# Patient Record
Sex: Female | Born: 1945
Health system: Southern US, Community
[De-identification: ages and names within clinical notes are randomized; demographics above are authoritative.]

## PROBLEM LIST (undated history)

## (undated) DIAGNOSIS — I10 Essential (primary) hypertension: Secondary | ICD-10-CM

## (undated) DIAGNOSIS — R112 Nausea with vomiting, unspecified: Secondary | ICD-10-CM

## (undated) DIAGNOSIS — E119 Type 2 diabetes mellitus without complications: Secondary | ICD-10-CM

## (undated) DIAGNOSIS — N3281 Overactive bladder: Secondary | ICD-10-CM

## (undated) DIAGNOSIS — E039 Hypothyroidism, unspecified: Secondary | ICD-10-CM

## (undated) DIAGNOSIS — E7800 Pure hypercholesterolemia, unspecified: Secondary | ICD-10-CM

## (undated) DIAGNOSIS — Z9889 Other specified postprocedural states: Secondary | ICD-10-CM

## (undated) HISTORY — PX: MASTECTOMY: SHX3

## (undated) HISTORY — PX: FOOT SURGERY: SHX648

## (undated) HISTORY — PX: ABDOMINAL HYSTERECTOMY: SHX81

---

## 1981-10-12 DIAGNOSIS — C801 Malignant (primary) neoplasm, unspecified: Secondary | ICD-10-CM

## 1981-10-12 HISTORY — DX: Malignant (primary) neoplasm, unspecified: C80.1

## 1999-11-26 ENCOUNTER — Other Ambulatory Visit: Admission: RE | Admit: 1999-11-26 | Discharge: 1999-11-26 | Payer: Self-pay | Admitting: Gynecology

## 2000-12-16 ENCOUNTER — Other Ambulatory Visit: Admission: RE | Admit: 2000-12-16 | Discharge: 2000-12-16 | Payer: Self-pay | Admitting: Gynecology

## 2002-06-29 ENCOUNTER — Other Ambulatory Visit: Admission: RE | Admit: 2002-06-29 | Discharge: 2002-06-29 | Payer: Self-pay | Admitting: Gynecology

## 2002-07-05 ENCOUNTER — Encounter: Admission: RE | Admit: 2002-07-05 | Discharge: 2002-10-03 | Payer: Self-pay | Admitting: Internal Medicine

## 2002-07-13 ENCOUNTER — Encounter: Payer: Self-pay | Admitting: Gynecology

## 2002-07-13 ENCOUNTER — Encounter: Admission: RE | Admit: 2002-07-13 | Discharge: 2002-07-13 | Payer: Self-pay | Admitting: Gynecology

## 2002-08-31 ENCOUNTER — Encounter: Admission: RE | Admit: 2002-08-31 | Discharge: 2002-11-29 | Payer: Self-pay | Admitting: Internal Medicine

## 2003-09-10 ENCOUNTER — Encounter: Admission: RE | Admit: 2003-09-10 | Discharge: 2003-12-09 | Payer: Self-pay | Admitting: Internal Medicine

## 2004-11-10 ENCOUNTER — Ambulatory Visit (HOSPITAL_COMMUNITY): Admission: RE | Admit: 2004-11-10 | Discharge: 2004-11-10 | Payer: Self-pay | Admitting: Family Medicine

## 2007-06-06 ENCOUNTER — Ambulatory Visit (HOSPITAL_COMMUNITY): Admission: RE | Admit: 2007-06-06 | Discharge: 2007-06-06 | Payer: Self-pay | Admitting: Family Medicine

## 2008-07-24 ENCOUNTER — Encounter: Admission: RE | Admit: 2008-07-24 | Discharge: 2008-07-24 | Payer: Self-pay | Admitting: Podiatry

## 2008-07-25 ENCOUNTER — Ambulatory Visit (HOSPITAL_BASED_OUTPATIENT_CLINIC_OR_DEPARTMENT_OTHER): Admission: RE | Admit: 2008-07-25 | Discharge: 2008-07-25 | Payer: Self-pay | Admitting: Podiatry

## 2008-08-01 ENCOUNTER — Ambulatory Visit (HOSPITAL_COMMUNITY): Admission: RE | Admit: 2008-08-01 | Discharge: 2008-08-01 | Payer: Self-pay | Admitting: Internal Medicine

## 2009-03-05 ENCOUNTER — Ambulatory Visit (HOSPITAL_COMMUNITY): Admission: RE | Admit: 2009-03-05 | Discharge: 2009-03-05 | Payer: Self-pay | Admitting: Family Medicine

## 2009-03-05 ENCOUNTER — Encounter (INDEPENDENT_AMBULATORY_CARE_PROVIDER_SITE_OTHER): Payer: Self-pay | Admitting: Family Medicine

## 2011-02-24 NOTE — Op Note (Signed)
[name], [name]                   ACCOUNT NO.:  [id number]   MEDICAL RECORD NO.:  [id number]          PATIENT TYPE:  AMB   LOCATION:  DSC                          FACILITY:  [facility]   PHYSICIAN:  Ezequiel Kayser. Ajlouny, D.P.M.DATE OF BIRTH:  1946-05-18   DATE OF PROCEDURE:  07/25/2008  DATE OF DISCHARGE:  07/25/2008                               OPERATIVE REPORT   SURGEON:  Ezequiel Kayser. Harriet Pho, DPM   ASSISTANTS:  1. Myeong O. Sheard, DPM  2. Kara Mead, DPM   PREOPERATIVE DIAGNOSES:  1. Hallux abductovalgus deformity with hypermobility, right foot.  2. Elongated and plantar flexed second metatarsal, right foot.  3. Hammertoe, second digit, right foot with the pre-dislocation      syndrome.   POSTOPERATIVE DIAGNOSES:  1. Hallux abductovalgus deformity with hypermobility, right foot.  2. Elongated and plantar flexed second metatarsal, right foot.  3. Hammertoe, second digit, right foot with the pre-dislocation      syndrome.   PROCEDURES:  1. Bunionectomy with Lapidus, right foot.  2. Second metatarsal Weil osteotomy with internal fixation.  3. Second hammertoe repair, right foot.   COMPLICATIONS:  None.   ANESTHESIA:  LMA.   COMPLICATIONS:  None.   SPECIMENS:  None.   DESCRIPTION OF THE PROCEDURE:  The patient was brought to the OR and  placed in the supine position, at which time general anesthesia was  administered.  A local block was performed.  Well-padded pneumatic ankle  tourniquet was applied superior to the medial malleolus.  The patient  was prepped and draped in the usual aseptic manner.  The foot was  exsanguinated with an Esmarch bandage and the previously applied  tourniquet inflated to 12 mmHg.   Attention was directed to the first ray where a dorsolinear incision was  made.  The incision was deepened via sharp and blunt modalities, taking  care to clip, cauterize all bleeding vessels and ensuring retraction of  all neurovascular structures encountered.  The deep  and superficial  fascia was separated medially and dorsally the length of the incision.  Attention was directed to the head of the first metatarsal where an  inverted L capsulotomy was performed, and once the head of the  metatarsal was exposed, the medial eminence was resected utilizing a  sagittal saw.  There was some mild hypertrophic bone lipping, which was  resected dorsally to the head of the first metatarsal.  There was mild  erosions at the head of the first metatarsal.  Next, attention was  directed to the first interspace where dissection was carried to the  level of the deep transmetatarsal ligament, which was resected and the  fibular sesamoid which preoperatively was in the interspace was resected  and excised in total.  The area was irrigated with copious amounts of  sterile saline and antibiotic solution.  Next, attention was directed to  the metatarsal cuneiform joint where the periosteum and capsule were  reflected.  Once adequate bone exposure had been obtained, the cartilage  was resected minimally off the distal end of the cuneiform and the base  of  the metatarsal to allow for transposition of the metatarsal.  This  was excised and the metatarsal was then realigned and fixated to correct  the increased in the metatarsal angle.  This was fixated using [facility] Lapidus plate and locking screws were used.  Fixation was found  to be adequate.  There were no gaps.  Surgical wound was irrigated with  copious amounts of sterile saline and antibiotic solution.  All rough  edges were smoothed with a power rasp.  Periosteum and capsule were  reapproximated using 3-0 Vicryl at the metatarsal cuneiform level and at  the first metatarsophalangeal joint level.  Deep closure was then  accomplished with 4-0 Vicryl and skin closure accomplished with 4-0  nylon.  Next, attention was directed to the second metatarsal where  incision was made dorsally over the proximal  interphalangeal joint and a  curvilinear incision extending over the metatarsophalangeal joint.  The  incision was deepened via sharp and blunt modalities, taking care to  clip, cauterize all bleeding vessels and ensuring retraction of all  neurovascular structures encountered.  The level of the proximal  interphalangeal joint of the extensor apparatus was released and the  head of the proximal phalanx exposed.  The head of the proximal phalanx  was resected using an oscillating saw and the cartilage off the base of  the medial middle phalanx was resected.  Deformity was still not  reducible and attention was directed to metatarsophalangeal joint where  the incision was extended exposing the head of the second metatarsal.  Next, a second metatarsal shortening osteotomy in an oblique fashion was  performed.  This was to elevate and slightly shorten the second  metatarsal.  Fixation was used using a 2-0 OsteoMed cannulated screw.  Fixation was found to be adequate.  The digit was then stabilized with a  0.045 K-wire just to the metatarsal head.  Intraoperative radiographs  were taken to confirm fixation.  Surgical wound was irrigated numerous  times throughout the procedure with sterile saline.  Deep closure was  accomplished with 3-0 and 4-0 Vicryl and skin closure was accomplished  with 4-0 and 5-0 nylon.  Postoperatively, 0.5% Marcaine plain and  dexamethasone phosphate was infiltrated around the surgical sites.  Foot  was dressed with Xeroform, 4 x 4s, Kling, and Coban.  The tourniquet was  deflated before it reached the 2-hour mark.  While the patient was still  in the recovery room, a short-leg posterior splint was applied.  Once  she was extubated, vital signs were stable and capillary refill time  were to presurgical levels.  No complications.  No guarantee is given.  She was instructed to remain completely nonweightbearing.  Postop  instructions given.  All questions  answered.           ______________________________  Ezequiel Kayser. Harriet Pho, D.P.M.     MJA/MEDQ  D:  08/20/2008  T:  08/20/2008  Job:  [id number]

## 2011-07-14 LAB — BASIC METABOLIC PANEL
BUN: 6
Chloride: 99
Creatinine, Ser: 0.63
GFR calc Af Amer: >60
GFR calc non Af Amer: >60
Glucose, Bld: 117 — ABNORMAL HIGH

## 2011-07-14 LAB — GLUCOSE, CAPILLARY

## 2011-10-21 DIAGNOSIS — H2510 Age-related nuclear cataract, unspecified eye: Secondary | ICD-10-CM | POA: Diagnosis not present

## 2011-10-28 DIAGNOSIS — H2510 Age-related nuclear cataract, unspecified eye: Secondary | ICD-10-CM | POA: Diagnosis not present

## 2011-10-29 DIAGNOSIS — H2510 Age-related nuclear cataract, unspecified eye: Secondary | ICD-10-CM | POA: Diagnosis not present

## 2011-11-04 DIAGNOSIS — H2510 Age-related nuclear cataract, unspecified eye: Secondary | ICD-10-CM | POA: Diagnosis not present

## 2012-01-21 DIAGNOSIS — D485 Neoplasm of uncertain behavior of skin: Secondary | ICD-10-CM | POA: Diagnosis not present

## 2012-01-21 DIAGNOSIS — L259 Unspecified contact dermatitis, unspecified cause: Secondary | ICD-10-CM | POA: Diagnosis not present

## 2012-01-21 DIAGNOSIS — L219 Seborrheic dermatitis, unspecified: Secondary | ICD-10-CM | POA: Diagnosis not present

## 2012-03-01 DIAGNOSIS — Z961 Presence of intraocular lens: Secondary | ICD-10-CM | POA: Diagnosis not present

## 2012-05-20 DIAGNOSIS — E119 Type 2 diabetes mellitus without complications: Secondary | ICD-10-CM | POA: Diagnosis not present

## 2012-05-20 DIAGNOSIS — E785 Hyperlipidemia, unspecified: Secondary | ICD-10-CM | POA: Diagnosis not present

## 2012-05-20 DIAGNOSIS — I10 Essential (primary) hypertension: Secondary | ICD-10-CM | POA: Diagnosis not present

## 2012-05-20 DIAGNOSIS — Z713 Dietary counseling and surveillance: Secondary | ICD-10-CM | POA: Diagnosis not present

## 2012-05-23 DIAGNOSIS — I10 Essential (primary) hypertension: Secondary | ICD-10-CM | POA: Diagnosis not present

## 2012-05-23 DIAGNOSIS — E782 Mixed hyperlipidemia: Secondary | ICD-10-CM | POA: Diagnosis not present

## 2012-08-03 DIAGNOSIS — Z23 Encounter for immunization: Secondary | ICD-10-CM | POA: Diagnosis not present

## 2012-11-14 DIAGNOSIS — N8184 Pelvic muscle wasting: Secondary | ICD-10-CM | POA: Diagnosis not present

## 2012-11-14 DIAGNOSIS — R350 Frequency of micturition: Secondary | ICD-10-CM | POA: Diagnosis not present

## 2012-11-14 DIAGNOSIS — Z01419 Encounter for gynecological examination (general) (routine) without abnormal findings: Secondary | ICD-10-CM | POA: Diagnosis not present

## 2012-12-28 DIAGNOSIS — Z1231 Encounter for screening mammogram for malignant neoplasm of breast: Secondary | ICD-10-CM | POA: Diagnosis not present

## 2013-01-03 DIAGNOSIS — M779 Enthesopathy, unspecified: Secondary | ICD-10-CM | POA: Diagnosis not present

## 2013-01-03 DIAGNOSIS — E1165 Type 2 diabetes mellitus with hyperglycemia: Secondary | ICD-10-CM | POA: Diagnosis not present

## 2013-01-03 DIAGNOSIS — Z6831 Body mass index (BMI) 31.0-31.9, adult: Secondary | ICD-10-CM | POA: Diagnosis not present

## 2013-01-03 DIAGNOSIS — N181 Chronic kidney disease, stage 1: Secondary | ICD-10-CM | POA: Diagnosis not present

## 2013-01-03 DIAGNOSIS — J309 Allergic rhinitis, unspecified: Secondary | ICD-10-CM | POA: Diagnosis not present

## 2013-03-28 DIAGNOSIS — Z961 Presence of intraocular lens: Secondary | ICD-10-CM | POA: Diagnosis not present

## 2013-06-26 DIAGNOSIS — I10 Essential (primary) hypertension: Secondary | ICD-10-CM | POA: Diagnosis not present

## 2013-06-26 DIAGNOSIS — Z6829 Body mass index (BMI) 29.0-29.9, adult: Secondary | ICD-10-CM | POA: Diagnosis not present

## 2013-06-26 DIAGNOSIS — Z23 Encounter for immunization: Secondary | ICD-10-CM | POA: Diagnosis not present

## 2013-06-26 DIAGNOSIS — E785 Hyperlipidemia, unspecified: Secondary | ICD-10-CM | POA: Diagnosis not present

## 2013-06-26 DIAGNOSIS — E119 Type 2 diabetes mellitus without complications: Secondary | ICD-10-CM | POA: Diagnosis not present

## 2013-12-26 DIAGNOSIS — Z6828 Body mass index (BMI) 28.0-28.9, adult: Secondary | ICD-10-CM | POA: Diagnosis not present

## 2013-12-26 DIAGNOSIS — E119 Type 2 diabetes mellitus without complications: Secondary | ICD-10-CM | POA: Diagnosis not present

## 2013-12-26 DIAGNOSIS — F411 Generalized anxiety disorder: Secondary | ICD-10-CM | POA: Diagnosis not present

## 2014-07-27 DIAGNOSIS — Z853 Personal history of malignant neoplasm of breast: Secondary | ICD-10-CM | POA: Diagnosis not present

## 2014-07-27 DIAGNOSIS — Z1231 Encounter for screening mammogram for malignant neoplasm of breast: Secondary | ICD-10-CM | POA: Diagnosis not present

## 2015-02-19 DIAGNOSIS — Z6829 Body mass index (BMI) 29.0-29.9, adult: Secondary | ICD-10-CM | POA: Diagnosis not present

## 2015-02-19 DIAGNOSIS — E782 Mixed hyperlipidemia: Secondary | ICD-10-CM | POA: Diagnosis not present

## 2015-02-19 DIAGNOSIS — E1165 Type 2 diabetes mellitus with hyperglycemia: Secondary | ICD-10-CM | POA: Diagnosis not present

## 2015-02-19 DIAGNOSIS — E669 Obesity, unspecified: Secondary | ICD-10-CM | POA: Diagnosis not present

## 2015-02-19 DIAGNOSIS — I10 Essential (primary) hypertension: Secondary | ICD-10-CM | POA: Diagnosis not present

## 2015-02-19 DIAGNOSIS — E663 Overweight: Secondary | ICD-10-CM | POA: Diagnosis not present

## 2015-05-27 DIAGNOSIS — Z961 Presence of intraocular lens: Secondary | ICD-10-CM | POA: Diagnosis not present

## 2015-05-27 DIAGNOSIS — E1165 Type 2 diabetes mellitus with hyperglycemia: Secondary | ICD-10-CM | POA: Diagnosis not present

## 2015-05-27 DIAGNOSIS — H26493 Other secondary cataract, bilateral: Secondary | ICD-10-CM | POA: Diagnosis not present

## 2015-06-28 DIAGNOSIS — Z961 Presence of intraocular lens: Secondary | ICD-10-CM | POA: Diagnosis not present

## 2015-06-28 DIAGNOSIS — H26493 Other secondary cataract, bilateral: Secondary | ICD-10-CM | POA: Diagnosis not present

## 2015-06-28 DIAGNOSIS — H26491 Other secondary cataract, right eye: Secondary | ICD-10-CM | POA: Diagnosis not present

## 2015-07-09 DIAGNOSIS — E782 Mixed hyperlipidemia: Secondary | ICD-10-CM | POA: Diagnosis not present

## 2015-07-09 DIAGNOSIS — Z6829 Body mass index (BMI) 29.0-29.9, adult: Secondary | ICD-10-CM | POA: Diagnosis not present

## 2015-07-09 DIAGNOSIS — J0100 Acute maxillary sinusitis, unspecified: Secondary | ICD-10-CM | POA: Diagnosis not present

## 2015-07-09 DIAGNOSIS — J209 Acute bronchitis, unspecified: Secondary | ICD-10-CM | POA: Diagnosis not present

## 2015-07-09 DIAGNOSIS — E663 Overweight: Secondary | ICD-10-CM | POA: Diagnosis not present

## 2015-07-09 DIAGNOSIS — Z23 Encounter for immunization: Secondary | ICD-10-CM | POA: Diagnosis not present

## 2015-07-09 DIAGNOSIS — E119 Type 2 diabetes mellitus without complications: Secondary | ICD-10-CM | POA: Diagnosis not present

## 2015-07-09 DIAGNOSIS — Z1389 Encounter for screening for other disorder: Secondary | ICD-10-CM | POA: Diagnosis not present

## 2015-07-09 DIAGNOSIS — I10 Essential (primary) hypertension: Secondary | ICD-10-CM | POA: Diagnosis not present

## 2015-07-09 DIAGNOSIS — E063 Autoimmune thyroiditis: Secondary | ICD-10-CM | POA: Diagnosis not present

## 2015-07-11 ENCOUNTER — Other Ambulatory Visit (HOSPITAL_COMMUNITY): Payer: Self-pay | Admitting: Internal Medicine

## 2015-07-11 DIAGNOSIS — R51 Headache: Principal | ICD-10-CM

## 2015-07-11 DIAGNOSIS — R519 Headache, unspecified: Secondary | ICD-10-CM

## 2015-07-16 ENCOUNTER — Other Ambulatory Visit (HOSPITAL_COMMUNITY): Payer: Self-pay

## 2015-07-16 DIAGNOSIS — H26492 Other secondary cataract, left eye: Secondary | ICD-10-CM | POA: Diagnosis not present

## 2015-07-17 ENCOUNTER — Ambulatory Visit (HOSPITAL_COMMUNITY)
Admission: RE | Admit: 2015-07-17 | Discharge: 2015-07-17 | Disposition: A | Discharge status: Home / Self Care | Payer: Medicare Other | Source: Ambulatory Visit | Attending: Internal Medicine | Admitting: Internal Medicine

## 2015-07-17 DIAGNOSIS — R51 Headache: Secondary | ICD-10-CM | POA: Diagnosis not present

## 2015-07-17 DIAGNOSIS — R519 Headache, unspecified: Secondary | ICD-10-CM

## 2015-09-12 DIAGNOSIS — Z1231 Encounter for screening mammogram for malignant neoplasm of breast: Secondary | ICD-10-CM | POA: Diagnosis not present

## 2015-12-23 DIAGNOSIS — E1165 Type 2 diabetes mellitus with hyperglycemia: Secondary | ICD-10-CM | POA: Diagnosis not present

## 2015-12-23 DIAGNOSIS — N181 Chronic kidney disease, stage 1: Secondary | ICD-10-CM | POA: Diagnosis not present

## 2015-12-23 DIAGNOSIS — I10 Essential (primary) hypertension: Secondary | ICD-10-CM | POA: Diagnosis not present

## 2015-12-23 DIAGNOSIS — E663 Overweight: Secondary | ICD-10-CM | POA: Diagnosis not present

## 2015-12-23 DIAGNOSIS — J029 Acute pharyngitis, unspecified: Secondary | ICD-10-CM | POA: Diagnosis not present

## 2015-12-23 DIAGNOSIS — E063 Autoimmune thyroiditis: Secondary | ICD-10-CM | POA: Diagnosis not present

## 2015-12-23 DIAGNOSIS — Z1389 Encounter for screening for other disorder: Secondary | ICD-10-CM | POA: Diagnosis not present

## 2015-12-23 DIAGNOSIS — E782 Mixed hyperlipidemia: Secondary | ICD-10-CM | POA: Diagnosis not present

## 2015-12-23 DIAGNOSIS — Z6826 Body mass index (BMI) 26.0-26.9, adult: Secondary | ICD-10-CM | POA: Diagnosis not present

## 2016-01-13 DIAGNOSIS — Z0000 Encounter for general adult medical examination without abnormal findings: Secondary | ICD-10-CM | POA: Diagnosis not present

## 2016-01-13 DIAGNOSIS — Z23 Encounter for immunization: Secondary | ICD-10-CM | POA: Diagnosis not present

## 2016-01-13 DIAGNOSIS — E119 Type 2 diabetes mellitus without complications: Secondary | ICD-10-CM | POA: Diagnosis not present

## 2016-01-13 DIAGNOSIS — Z1389 Encounter for screening for other disorder: Secondary | ICD-10-CM | POA: Diagnosis not present

## 2016-01-13 DIAGNOSIS — Z6825 Body mass index (BMI) 25.0-25.9, adult: Secondary | ICD-10-CM | POA: Diagnosis not present

## 2016-01-13 DIAGNOSIS — E663 Overweight: Secondary | ICD-10-CM | POA: Diagnosis not present

## 2016-02-01 DIAGNOSIS — J0100 Acute maxillary sinusitis, unspecified: Secondary | ICD-10-CM | POA: Diagnosis not present

## 2016-02-18 DIAGNOSIS — F329 Major depressive disorder, single episode, unspecified: Secondary | ICD-10-CM | POA: Diagnosis not present

## 2016-02-18 DIAGNOSIS — Z1389 Encounter for screening for other disorder: Secondary | ICD-10-CM | POA: Diagnosis not present

## 2016-02-18 DIAGNOSIS — Z6824 Body mass index (BMI) 24.0-24.9, adult: Secondary | ICD-10-CM | POA: Diagnosis not present

## 2016-02-18 DIAGNOSIS — F419 Anxiety disorder, unspecified: Secondary | ICD-10-CM | POA: Diagnosis not present

## 2016-03-12 DIAGNOSIS — Z1389 Encounter for screening for other disorder: Secondary | ICD-10-CM | POA: Diagnosis not present

## 2016-03-12 DIAGNOSIS — F329 Major depressive disorder, single episode, unspecified: Secondary | ICD-10-CM | POA: Diagnosis not present

## 2016-03-12 DIAGNOSIS — F419 Anxiety disorder, unspecified: Secondary | ICD-10-CM | POA: Diagnosis not present

## 2016-03-12 DIAGNOSIS — Z6824 Body mass index (BMI) 24.0-24.9, adult: Secondary | ICD-10-CM | POA: Diagnosis not present

## 2016-05-13 DIAGNOSIS — E039 Hypothyroidism, unspecified: Secondary | ICD-10-CM | POA: Diagnosis not present

## 2016-05-13 DIAGNOSIS — E1165 Type 2 diabetes mellitus with hyperglycemia: Secondary | ICD-10-CM | POA: Diagnosis not present

## 2016-05-13 DIAGNOSIS — Z6826 Body mass index (BMI) 26.0-26.9, adult: Secondary | ICD-10-CM | POA: Diagnosis not present

## 2016-05-13 DIAGNOSIS — E663 Overweight: Secondary | ICD-10-CM | POA: Diagnosis not present

## 2016-05-13 DIAGNOSIS — Z1389 Encounter for screening for other disorder: Secondary | ICD-10-CM | POA: Diagnosis not present

## 2016-05-13 DIAGNOSIS — E782 Mixed hyperlipidemia: Secondary | ICD-10-CM | POA: Diagnosis not present

## 2016-05-13 DIAGNOSIS — I10 Essential (primary) hypertension: Secondary | ICD-10-CM | POA: Diagnosis not present

## 2016-06-30 DIAGNOSIS — C50119 Malignant neoplasm of central portion of unspecified female breast: Secondary | ICD-10-CM | POA: Diagnosis not present

## 2016-06-30 DIAGNOSIS — Z1389 Encounter for screening for other disorder: Secondary | ICD-10-CM | POA: Diagnosis not present

## 2016-06-30 DIAGNOSIS — Z23 Encounter for immunization: Secondary | ICD-10-CM | POA: Diagnosis not present

## 2016-06-30 DIAGNOSIS — Z6827 Body mass index (BMI) 27.0-27.9, adult: Secondary | ICD-10-CM | POA: Diagnosis not present

## 2016-06-30 DIAGNOSIS — E663 Overweight: Secondary | ICD-10-CM | POA: Diagnosis not present

## 2016-06-30 DIAGNOSIS — E039 Hypothyroidism, unspecified: Secondary | ICD-10-CM | POA: Diagnosis not present

## 2016-07-10 DIAGNOSIS — Z1389 Encounter for screening for other disorder: Secondary | ICD-10-CM | POA: Diagnosis not present

## 2016-07-10 DIAGNOSIS — Z6826 Body mass index (BMI) 26.0-26.9, adult: Secondary | ICD-10-CM | POA: Diagnosis not present

## 2016-07-10 DIAGNOSIS — J209 Acute bronchitis, unspecified: Secondary | ICD-10-CM | POA: Diagnosis not present

## 2016-07-10 DIAGNOSIS — J069 Acute upper respiratory infection, unspecified: Secondary | ICD-10-CM | POA: Diagnosis not present

## 2016-08-03 DIAGNOSIS — H35033 Hypertensive retinopathy, bilateral: Secondary | ICD-10-CM | POA: Diagnosis not present

## 2016-08-03 DIAGNOSIS — Z961 Presence of intraocular lens: Secondary | ICD-10-CM | POA: Diagnosis not present

## 2016-08-03 DIAGNOSIS — H35362 Drusen (degenerative) of macula, left eye: Secondary | ICD-10-CM | POA: Diagnosis not present

## 2016-08-03 DIAGNOSIS — E119 Type 2 diabetes mellitus without complications: Secondary | ICD-10-CM | POA: Diagnosis not present

## 2016-08-31 DIAGNOSIS — C50119 Malignant neoplasm of central portion of unspecified female breast: Secondary | ICD-10-CM | POA: Diagnosis not present

## 2016-08-31 DIAGNOSIS — E039 Hypothyroidism, unspecified: Secondary | ICD-10-CM | POA: Diagnosis not present

## 2016-08-31 DIAGNOSIS — E663 Overweight: Secondary | ICD-10-CM | POA: Diagnosis not present

## 2016-08-31 DIAGNOSIS — Z1389 Encounter for screening for other disorder: Secondary | ICD-10-CM | POA: Diagnosis not present

## 2016-08-31 DIAGNOSIS — E1165 Type 2 diabetes mellitus with hyperglycemia: Secondary | ICD-10-CM | POA: Diagnosis not present

## 2016-08-31 DIAGNOSIS — Z6828 Body mass index (BMI) 28.0-28.9, adult: Secondary | ICD-10-CM | POA: Diagnosis not present

## 2016-08-31 DIAGNOSIS — I10 Essential (primary) hypertension: Secondary | ICD-10-CM | POA: Diagnosis not present

## 2016-08-31 DIAGNOSIS — F419 Anxiety disorder, unspecified: Secondary | ICD-10-CM | POA: Diagnosis not present

## 2016-09-15 DIAGNOSIS — Z1231 Encounter for screening mammogram for malignant neoplasm of breast: Secondary | ICD-10-CM | POA: Diagnosis not present

## 2016-11-01 DIAGNOSIS — J40 Bronchitis, not specified as acute or chronic: Secondary | ICD-10-CM | POA: Diagnosis not present

## 2016-11-01 DIAGNOSIS — R6889 Other general symptoms and signs: Secondary | ICD-10-CM | POA: Diagnosis not present

## 2016-12-30 DIAGNOSIS — E1165 Type 2 diabetes mellitus with hyperglycemia: Secondary | ICD-10-CM | POA: Diagnosis not present

## 2016-12-30 DIAGNOSIS — Z6829 Body mass index (BMI) 29.0-29.9, adult: Secondary | ICD-10-CM | POA: Diagnosis not present

## 2016-12-30 DIAGNOSIS — E039 Hypothyroidism, unspecified: Secondary | ICD-10-CM | POA: Diagnosis not present

## 2016-12-30 DIAGNOSIS — Z1389 Encounter for screening for other disorder: Secondary | ICD-10-CM | POA: Diagnosis not present

## 2016-12-30 DIAGNOSIS — E663 Overweight: Secondary | ICD-10-CM | POA: Diagnosis not present

## 2017-02-17 DIAGNOSIS — R070 Pain in throat: Secondary | ICD-10-CM | POA: Diagnosis not present

## 2017-02-17 DIAGNOSIS — J343 Hypertrophy of nasal turbinates: Secondary | ICD-10-CM | POA: Diagnosis not present

## 2017-02-17 DIAGNOSIS — J302 Other seasonal allergic rhinitis: Secondary | ICD-10-CM | POA: Diagnosis not present

## 2017-02-17 DIAGNOSIS — Z6830 Body mass index (BMI) 30.0-30.9, adult: Secondary | ICD-10-CM | POA: Diagnosis not present

## 2017-04-06 DIAGNOSIS — Z6829 Body mass index (BMI) 29.0-29.9, adult: Secondary | ICD-10-CM | POA: Diagnosis not present

## 2017-04-06 DIAGNOSIS — Z1389 Encounter for screening for other disorder: Secondary | ICD-10-CM | POA: Diagnosis not present

## 2017-04-06 DIAGNOSIS — Z0000 Encounter for general adult medical examination without abnormal findings: Secondary | ICD-10-CM | POA: Diagnosis not present

## 2017-05-25 DIAGNOSIS — I10 Essential (primary) hypertension: Secondary | ICD-10-CM | POA: Diagnosis not present

## 2017-05-25 DIAGNOSIS — E039 Hypothyroidism, unspecified: Secondary | ICD-10-CM | POA: Diagnosis not present

## 2017-05-25 DIAGNOSIS — Z6828 Body mass index (BMI) 28.0-28.9, adult: Secondary | ICD-10-CM | POA: Diagnosis not present

## 2017-05-25 DIAGNOSIS — E782 Mixed hyperlipidemia: Secondary | ICD-10-CM | POA: Diagnosis not present

## 2017-05-25 DIAGNOSIS — E663 Overweight: Secondary | ICD-10-CM | POA: Diagnosis not present

## 2017-05-25 DIAGNOSIS — F419 Anxiety disorder, unspecified: Secondary | ICD-10-CM | POA: Diagnosis not present

## 2017-05-25 DIAGNOSIS — E1165 Type 2 diabetes mellitus with hyperglycemia: Secondary | ICD-10-CM | POA: Diagnosis not present

## 2017-08-20 DIAGNOSIS — Z23 Encounter for immunization: Secondary | ICD-10-CM | POA: Diagnosis not present

## 2017-08-30 DIAGNOSIS — H35033 Hypertensive retinopathy, bilateral: Secondary | ICD-10-CM | POA: Diagnosis not present

## 2017-08-30 DIAGNOSIS — E119 Type 2 diabetes mellitus without complications: Secondary | ICD-10-CM | POA: Diagnosis not present

## 2017-08-30 DIAGNOSIS — E1165 Type 2 diabetes mellitus with hyperglycemia: Secondary | ICD-10-CM | POA: Diagnosis not present

## 2017-08-30 DIAGNOSIS — H35362 Drusen (degenerative) of macula, left eye: Secondary | ICD-10-CM | POA: Diagnosis not present

## 2017-08-30 DIAGNOSIS — H35371 Puckering of macula, right eye: Secondary | ICD-10-CM | POA: Diagnosis not present

## 2017-09-16 DIAGNOSIS — Z853 Personal history of malignant neoplasm of breast: Secondary | ICD-10-CM | POA: Diagnosis not present

## 2017-09-16 DIAGNOSIS — Z1231 Encounter for screening mammogram for malignant neoplasm of breast: Secondary | ICD-10-CM | POA: Diagnosis not present

## 2017-10-19 DIAGNOSIS — E1165 Type 2 diabetes mellitus with hyperglycemia: Secondary | ICD-10-CM | POA: Diagnosis not present

## 2017-10-19 DIAGNOSIS — E782 Mixed hyperlipidemia: Secondary | ICD-10-CM | POA: Diagnosis not present

## 2017-10-19 DIAGNOSIS — I10 Essential (primary) hypertension: Secondary | ICD-10-CM | POA: Diagnosis not present

## 2017-10-19 DIAGNOSIS — E039 Hypothyroidism, unspecified: Secondary | ICD-10-CM | POA: Diagnosis not present

## 2017-10-19 DIAGNOSIS — Z1389 Encounter for screening for other disorder: Secondary | ICD-10-CM | POA: Diagnosis not present

## 2017-10-19 DIAGNOSIS — Z6829 Body mass index (BMI) 29.0-29.9, adult: Secondary | ICD-10-CM | POA: Diagnosis not present

## 2017-10-19 DIAGNOSIS — E663 Overweight: Secondary | ICD-10-CM | POA: Diagnosis not present

## 2017-11-19 DIAGNOSIS — M1991 Primary osteoarthritis, unspecified site: Secondary | ICD-10-CM | POA: Diagnosis not present

## 2017-11-19 DIAGNOSIS — E663 Overweight: Secondary | ICD-10-CM | POA: Diagnosis not present

## 2017-11-19 DIAGNOSIS — Z6828 Body mass index (BMI) 28.0-28.9, adult: Secondary | ICD-10-CM | POA: Diagnosis not present

## 2017-11-19 DIAGNOSIS — M25561 Pain in right knee: Secondary | ICD-10-CM | POA: Diagnosis not present

## 2018-01-21 ENCOUNTER — Other Ambulatory Visit (HOSPITAL_COMMUNITY): Payer: Self-pay | Admitting: Family Medicine

## 2018-01-21 ENCOUNTER — Ambulatory Visit (HOSPITAL_COMMUNITY)
Admission: RE | Admit: 2018-01-21 | Discharge: 2018-01-21 | Disposition: A | Discharge status: Home / Self Care | Payer: Medicare Other | Source: Ambulatory Visit | Attending: Family Medicine | Admitting: Family Medicine

## 2018-01-21 DIAGNOSIS — M25561 Pain in right knee: Secondary | ICD-10-CM | POA: Diagnosis not present

## 2018-01-21 DIAGNOSIS — I10 Essential (primary) hypertension: Secondary | ICD-10-CM | POA: Insufficient documentation

## 2018-01-21 DIAGNOSIS — E1165 Type 2 diabetes mellitus with hyperglycemia: Secondary | ICD-10-CM | POA: Diagnosis not present

## 2018-01-21 DIAGNOSIS — Z1389 Encounter for screening for other disorder: Secondary | ICD-10-CM | POA: Diagnosis not present

## 2018-01-21 DIAGNOSIS — R109 Unspecified abdominal pain: Secondary | ICD-10-CM | POA: Insufficient documentation

## 2018-01-21 DIAGNOSIS — Z6829 Body mass index (BMI) 29.0-29.9, adult: Secondary | ICD-10-CM | POA: Diagnosis not present

## 2018-01-21 DIAGNOSIS — K224 Dyskinesia of esophagus: Secondary | ICD-10-CM | POA: Insufficient documentation

## 2018-01-27 DIAGNOSIS — M2031 Hallux varus (acquired), right foot: Secondary | ICD-10-CM | POA: Diagnosis not present

## 2018-01-31 ENCOUNTER — Other Ambulatory Visit: Payer: Self-pay

## 2018-01-31 ENCOUNTER — Ambulatory Visit (INDEPENDENT_AMBULATORY_CARE_PROVIDER_SITE_OTHER): Payer: Medicare Other | Admitting: Podiatry

## 2018-01-31 ENCOUNTER — Other Ambulatory Visit: Payer: Self-pay | Admitting: Podiatry

## 2018-01-31 ENCOUNTER — Ambulatory Visit (INDEPENDENT_AMBULATORY_CARE_PROVIDER_SITE_OTHER): Payer: Medicare Other

## 2018-01-31 DIAGNOSIS — M2031 Hallux varus (acquired), right foot: Secondary | ICD-10-CM

## 2018-01-31 DIAGNOSIS — M205X1 Other deformities of toe(s) (acquired), right foot: Secondary | ICD-10-CM | POA: Diagnosis not present

## 2018-01-31 DIAGNOSIS — M25871 Other specified joint disorders, right ankle and foot: Secondary | ICD-10-CM

## 2018-01-31 DIAGNOSIS — R52 Pain, unspecified: Secondary | ICD-10-CM

## 2018-01-31 NOTE — Patient Instructions (Signed)
Pre-Operative Instructions  Congratulations, you have decided to take an important step towards improving your quality of life.  You can be assured that the doctors and staff at [facility] will be with you every step of the way.  Here are some important things you should know:  1. Plan to be at the surgery center/hospital at least 1 (one) hour prior to your scheduled time, unless otherwise directed by the surgical center/hospital staff.  You must have a responsible adult accompany you, remain during the surgery and drive you home.  Make sure you have directions to the surgical center/hospital to ensure you arrive on time. 2. If you are having surgery at [facility] or [facility], you will need a copy of your medical history and physical form from your family physician within one month prior to the date of surgery. We will give you a form for your primary physician to complete.  3. We make every effort to accommodate the date you request for surgery.  However, there are times where surgery dates or times have to be moved.  We will contact you as soon as possible if a change in schedule is required.   4. No aspirin/ibuprofen for one week before surgery.  If you are on aspirin, any non-steroidal anti-inflammatory medications (Mobic, Aleve, Ibuprofen) should not be taken seven (7) days prior to your surgery.  You make take Tylenol for pain prior to surgery.  5. Medications - If you are taking daily heart and blood pressure medications, seizure, reflux, allergy, asthma, anxiety, pain or diabetes medications, make sure you notify the surgery center/hospital before the day of surgery so they can tell you which medications you should take or avoid the day of surgery. 6. No food or drink after midnight the night before surgery unless directed otherwise by surgical center/hospital staff. 7. No alcoholic beverages 24-hours prior to surgery.  No smoking 24-hours prior or 24-hours after  surgery. 8. Wear loose pants or shorts. They should be loose enough to fit over bandages, boots, and casts. 9. Don't wear slip-on shoes. Sneakers are preferred. 10. Bring your boot with you to the surgery center/hospital.  Also bring crutches or a walker if your physician has prescribed it for you.  If you do not have this equipment, it will be provided for you after surgery. 11. If you have not been contacted by the surgery center/hospital by the day before your surgery, call to confirm the date and time of your surgery. 12. Leave-time from work may vary depending on the type of surgery you have.  Appropriate arrangements should be made prior to surgery with your employer. 13. Prescriptions will be provided immediately following surgery by your doctor.  Fill these as soon as possible after surgery and take the medication as directed. Pain medications will not be refilled on weekends and must be approved by the doctor. 14. Remove nail polish on the operative foot and avoid getting pedicures prior to surgery. 15. Wash the night before surgery.  The night before surgery wash the foot and leg well with water and the antibacterial soap provided. Be sure to pay special attention to beneath the toenails and in between the toes.  Wash for at least three (3) minutes. Rinse thoroughly with water and dry well with a towel.  Perform this wash unless told not to do so by your physician.  Enclosed: 1 Ice pack (please put in freezer the night before surgery)   1 Hibiclens skin cleaner     Pre-op instructions  If you have any questions regarding the instructions, please do not hesitate to call our office.  [place]: [address], [place], [place] [address] -- [phone]  [place]: [address] [place], [place] [address] -- [phone]  [place]: [address]  [place], [place] [address] -- [phone]  [place]: [address], [place], [place] [address] -- [phone]  Website:  [email or web address] 

## 2018-01-31 NOTE — Progress Notes (Signed)
   HPI: 72 year old female referral from Dr. [facility] presents today for surgical consultation regarding an acquired hallux varus to the right foot.  Patient had a bunionectomy procedure performed to the right foot approximately 10 years ago by another local podiatrist.  She subsequently developed a right hallux varus deformity that is symptomatic and painful with certain shoe gear.  She also has deviation of digits 2 and 3 of the right foot.  She presents today for surgical consultation.  Conservative modalities are unsuccessful in providing any sort of satisfactory alleviation of symptoms and the patient  No past medical history on file.   Physical Exam: General: The patient is alert and oriented x3 in no acute distress.  Dermatology: Skin is warm, dry and supple bilateral lower extremities. Negative for open lesions or macerations.  Vascular: Palpable pedal pulses bilaterally. No edema or erythema noted. Capillary refill within normal limits.  Neurological: Epicritic and protective threshold grossly intact bilaterally.   Musculoskeletal Exam: Reducible hallux varus deformity noted with medial deviation of the great toe in relationship to the metatarsal at the level of the metatarsal phalangeal joint.  There is also medial deviation of digits 2 and 3 right foot with the apex of the deformity at the level of the MPJ.  Range of motion within normal limits to all pedal and ankle joints bilateral with exception of limited range of motion of the first MPJ.. Muscle strength 5/5 in all groups bilateral.   Radiographic Exam:  Normal osseous mineralization.  Medial deviation of the proximal phalanx in relationship to the metatarsal noted to the first second and third MTPJ's.  No significant osseous degeneration is noted. There also appears to be surgical excision or absence of the fibular sesamoid, which likely contributed to the hallux varus deformity.  Assessment: 1.  Reducible hallux  varus deformity right 2.  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 Patient evaluated. X-Rays reviewed.  2. Today we discussed the conservative versus surgical management of the presenting pathology. The patient opts for surgical management. All possible complications and details of the procedure were explained. All patient questions were answered. No guarantees were expressed or implied. 3. Authorization for surgery was initiated today. Surgery will consist of first MPJ arthroplasty with Silastic implant right.  2nd and 3rd MPJ capsulotomy right.  Possible R.O.H. Right 2nd metatarsal. Possible Weil decompression osteotomy 2nd and 3rd metatarsals RT.  4.  Return to clinic 1 week postop       Edrick Kins, DPM [facility]  Dr. Edrick Kins, DPM    [address], [place] [address]                Office [phone]  Fax [phone]

## 2018-02-02 ENCOUNTER — Telehealth: Payer: Self-pay | Admitting: *Deleted

## 2018-02-02 NOTE — Telephone Encounter (Signed)
I left the patient a message to call me back.  I need to change her surgery date, 02/24/2018 is not a good date for surgery.

## 2018-02-03 NOTE — Telephone Encounter (Signed)
"  I'm returning your call."  Yes, I called to let you know the date you were given for surgery is incorrect.   Dr. Amalia Hailey does not have enough time to do your surgery on 02/24/2018.  Is there another date we can reschedule it to?  "I can do it on May 2 or May 30."  He can do it on May 30.  Thank you so much.

## 2018-02-04 ENCOUNTER — Telehealth: Payer: Self-pay | Admitting: *Deleted

## 2018-02-04 NOTE — Telephone Encounter (Signed)
"  My mom, [name], is scheduled for surgery on May 30.  I need to change that because I will be the one transporting her.  I have a busy schedule with my kids on that day.  So does he have any time earlier in the month?"  He can do it on May 23 but that is the only date.  "Okay, go ahead and put her down for then."  I will take care of it.  I called Caren Griffins at [facility] to reschedule her surgery from 03/10/2018 to 03/03/2018.

## 2018-02-07 ENCOUNTER — Ambulatory Visit: Payer: Medicare Other | Admitting: Podiatry

## 2018-03-03 ENCOUNTER — Encounter: Payer: Self-pay | Admitting: Podiatry

## 2018-03-03 DIAGNOSIS — M7751 Other enthesopathy of right foot: Secondary | ICD-10-CM | POA: Diagnosis not present

## 2018-03-03 DIAGNOSIS — M2041 Other hammer toe(s) (acquired), right foot: Secondary | ICD-10-CM | POA: Diagnosis not present

## 2018-03-03 DIAGNOSIS — M25571 Pain in right ankle and joints of right foot: Secondary | ICD-10-CM | POA: Diagnosis not present

## 2018-03-03 DIAGNOSIS — M2011 Hallux valgus (acquired), right foot: Secondary | ICD-10-CM | POA: Diagnosis not present

## 2018-03-03 DIAGNOSIS — E7800 Pure hypercholesterolemia, unspecified: Secondary | ICD-10-CM | POA: Diagnosis not present

## 2018-03-04 ENCOUNTER — Encounter: Payer: Self-pay | Admitting: Podiatry

## 2018-03-09 ENCOUNTER — Ambulatory Visit (INDEPENDENT_AMBULATORY_CARE_PROVIDER_SITE_OTHER): Payer: Medicare Other | Admitting: Podiatry

## 2018-03-09 ENCOUNTER — Other Ambulatory Visit: Payer: Self-pay

## 2018-03-09 ENCOUNTER — Ambulatory Visit (INDEPENDENT_AMBULATORY_CARE_PROVIDER_SITE_OTHER): Payer: Medicare Other

## 2018-03-09 DIAGNOSIS — M7751 Other enthesopathy of right foot: Secondary | ICD-10-CM | POA: Diagnosis not present

## 2018-03-09 DIAGNOSIS — Z9889 Other specified postprocedural states: Secondary | ICD-10-CM

## 2018-03-13 NOTE — Progress Notes (Signed)
   Subjective:  Patient presents today status post 1st MPJ arthroplasty with implant and hammertoe repair of digits 2 and 3 of the right foot. DOS: 03/03/18. She states she is doing very well. She denies any pain or other complaints. Patient is here for further evaluation and treatment.    No past medical history on file.    Objective/Physical Exam Neurovascular status intact.  Skin incisions appear to be well coapted with sutures and staples intact. No sign of infectious process noted. No dehiscence. No active bleeding noted. Moderate edema noted to the surgical extremity.  Radiographic Exam:  Orthopedic hardware and osteotomies sites appear to be stable with routine healing.  Assessment: 1. s/p 1st MPJ arthroplasty with implant and hammertoe repair of digits 2 and 3 of the right foot. DOS: 03/03/18.   Plan of Care:  1. Patient was evaluated. X-rays reviewed 2. Dressing changed. Keep clean, dry and intact.  3. Continue weightbearing in CAM boot.  4. Return to clinic in one week.    Edrick Kins, DPM [facility]  Dr. Edrick Kins, [place]                                        [place], [place] [address]                Office [phone]  Fax [phone]

## 2018-03-16 ENCOUNTER — Ambulatory Visit (INDEPENDENT_AMBULATORY_CARE_PROVIDER_SITE_OTHER): Payer: Medicare Other | Admitting: Podiatry

## 2018-03-16 DIAGNOSIS — M2031 Hallux varus (acquired), right foot: Secondary | ICD-10-CM | POA: Diagnosis not present

## 2018-03-16 DIAGNOSIS — Z9889 Other specified postprocedural states: Secondary | ICD-10-CM

## 2018-03-20 NOTE — Progress Notes (Signed)
   Subjective:  Patient presents today status post 1st MPJ arthroplasty with implant and hammertoe repair of digits 2 and 3 of the right foot. DOS: 03/03/18. She states she is doing very well. She reports only minimal pain. She denies any other complaints at this time. Patient is here for further evaluation and treatment.    No past medical history on file.    Objective/Physical Exam Neurovascular status intact.  Skin incisions appear to be well coapted with sutures and staples intact. No sign of infectious process noted. No dehiscence. No active bleeding noted. Moderate edema noted to the surgical extremity.   Assessment: 1. s/p 1st MPJ arthroplasty with implant and hammertoe repair of digits 2 and 3 of the right foot. DOS: 03/03/18.   Plan of Care:  1. Patient was evaluated.  2. Staples removed.  3. Post op shoe dispensed. Discontinue wearing CAM boot.  4. Compression anklet dispensed.  5. Return to clinic in 2 weeks.    Edrick Kins, DPM [facility]  Dr. Edrick Kins, [place]                                        [place], [place] [address]                Office [phone]  Fax [phone]

## 2018-03-25 NOTE — Progress Notes (Signed)
DOS  03/04/2018  Keller bunion implant right. Capsulotomy MPJ release joint two and three right. Removal fixation deep kwire/screw right

## 2018-03-30 ENCOUNTER — Encounter: Payer: Medicare Other | Admitting: Podiatry

## 2018-04-01 DIAGNOSIS — Z6829 Body mass index (BMI) 29.0-29.9, adult: Secondary | ICD-10-CM | POA: Diagnosis not present

## 2018-04-01 DIAGNOSIS — Z1389 Encounter for screening for other disorder: Secondary | ICD-10-CM | POA: Diagnosis not present

## 2018-04-01 DIAGNOSIS — E663 Overweight: Secondary | ICD-10-CM | POA: Diagnosis not present

## 2018-04-01 DIAGNOSIS — Z0001 Encounter for general adult medical examination with abnormal findings: Secondary | ICD-10-CM | POA: Diagnosis not present

## 2018-04-04 ENCOUNTER — Ambulatory Visit (INDEPENDENT_AMBULATORY_CARE_PROVIDER_SITE_OTHER): Payer: Medicare Other

## 2018-04-04 ENCOUNTER — Ambulatory Visit (INDEPENDENT_AMBULATORY_CARE_PROVIDER_SITE_OTHER): Payer: Medicare Other | Admitting: Podiatry

## 2018-04-04 DIAGNOSIS — M2031 Hallux varus (acquired), right foot: Secondary | ICD-10-CM

## 2018-04-04 DIAGNOSIS — R1310 Dysphagia, unspecified: Secondary | ICD-10-CM | POA: Diagnosis not present

## 2018-04-04 DIAGNOSIS — Z9889 Other specified postprocedural states: Secondary | ICD-10-CM

## 2018-04-04 DIAGNOSIS — R0789 Other chest pain: Secondary | ICD-10-CM | POA: Diagnosis not present

## 2018-04-04 DIAGNOSIS — Z86010 Personal history of colonic polyps: Secondary | ICD-10-CM | POA: Insufficient documentation

## 2018-04-04 DIAGNOSIS — E119 Type 2 diabetes mellitus without complications: Secondary | ICD-10-CM | POA: Insufficient documentation

## 2018-04-04 DIAGNOSIS — E785 Hyperlipidemia, unspecified: Secondary | ICD-10-CM | POA: Insufficient documentation

## 2018-04-04 DIAGNOSIS — E039 Hypothyroidism, unspecified: Secondary | ICD-10-CM | POA: Insufficient documentation

## 2018-04-08 NOTE — Progress Notes (Signed)
   Subjective:  Patient presents today status post 1st MPJ arthroplasty with implant and hammertoe repair of digits 2 and 3 of the right foot. DOS: 03/03/18. She states she is improving. She has been wearing the post op shoe which helps with her symptoms. There are no modifying factors noted. Patient is here for further evaluation and treatment.    No past medical history on file.    Objective/Physical Exam Neurovascular status intact.  Skin incisions appear to be well coapted. No sign of infectious process noted. No dehiscence. No active bleeding noted. Moderate edema noted to the surgical extremity.  Radiographic Exam:  Orthopedic hardware and osteotomies sites appear to be stable with routine healing.  Assessment: 1. s/p 1st MPJ arthroplasty with implant and hammertoe repair of digits 2 and 3 of the right foot. DOS: 03/03/18.   Plan of Care:  1. Patient was evaluated. X-Rays reviewed.   2. Resume wearing good shoe gear.  3. Discontinue using post op shoe.  4. Return to clinic in 4 weeks.    Edrick Kins, DPM [facility]  Dr. Edrick Kins, [place]                                        [place], [place] [address]                Office [phone]  Fax [phone]

## 2018-04-28 DIAGNOSIS — Z1211 Encounter for screening for malignant neoplasm of colon: Secondary | ICD-10-CM | POA: Diagnosis not present

## 2018-04-28 DIAGNOSIS — K317 Polyp of stomach and duodenum: Secondary | ICD-10-CM | POA: Diagnosis not present

## 2018-04-28 DIAGNOSIS — K449 Diaphragmatic hernia without obstruction or gangrene: Secondary | ICD-10-CM | POA: Diagnosis not present

## 2018-04-28 DIAGNOSIS — R1310 Dysphagia, unspecified: Secondary | ICD-10-CM | POA: Diagnosis not present

## 2018-04-28 DIAGNOSIS — K228 Other specified diseases of esophagus: Secondary | ICD-10-CM | POA: Diagnosis not present

## 2018-04-28 DIAGNOSIS — D125 Benign neoplasm of sigmoid colon: Secondary | ICD-10-CM | POA: Diagnosis not present

## 2018-04-28 DIAGNOSIS — R0789 Other chest pain: Secondary | ICD-10-CM | POA: Diagnosis not present

## 2018-04-28 DIAGNOSIS — K5730 Diverticulosis of large intestine without perforation or abscess without bleeding: Secondary | ICD-10-CM | POA: Diagnosis not present

## 2018-04-28 DIAGNOSIS — K5790 Diverticulosis of intestine, part unspecified, without perforation or abscess without bleeding: Secondary | ICD-10-CM | POA: Insufficient documentation

## 2018-04-28 DIAGNOSIS — K648 Other hemorrhoids: Secondary | ICD-10-CM | POA: Insufficient documentation

## 2018-04-28 DIAGNOSIS — K219 Gastro-esophageal reflux disease without esophagitis: Secondary | ICD-10-CM | POA: Diagnosis not present

## 2018-04-28 DIAGNOSIS — K3189 Other diseases of stomach and duodenum: Secondary | ICD-10-CM | POA: Diagnosis not present

## 2018-04-28 DIAGNOSIS — D131 Benign neoplasm of stomach: Secondary | ICD-10-CM | POA: Diagnosis not present

## 2018-04-28 DIAGNOSIS — K210 Gastro-esophageal reflux disease with esophagitis: Secondary | ICD-10-CM | POA: Diagnosis not present

## 2018-04-28 DIAGNOSIS — Z86010 Personal history of colonic polyps: Secondary | ICD-10-CM | POA: Diagnosis not present

## 2018-04-28 DIAGNOSIS — D1339 Benign neoplasm of other parts of small intestine: Secondary | ICD-10-CM | POA: Diagnosis not present

## 2018-04-28 DIAGNOSIS — K635 Polyp of colon: Secondary | ICD-10-CM | POA: Diagnosis not present

## 2018-04-28 DIAGNOSIS — K209 Esophagitis, unspecified: Secondary | ICD-10-CM | POA: Diagnosis not present

## 2018-05-04 ENCOUNTER — Ambulatory Visit (INDEPENDENT_AMBULATORY_CARE_PROVIDER_SITE_OTHER): Payer: Medicare Other

## 2018-05-04 ENCOUNTER — Ambulatory Visit (INDEPENDENT_AMBULATORY_CARE_PROVIDER_SITE_OTHER): Payer: Medicare Other | Admitting: Podiatry

## 2018-05-04 DIAGNOSIS — M2031 Hallux varus (acquired), right foot: Secondary | ICD-10-CM

## 2018-05-04 DIAGNOSIS — L603 Nail dystrophy: Secondary | ICD-10-CM

## 2018-05-04 DIAGNOSIS — Z9889 Other specified postprocedural states: Secondary | ICD-10-CM

## 2018-05-07 NOTE — Progress Notes (Signed)
   Subjective:  Patient presents today status post 1st MPJ arthroplasty with implant and hammertoe repair of digits 2 and 3 of the right foot. DOS: 03/03/18. She states she is doing very well regarding her surgery. She has a new complaint of yellowing of the right great toenail. She denies pain or modifying factors. She is concerned for possible nail fungus. She has not done anything for treatment. Patient is here for further evaluation and treatment.    No past medical history on file.    Objective/Physical Exam Neurovascular status intact.  Skin incisions appear to be well coapted. No sign of infectious process noted. No dehiscence. No active bleeding noted. Moderate edema noted to the surgical extremity. Hyperkeratotic, discolored, thickened, onychodystrophy of the right great toenail.   Radiographic Exam:  Orthopedic hardware and osteotomies sites appear to be stable with routine healing.  Assessment: 1. s/p 1st MPJ arthroplasty with implant and hammertoe repair of digits 2 and 3 of the right foot. DOS: 03/03/18. 2. Dystrophic nail right hallux   Plan of Care:  1. Patient was evaluated. X-Rays reviewed.   2. May resume full activity with no restrictions.  3. Mechanical debridement of right great toenail performed using a nail nipper. Filed with dremel without incident.  4. Recommended good shoe gear.  5. Return to clinic as needed.    Edrick Kins, DPM [facility]  Dr. Edrick Kins, [address]                                        [place], [place] [address]                Office [phone]  Fax [phone]

## 2018-06-01 DIAGNOSIS — K2100 Gastro-esophageal reflux disease with esophagitis, without bleeding: Secondary | ICD-10-CM | POA: Insufficient documentation

## 2018-06-01 DIAGNOSIS — K648 Other hemorrhoids: Secondary | ICD-10-CM | POA: Diagnosis not present

## 2018-06-22 DIAGNOSIS — K648 Other hemorrhoids: Secondary | ICD-10-CM | POA: Diagnosis not present

## 2018-07-15 DIAGNOSIS — Z23 Encounter for immunization: Secondary | ICD-10-CM | POA: Diagnosis not present

## 2018-07-15 DIAGNOSIS — E039 Hypothyroidism, unspecified: Secondary | ICD-10-CM | POA: Diagnosis not present

## 2018-07-15 DIAGNOSIS — R32 Unspecified urinary incontinence: Secondary | ICD-10-CM | POA: Diagnosis not present

## 2018-07-15 DIAGNOSIS — I10 Essential (primary) hypertension: Secondary | ICD-10-CM | POA: Diagnosis not present

## 2018-07-15 DIAGNOSIS — E1165 Type 2 diabetes mellitus with hyperglycemia: Secondary | ICD-10-CM | POA: Diagnosis not present

## 2018-07-15 DIAGNOSIS — R350 Frequency of micturition: Secondary | ICD-10-CM | POA: Diagnosis not present

## 2018-07-15 DIAGNOSIS — Z1389 Encounter for screening for other disorder: Secondary | ICD-10-CM | POA: Diagnosis not present

## 2018-07-15 DIAGNOSIS — E782 Mixed hyperlipidemia: Secondary | ICD-10-CM | POA: Diagnosis not present

## 2018-07-15 DIAGNOSIS — Z6828 Body mass index (BMI) 28.0-28.9, adult: Secondary | ICD-10-CM | POA: Diagnosis not present

## 2018-07-15 DIAGNOSIS — M1711 Unilateral primary osteoarthritis, right knee: Secondary | ICD-10-CM | POA: Diagnosis not present

## 2018-07-15 DIAGNOSIS — E119 Type 2 diabetes mellitus without complications: Secondary | ICD-10-CM | POA: Diagnosis not present

## 2018-07-29 DIAGNOSIS — E663 Overweight: Secondary | ICD-10-CM | POA: Diagnosis not present

## 2018-07-29 DIAGNOSIS — J069 Acute upper respiratory infection, unspecified: Secondary | ICD-10-CM | POA: Diagnosis not present

## 2018-07-29 DIAGNOSIS — Z6828 Body mass index (BMI) 28.0-28.9, adult: Secondary | ICD-10-CM | POA: Diagnosis not present

## 2018-08-20 ENCOUNTER — Other Ambulatory Visit: Payer: Self-pay | Admitting: Podiatry

## 2018-09-12 DIAGNOSIS — H35033 Hypertensive retinopathy, bilateral: Secondary | ICD-10-CM | POA: Diagnosis not present

## 2018-09-12 DIAGNOSIS — H35363 Drusen (degenerative) of macula, bilateral: Secondary | ICD-10-CM | POA: Diagnosis not present

## 2018-09-12 DIAGNOSIS — H35371 Puckering of macula, right eye: Secondary | ICD-10-CM | POA: Diagnosis not present

## 2018-09-12 DIAGNOSIS — E119 Type 2 diabetes mellitus without complications: Secondary | ICD-10-CM | POA: Diagnosis not present

## 2018-11-07 DIAGNOSIS — N3941 Urge incontinence: Secondary | ICD-10-CM | POA: Diagnosis not present

## 2018-11-11 DIAGNOSIS — Z1231 Encounter for screening mammogram for malignant neoplasm of breast: Secondary | ICD-10-CM | POA: Diagnosis not present

## 2018-11-11 DIAGNOSIS — Z853 Personal history of malignant neoplasm of breast: Secondary | ICD-10-CM | POA: Diagnosis not present

## 2018-12-02 DIAGNOSIS — Z6826 Body mass index (BMI) 26.0-26.9, adult: Secondary | ICD-10-CM | POA: Diagnosis not present

## 2018-12-02 DIAGNOSIS — J00 Acute nasopharyngitis [common cold]: Secondary | ICD-10-CM | POA: Diagnosis not present

## 2018-12-16 DIAGNOSIS — N3941 Urge incontinence: Secondary | ICD-10-CM | POA: Diagnosis not present

## 2019-01-18 ENCOUNTER — Ambulatory Visit (INDEPENDENT_AMBULATORY_CARE_PROVIDER_SITE_OTHER): Payer: Medicare Other

## 2019-01-18 ENCOUNTER — Ambulatory Visit (INDEPENDENT_AMBULATORY_CARE_PROVIDER_SITE_OTHER): Payer: Medicare Other | Admitting: Podiatry

## 2019-01-18 ENCOUNTER — Other Ambulatory Visit: Payer: Self-pay

## 2019-01-18 DIAGNOSIS — M2041 Other hammer toe(s) (acquired), right foot: Secondary | ICD-10-CM | POA: Diagnosis not present

## 2019-01-18 DIAGNOSIS — M205X1 Other deformities of toe(s) (acquired), right foot: Secondary | ICD-10-CM | POA: Diagnosis not present

## 2019-01-18 DIAGNOSIS — M21271 Flexion deformity, right ankle and toes: Secondary | ICD-10-CM

## 2019-01-18 NOTE — Progress Notes (Signed)
   HPI: Patient presents for evaluation of right foot pain.  States that her second toe drifts underneath her right big toe.  Patient had surgery on 03/03/2017 at which time she underwent first MPJ arthroplasty with implant and hammertoe repair digits 2, 3 right.  She says that now her second toes drifting underneath her great toe and causing pain.  She presents for further treatment evaluation  No past medical history on file.   Physical Exam: General: The patient is alert and oriented x3 in no acute distress.  Dermatology: Skin is warm, dry and supple bilateral lower extremities. Negative for open lesions or macerations.  Vascular: Palpable pedal pulses bilaterally. No edema or erythema noted. Capillary refill within normal limits.  Neurological: Epicritic and protective threshold grossly intact bilaterally.   Musculoskeletal Exam: Range of motion within normal limits to all pedal and ankle joints bilateral. Muscle strength 5/5 in all groups bilateral.  With weightbearing and nonweightbearing there is medial deviation of the digits 2 and 3 of the right foot.  Right great toe is elevated and does not purchase the ground with weightbearing and allows for the second toe to drift underneath the great toe.  Limited range of motion also noted to the right great toe MPJ.  Radiographic Exam:  Normal osseous mineralization.  Orthopedic hardware and implant intact.  Medial deviation of digits 2-5 right foot.  Orthopedic hardware noted to the first met cuneiform joint from prior surgery.  Broken hardware but otherwise intact.  Assessment: 1.  Hallux limitus/elevatus right great toe 2. Medially deviated toes digits 2-5 right foot   Plan of Care:  1. Patient evaluated. X-Rays reviewed.  2.  With weightbearing it is evident that the underlying problem is that the great toe does not purchase the ground allowing for the second toe to medially deviate under the hallux. 3.  Manipulation of the MPJ  demonstrated limited range of motion.  Recommend that the patient manipulate the joint daily at home and specifically work on plantarflexion of the great toe to allow it to better purchase during weightbearing 4.  Darco toe splint dispensed to plantarflex the great toe during weightbearing 5.  Continue wearing good supportive shoe gear 6.  Return to clinic in 6 weeks      Edrick Kins, DPM [facility]  Dr. Edrick Kins, DPM    [address], [place] [address]                Office [phone]  Fax [phone]

## 2019-01-27 DIAGNOSIS — R351 Nocturia: Secondary | ICD-10-CM | POA: Diagnosis not present

## 2019-01-27 DIAGNOSIS — N3941 Urge incontinence: Secondary | ICD-10-CM | POA: Diagnosis not present

## 2019-03-01 ENCOUNTER — Ambulatory Visit (INDEPENDENT_AMBULATORY_CARE_PROVIDER_SITE_OTHER): Payer: Medicare Other | Admitting: Podiatry

## 2019-03-01 ENCOUNTER — Other Ambulatory Visit: Payer: Self-pay

## 2019-03-01 ENCOUNTER — Encounter: Payer: Self-pay | Admitting: Podiatry

## 2019-03-01 VITALS — Temp 97.5°F

## 2019-03-01 DIAGNOSIS — M21271 Flexion deformity, right ankle and toes: Secondary | ICD-10-CM

## 2019-03-01 DIAGNOSIS — M2041 Other hammer toe(s) (acquired), right foot: Secondary | ICD-10-CM

## 2019-03-06 NOTE — Progress Notes (Signed)
   HPI: 73 year old female presenting today for follow up evaluation of hallux limitus of the right great toe. She states the pain has improved. She states the Darco splint has not been helping. She does not note any modifying factors. Patient is here for further evaluation and treatment.   History reviewed. No pertinent past medical history.   Physical Exam: General: The patient is alert and oriented x3 in no acute distress.  Dermatology: Skin is warm, dry and supple bilateral lower extremities. Negative for open lesions or macerations.  Vascular: Palpable pedal pulses bilaterally. No edema or erythema noted. Capillary refill within normal limits.  Neurological: Epicritic and protective threshold grossly intact bilaterally.   Musculoskeletal Exam: Range of motion within normal limits to all pedal and ankle joints bilateral. Muscle strength 5/5 in all groups bilateral.  With weightbearing and nonweightbearing there is medial deviation of the digits 2 and 3 of the right foot.  Right great toe is elevated and does not purchase the ground with weightbearing and allows for the second toe to drift underneath the great toe.  Limited range of motion also noted to the right great toe MPJ.   Assessment: 1. Hallux limitus/elevatus right great toe 2. Medially deviated toes digits 2-5 right foot   Plan of Care:  1. Patient evaluated.  2. Continue plantar flexion of the great toe daily.  3. Recommended good shoe gear.  4. Continue using Darco toe splint as needed.  5. Return to clinic as needed.     Edrick Kins, DPM [facility]  Dr. Edrick Kins, DPM    [address], [place] [address]                Office [phone]  Fax [phone]

## 2019-04-28 DIAGNOSIS — Z1389 Encounter for screening for other disorder: Secondary | ICD-10-CM | POA: Diagnosis not present

## 2019-04-28 DIAGNOSIS — Z6828 Body mass index (BMI) 28.0-28.9, adult: Secondary | ICD-10-CM | POA: Diagnosis not present

## 2019-04-28 DIAGNOSIS — E663 Overweight: Secondary | ICD-10-CM | POA: Diagnosis not present

## 2019-04-28 DIAGNOSIS — Z0001 Encounter for general adult medical examination with abnormal findings: Secondary | ICD-10-CM | POA: Diagnosis not present

## 2019-04-28 DIAGNOSIS — E1165 Type 2 diabetes mellitus with hyperglycemia: Secondary | ICD-10-CM | POA: Diagnosis not present

## 2019-05-01 DIAGNOSIS — E1165 Type 2 diabetes mellitus with hyperglycemia: Secondary | ICD-10-CM | POA: Diagnosis not present

## 2019-05-15 DIAGNOSIS — M8589 Other specified disorders of bone density and structure, multiple sites: Secondary | ICD-10-CM | POA: Diagnosis not present

## 2019-05-15 DIAGNOSIS — Z780 Asymptomatic menopausal state: Secondary | ICD-10-CM | POA: Diagnosis not present

## 2019-05-15 DIAGNOSIS — Z853 Personal history of malignant neoplasm of breast: Secondary | ICD-10-CM | POA: Diagnosis not present

## 2019-05-15 DIAGNOSIS — R29890 Loss of height: Secondary | ICD-10-CM | POA: Diagnosis not present

## 2019-05-15 DIAGNOSIS — Z90710 Acquired absence of both cervix and uterus: Secondary | ICD-10-CM | POA: Diagnosis not present

## 2019-05-24 ENCOUNTER — Other Ambulatory Visit: Payer: Self-pay | Admitting: Podiatry

## 2019-08-04 DIAGNOSIS — N3941 Urge incontinence: Secondary | ICD-10-CM | POA: Diagnosis not present

## 2019-08-04 DIAGNOSIS — R351 Nocturia: Secondary | ICD-10-CM | POA: Diagnosis not present

## 2019-12-06 ENCOUNTER — Emergency Department (HOSPITAL_COMMUNITY)
Admission: EM | Admit: 2019-12-06 | Discharge: 2019-12-06 | Disposition: A | Discharge status: Home / Self Care | Payer: Medicare Other | Attending: Emergency Medicine | Admitting: Emergency Medicine

## 2019-12-06 ENCOUNTER — Encounter (HOSPITAL_COMMUNITY): Payer: Self-pay | Admitting: Emergency Medicine

## 2019-12-06 ENCOUNTER — Other Ambulatory Visit: Payer: Self-pay

## 2019-12-06 DIAGNOSIS — Y939 Activity, unspecified: Secondary | ICD-10-CM | POA: Insufficient documentation

## 2019-12-06 DIAGNOSIS — X58XXXA Exposure to other specified factors, initial encounter: Secondary | ICD-10-CM | POA: Diagnosis not present

## 2019-12-06 DIAGNOSIS — Y929 Unspecified place or not applicable: Secondary | ICD-10-CM | POA: Insufficient documentation

## 2019-12-06 DIAGNOSIS — Z7982 Long term (current) use of aspirin: Secondary | ICD-10-CM | POA: Insufficient documentation

## 2019-12-06 DIAGNOSIS — Y999 Unspecified external cause status: Secondary | ICD-10-CM | POA: Insufficient documentation

## 2019-12-06 DIAGNOSIS — S0181XA Laceration without foreign body of other part of head, initial encounter: Secondary | ICD-10-CM | POA: Insufficient documentation

## 2019-12-06 DIAGNOSIS — Z79899 Other long term (current) drug therapy: Secondary | ICD-10-CM | POA: Diagnosis not present

## 2019-12-06 DIAGNOSIS — Z7984 Long term (current) use of oral hypoglycemic drugs: Secondary | ICD-10-CM | POA: Diagnosis not present

## 2019-12-06 DIAGNOSIS — E039 Hypothyroidism, unspecified: Secondary | ICD-10-CM | POA: Diagnosis not present

## 2019-12-06 DIAGNOSIS — E119 Type 2 diabetes mellitus without complications: Secondary | ICD-10-CM | POA: Insufficient documentation

## 2019-12-06 NOTE — ED Provider Notes (Signed)
[facility] EMERGENCY DEPARTMENT Provider Note   CSN: [id number] Arrival date & time: 12/06/19  1548     History Chief Complaint  Patient presents with  . Laceration    Emma MUHLESTEIN is a 74 y.o. female.  The history is provided by the patient. No language interpreter was used.  Laceration Location:  Face Facial laceration location:  Face Length:  1 Depth:  Through dermis Pain details:    Quality:  Aching   Severity:  No pain   Progression:  Worsening Foreign body present:  No foreign bodies Relieved by:  Nothing Worsened by:  Nothing Tetanus status:  Up to date      History reviewed. No pertinent past medical history.  Patient Active Problem List   Diagnosis Date Noted  . Gastroesophageal reflux disease with esophagitis 06/01/2018  . Diverticulosis 04/28/2018  . Internal hemorrhoids 04/28/2018  . Diabetes (Circ[place]) 04/04/2018  . History of adenomatous polyp of colon 04/04/2018  . Hyperlipidemia 04/04/2018  . Hypothyroid 04/04/2018    History reviewed. No pertinent surgical history.   OB History    Gravida      Para      Term      Preterm      AB      Living  2     SAB      TAB      Ectopic      Multiple      Live Births              History reviewed. No pertinent family history.  Social History   Tobacco Use  . Smoking status: Never Smoker  . Smokeless tobacco: Never Used  Substance Use Topics  . Alcohol use: Never  . Drug use: Never    Home Medications Prior to Admission medications   Medication Sig Start Date End Date Taking? Authorizing Provider  ACCU-CHEK AVIVA PLUS test strip TEST QID 01/25/18   [provider]  ACCU-CHEK FASTCLIX LANCETS MISC U TO TEST QID 01/21/18   [provider]  aspirin 325 MG tablet Take 325 mg by mouth daily.    [provider]  Calcium Citrate (CITRACAL PO) Take by mouth.    [provider]  celecoxib (CELEBREX) 200 MG capsule TAKE 1 CAPSULE BY MOUTH ONCE DAILY  IF NEEDED 12/23/17   [provider]  Cholecalciferol (VITAMIN D PO) Take by mouth.    [provider]  Diclofenac Sodium 1.5 % SOLN APP EXT AA QID 01/21/18   [provider]  escitalopram (LEXAPRO) 10 MG tablet TK 1 T PO D 01/20/18   [provider]  glimepiride (AMARYL) 2 MG tablet TK 2 TS PO D 01/21/18   [provider]  hydrochlorothiazide (HYDRODIURIL) 25 MG tablet TK 1 T PO D 01/21/18   [provider]  levothyroxine (SYNTHROID, LEVOTHROID) 100 MCG tablet TK 1 T PO D 01/21/18   [provider]  losartan (COZAAR) 100 MG tablet  11/17/17   [provider]  meloxicam (MOBIC) 15 MG tablet TAKE 1 TABLET EVERY DAY 05/24/19   Edrick Kins, DPM  metFORMIN (GLUCOPHAGE) 500 MG tablet TK 1 T PO BID 12/31/17   [provider]  omeprazole (PRILOSEC) 20 MG capsule  01/21/18   [provider]  oxybutynin (DITROPAN XL) 15 MG 24 hr tablet TK 1 T PO D 01/21/18   [provider]  oxyCODONE-acetaminophen (PERCOCET/ROXICET) 5-325 MG tablet TAKE 1 TABLET EVERY 4 HOURS AS NEEDED  FOR OAIN 03/03/18   [provider]  simvastatin (ZOCOR) 40 MG tablet TK 1 T PO D 01/21/18   [provider]    Allergies    Patient has no known allergies.  Review of Systems   Review of Systems  All other systems reviewed and are negative.   Physical Exam Updated Vital Signs BP (!) 145/62   Pulse 66   Temp 98 F (36.7 C) (Oral)   Resp 17   Ht 5' (1.524 m)   Wt 67.6 kg   SpO2 99%   BMI 29.10 kg/m   Physical Exam Vitals and nursing note reviewed.  Constitutional:      Appearance: She is well-developed.  HENT:     Head: Normocephalic.  Cardiovascular:     Rate and Rhythm: Normal rate.  Pulmonary:     Effort: Pulmonary effort is normal.  Abdominal:     General: There is no distension.  Musculoskeletal:        General: Normal range of motion.     Cervical back: Normal range of motion.  Skin:    Comments:  1cm laceration   Neurological:     Mental Status: She is alert and oriented to person, place, and time.  Psychiatric:        Mood and Affect: Mood normal.     ED Results / Procedures / Treatments   Labs (all labs ordered are listed, but only abnormal results are displayed) Labs Reviewed - No data to display  EKG None  Radiology No results found.  Procedures .Marland KitchenLaceration Repair  Date/Time: 12/06/2019 6:53 PM Performed by: Fransico Meadow, PA-C Authorized by: Fransico Meadow, PA-C   Consent:    Consent obtained:  Verbal   Consent given by:  Patient   Risks discussed:  Infection, need for additional repair, pain, poor cosmetic result and poor wound healing   Alternatives discussed:  No treatment and delayed treatment Universal protocol:    Procedure explained and questions answered to patient or proxy's satisfaction: yes     Relevant documents present and verified: yes     Test results available and properly labeled: yes     Imaging studies available: yes     Required blood products, implants, devices, and special equipment available: yes     Site/side marked: yes     Immediately prior to procedure, a time out was called: yes     Patient identity confirmed:  Verbally with patient Exploration:    Wound extent: no foreign bodies/material noted   Treatment:    Area cleansed with:  Betadine   Irrigation solution:  Sterile saline Skin repair:    Repair method:  Tissue adhesive Post-procedure details:    Patient tolerance of procedure:  Tolerated well, no immediate complications   (including critical care time)  Medications Ordered in ED Medications - No data to display  ED Course  I have reviewed the triage vital signs and the nursing notes.  Pertinent labs & imaging results that were available during my care of the patient were reviewed by me and considered in my medical decision making (see chart for details).    MDM Rules/Calculators/A&P                        Final Clinical Impression(s) / ED Diagnoses Final diagnoses:  Laceration of forehead, initial encounter    Rx / DC Orders ED Discharge Orders    None    An After Visit  Summary was printed and given to the patient.    Fransico Meadow, Hershal Coria 12/06/19 Randol Kern    Fredia Sorrow, MD 12/08/19 [phone]
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
## 2019-12-06 NOTE — Discharge Instructions (Signed)
Return if any problems.

## 2020-01-22 DIAGNOSIS — E7849 Other hyperlipidemia: Secondary | ICD-10-CM | POA: Diagnosis not present

## 2020-01-22 DIAGNOSIS — Z1389 Encounter for screening for other disorder: Secondary | ICD-10-CM | POA: Diagnosis not present

## 2020-01-22 DIAGNOSIS — E663 Overweight: Secondary | ICD-10-CM | POA: Diagnosis not present

## 2020-01-22 DIAGNOSIS — Z6826 Body mass index (BMI) 26.0-26.9, adult: Secondary | ICD-10-CM | POA: Diagnosis not present

## 2020-01-22 DIAGNOSIS — I10 Essential (primary) hypertension: Secondary | ICD-10-CM | POA: Diagnosis not present

## 2020-01-22 DIAGNOSIS — E1165 Type 2 diabetes mellitus with hyperglycemia: Secondary | ICD-10-CM | POA: Diagnosis not present

## 2020-01-22 DIAGNOSIS — M1991 Primary osteoarthritis, unspecified site: Secondary | ICD-10-CM | POA: Diagnosis not present

## 2020-01-22 DIAGNOSIS — M1711 Unilateral primary osteoarthritis, right knee: Secondary | ICD-10-CM | POA: Diagnosis not present

## 2020-01-22 DIAGNOSIS — E039 Hypothyroidism, unspecified: Secondary | ICD-10-CM | POA: Diagnosis not present

## 2020-02-12 DIAGNOSIS — M25531 Pain in right wrist: Secondary | ICD-10-CM | POA: Diagnosis not present

## 2020-02-12 DIAGNOSIS — M25561 Pain in right knee: Secondary | ICD-10-CM | POA: Diagnosis not present

## 2020-03-13 DIAGNOSIS — Z1231 Encounter for screening mammogram for malignant neoplasm of breast: Secondary | ICD-10-CM | POA: Diagnosis not present

## 2020-04-01 ENCOUNTER — Ambulatory Visit (INDEPENDENT_AMBULATORY_CARE_PROVIDER_SITE_OTHER): Payer: Medicare Other

## 2020-04-01 ENCOUNTER — Other Ambulatory Visit: Payer: Self-pay

## 2020-04-01 ENCOUNTER — Ambulatory Visit (INDEPENDENT_AMBULATORY_CARE_PROVIDER_SITE_OTHER): Payer: Medicare Other | Admitting: Podiatry

## 2020-04-01 DIAGNOSIS — M2041 Other hammer toe(s) (acquired), right foot: Secondary | ICD-10-CM | POA: Diagnosis not present

## 2020-04-01 NOTE — Progress Notes (Signed)
   HPI: 74 y.o. female presenting today as an established patient for evaluation of deviation of the toes to the second and third toe of the right foot.  Patient has a history of multiple surgeries to the right foot.  Last surgery was the first MTPJ implant with hammertoe repair 2, 3 right foot on 03/03/2018.  Patient states that over the past year she has noticed that the second toe has deviated and is now sitting underneath her great toe.  This is causing pain with ambulation and shoe gear.  She presents for further treatment evaluation  No past medical history on file.   Physical Exam: General: The patient is alert and oriented x3 in no acute distress.  Dermatology: Skin is warm, dry and supple bilateral lower extremities. Negative for open lesions or macerations.  Vascular: Palpable pedal pulses bilaterally. No edema or erythema noted. Capillary refill within normal limits.  Neurological: Epicritic and protective threshold grossly intact bilaterally.   Musculoskeletal Exam: Range of motion within normal limits to all pedal and ankle joints bilateral. Muscle strength 5/5 in all groups bilateral.  There is some pain on palpation to the second and third toe as well as pain with range of motion noted.  Radiographic Exam:  Normal osseous mineralization.  First MTPJ implant appears stable and intact right foot.  There is also an implanted screw to the head of the second metatarsal which is intact.  There is disarticulation/deviation at the PIPJ of the second and third toe.  There is also some deviation at the MTPJ of the respective digits as well contributing to the malalignment of the toe.  Assessment: 1.  H/o multiple foot surgeries.  Last surgery consisted of first MTPJ implant with hammertoe repair 2, 3 right foot.  DOS: 03/03/2018. 2.  Recurrent hammertoe contracture digits 2, 3 right foot   Plan of Care:  1. Patient evaluated. X-Rays reviewed.  2. Today we discussed the conservative  versus surgical management of the presenting pathology. The patient opts for surgical management. All possible complications and details of the procedure were explained. All patient questions were answered. No guarantees were expressed or implied. 3. Authorization for surgery was initiated today. Surgery will consist of PIPJ arthrodesis with MTPJ capsulotomy second and third digit right foot 4.  Return to clinic 1 week postop        Edrick Kins, DPM [facility]  Dr. Edrick Kins, DPM    [address], [place] [address]                Office [phone]  Fax [phone]

## 2020-04-10 DIAGNOSIS — I129 Hypertensive chronic kidney disease with stage 1 through stage 4 chronic kidney disease, or unspecified chronic kidney disease: Secondary | ICD-10-CM | POA: Diagnosis not present

## 2020-04-10 DIAGNOSIS — E039 Hypothyroidism, unspecified: Secondary | ICD-10-CM | POA: Diagnosis not present

## 2020-04-10 DIAGNOSIS — N181 Chronic kidney disease, stage 1: Secondary | ICD-10-CM | POA: Diagnosis not present

## 2020-04-10 DIAGNOSIS — E1165 Type 2 diabetes mellitus with hyperglycemia: Secondary | ICD-10-CM | POA: Diagnosis not present

## 2020-04-30 DIAGNOSIS — Z1389 Encounter for screening for other disorder: Secondary | ICD-10-CM | POA: Diagnosis not present

## 2020-04-30 DIAGNOSIS — E039 Hypothyroidism, unspecified: Secondary | ICD-10-CM | POA: Diagnosis not present

## 2020-04-30 DIAGNOSIS — Z6828 Body mass index (BMI) 28.0-28.9, adult: Secondary | ICD-10-CM | POA: Diagnosis not present

## 2020-04-30 DIAGNOSIS — J302 Other seasonal allergic rhinitis: Secondary | ICD-10-CM | POA: Diagnosis not present

## 2020-04-30 DIAGNOSIS — E119 Type 2 diabetes mellitus without complications: Secondary | ICD-10-CM | POA: Diagnosis not present

## 2020-04-30 DIAGNOSIS — E663 Overweight: Secondary | ICD-10-CM | POA: Diagnosis not present

## 2020-04-30 DIAGNOSIS — M1991 Primary osteoarthritis, unspecified site: Secondary | ICD-10-CM | POA: Diagnosis not present

## 2020-04-30 DIAGNOSIS — Z0000 Encounter for general adult medical examination without abnormal findings: Secondary | ICD-10-CM | POA: Diagnosis not present

## 2020-04-30 DIAGNOSIS — M1711 Unilateral primary osteoarthritis, right knee: Secondary | ICD-10-CM | POA: Diagnosis not present

## 2020-04-30 DIAGNOSIS — Z23 Encounter for immunization: Secondary | ICD-10-CM | POA: Diagnosis not present

## 2020-05-10 DIAGNOSIS — E039 Hypothyroidism, unspecified: Secondary | ICD-10-CM | POA: Diagnosis not present

## 2020-05-10 DIAGNOSIS — E1122 Type 2 diabetes mellitus with diabetic chronic kidney disease: Secondary | ICD-10-CM | POA: Diagnosis not present

## 2020-05-10 DIAGNOSIS — I129 Hypertensive chronic kidney disease with stage 1 through stage 4 chronic kidney disease, or unspecified chronic kidney disease: Secondary | ICD-10-CM | POA: Diagnosis not present

## 2020-05-10 DIAGNOSIS — N181 Chronic kidney disease, stage 1: Secondary | ICD-10-CM | POA: Diagnosis not present

## 2020-05-16 ENCOUNTER — Other Ambulatory Visit: Payer: Self-pay | Admitting: Podiatry

## 2020-05-16 DIAGNOSIS — M2041 Other hammer toe(s) (acquired), right foot: Secondary | ICD-10-CM

## 2020-05-16 DIAGNOSIS — I10 Essential (primary) hypertension: Secondary | ICD-10-CM | POA: Diagnosis not present

## 2020-05-16 DIAGNOSIS — M7751 Other enthesopathy of right foot: Secondary | ICD-10-CM

## 2020-05-16 MED ORDER — MELOXICAM 15 MG PO TABS: 15.0000 mg | ORAL_TABLET | Freq: Every day | ORAL | 1 refills | Status: DC

## 2020-05-16 MED ORDER — OXYCODONE-ACETAMINOPHEN 5-325 MG PO TABS: 1.0000 | ORAL_TABLET | ORAL | 0 refills | Status: DC | PRN

## 2020-05-16 NOTE — Progress Notes (Signed)
PRN postop 

## 2020-05-22 ENCOUNTER — Encounter: Payer: Medicare Other | Admitting: Podiatry

## 2020-05-24 ENCOUNTER — Ambulatory Visit (INDEPENDENT_AMBULATORY_CARE_PROVIDER_SITE_OTHER): Payer: Medicare Other | Admitting: Podiatry

## 2020-05-24 ENCOUNTER — Ambulatory Visit (INDEPENDENT_AMBULATORY_CARE_PROVIDER_SITE_OTHER): Payer: Medicare Other

## 2020-05-24 ENCOUNTER — Other Ambulatory Visit: Payer: Self-pay

## 2020-05-24 DIAGNOSIS — M2041 Other hammer toe(s) (acquired), right foot: Secondary | ICD-10-CM

## 2020-05-24 DIAGNOSIS — Z9889 Other specified postprocedural states: Secondary | ICD-10-CM

## 2020-05-24 DIAGNOSIS — M21271 Flexion deformity, right ankle and toes: Secondary | ICD-10-CM

## 2020-05-24 MED ORDER — DOXYCYCLINE HYCLATE 100 MG PO TABS: 100.0000 mg | ORAL_TABLET | Freq: Two times a day (BID) | ORAL | 0 refills | Status: DC

## 2020-05-24 NOTE — Progress Notes (Signed)
   Subjective:  Patient presents today status post hammertoe repair of the second and third digit of the right foot.. DOS: 05/16/2020.  Patient states she does not have any pain she is not taking any pain medication or the meloxicam.  She has been weightbearing in the cam boot as directed.  No new complaints at this time.  No past medical history on file.    Objective/Physical Exam Neurovascular status intact.  Skin incisions appear to be well coapted with sutures intact.  There does appear to be some increased erythema around the surgical site concerning for some low-grade cellulitis.. No dehiscence. No active bleeding noted. Moderate edema noted to the surgical extremity.  Radiographic Exam:  Orthopedic hardware and osteotomies sites appear to be stable with routine healing.  The second and third toes are in good alignment with the percutaneous fixation pins stable and intact.  Assessment: 1. s/p hammertoe repair second and third digit right foot. DOS: 05/16/2020   Plan of Care:  1. Patient was evaluated. X-rays reviewed 2.  Prescription for doxycycline 100 mg #20 two times daily x10 days 3.  Dressings changed today.  Keep clean dry and intact x1 week 4.  Continue minimal weightbearing in the cam boot as directed 5.  Return to clinic in 1 week   Edrick Kins, DPM [facility]  Dr. Edrick Kins, [address]                                        [place], [place] [address]                Office [phone]  Fax [phone]

## 2020-05-29 ENCOUNTER — Encounter: Payer: Medicare Other | Admitting: Podiatry

## 2020-05-31 ENCOUNTER — Ambulatory Visit (INDEPENDENT_AMBULATORY_CARE_PROVIDER_SITE_OTHER): Payer: Medicare Other | Admitting: Podiatry

## 2020-05-31 ENCOUNTER — Other Ambulatory Visit: Payer: Self-pay

## 2020-05-31 DIAGNOSIS — M2041 Other hammer toe(s) (acquired), right foot: Secondary | ICD-10-CM

## 2020-05-31 DIAGNOSIS — Z9889 Other specified postprocedural states: Secondary | ICD-10-CM

## 2020-05-31 DIAGNOSIS — M21271 Flexion deformity, right ankle and toes: Secondary | ICD-10-CM | POA: Diagnosis not present

## 2020-06-03 NOTE — Progress Notes (Signed)
   Subjective:  Patient presents today status post hammertoe repair of the second and third digit of the right foot.. DOS: 05/16/2020.  Patient is doing very well.  She denies any fever chills nausea vomiting shortness of breath or chest pain.  Overall she is doing well with minimal pain.  She is still taking the oral antibiotic doxycycline that was prescribed last visit.  She has approximately 3 days left.  She has also been minimally weightbearing in the cam boot as directed.  No new complaints at this time  No past medical history on file.    Objective/Physical Exam Neurovascular status intact.  Skin incisions appear to be well coapted with sutures intact.  Improved erythema around the surgical incision site.  The erythema appears to be completely resolved. No dehiscence. No active bleeding noted. Moderate edema noted to the surgical extremity.   Assessment: 1. s/p hammertoe repair second and third digit right foot. DOS: 05/16/2020   Plan of Care:  1. Patient was evaluated.  2.  Continue doxycycline 100 mg until completed 3.  Today sutures were removed.  Light dressing applied. 4.  Discontinue cam boot.  Postoperative shoe dispensed today.  Weightbearing as tolerated. 5.  Return to clinic in 2 weeks for percutaneous fixation pin removal  Edrick Kins, DPM [facility]  Dr. Edrick Kins, [address]                                        [place], [place] [address]                Office [phone]  Fax [phone]

## 2020-06-10 ENCOUNTER — Telehealth: Payer: Self-pay | Admitting: Podiatry

## 2020-06-10 NOTE — Telephone Encounter (Signed)
I spoke with her and she will go back to the CAM boot.   Crystal, could you give her a call and schedule her for an appt with me in [place] tomorrow and we'll check a new X-ray? Pin may need adjustment or removal.   Thanks! Dr Sherryle Lis

## 2020-06-10 NOTE — Telephone Encounter (Signed)
Patient called and stated pin in right 2nd toe is coming out. Daughter pushed it back in one time. It is trying to come out again. Please give patient a call back at [phone].

## 2020-06-11 ENCOUNTER — Ambulatory Visit (INDEPENDENT_AMBULATORY_CARE_PROVIDER_SITE_OTHER): Payer: Medicare Other | Admitting: Podiatry

## 2020-06-11 ENCOUNTER — Ambulatory Visit (INDEPENDENT_AMBULATORY_CARE_PROVIDER_SITE_OTHER): Payer: Medicare Other

## 2020-06-11 ENCOUNTER — Other Ambulatory Visit: Payer: Self-pay

## 2020-06-11 DIAGNOSIS — M2041 Other hammer toe(s) (acquired), right foot: Secondary | ICD-10-CM

## 2020-06-11 DIAGNOSIS — Z9889 Other specified postprocedural states: Secondary | ICD-10-CM | POA: Diagnosis not present

## 2020-06-12 ENCOUNTER — Encounter: Payer: Self-pay | Admitting: Podiatry

## 2020-06-12 ENCOUNTER — Encounter: Payer: Medicare Other | Admitting: Podiatry

## 2020-06-12 NOTE — Progress Notes (Signed)
Subjective:  Patient ID: [name], female    DOB: 1946/06/04,  MRN: [id number]  Chief Complaint  Patient presents with  . Routine Post Op    DOS 05/16/20 HAMMERTOE REPAIR RT 2ND & 3RD TOE W/CAPSULOTOMY     74 y.o. female returns for post-op check.  Patient presents with complaint of loosening of the pain on the second digit of the right side.  Patient states the foot pain has been going in and out and she is afraid to put the pin back in.  She denies any other acute complaint the pain of the third toe has been doing fine.  She is has been minimally weightbearing with a cam boot.  She denies any other acute complaints.  She finished her antibiotic course.  Review of Systems: Negative except as noted in the HPI. Denies N/V/F/Ch.  No past medical history on file.  Current Outpatient Medications:  .  ACCU-CHEK AVIVA PLUS test strip, TEST QID, Disp: , Rfl: 5 .  ACCU-CHEK FASTCLIX LANCETS MISC, U TO TEST QID, Disp: , Rfl: 10 .  aspirin 325 MG tablet, Take 325 mg by mouth daily., Disp: , Rfl:  .  Calcium Citrate (CITRACAL PO), Take by mouth., Disp: , Rfl:  .  celecoxib (CELEBREX) 200 MG capsule, TAKE 1 CAPSULE BY MOUTH ONCE DAILY IF NEEDED, Disp: , Rfl: 1 .  Cholecalciferol (VITAMIN D PO), Take by mouth., Disp: , Rfl:  .  Diclofenac Sodium 1.5 % SOLN, APP EXT AA QID, Disp: , Rfl: 2 .  doxycycline (VIBRA-TABS) 100 MG tablet, Take 1 tablet (100 mg total) by mouth 2 (two) times daily., Disp: 20 tablet, Rfl: 0 .  escitalopram (LEXAPRO) 10 MG tablet, TK 1 T PO D, Disp: , Rfl: 1 .  glimepiride (AMARYL) 2 MG tablet, TK 2 TS PO D, Disp: , Rfl: 1 .  hydrochlorothiazide (HYDRODIURIL) 25 MG tablet, TK 1 T PO D, Disp: , Rfl: 3 .  levothyroxine (SYNTHROID, LEVOTHROID) 100 MCG tablet, TK 1 T PO D, Disp: , Rfl: 3 .  losartan (COZAAR) 100 MG tablet, , Disp: , Rfl: 0 .  meloxicam (MOBIC) 15 MG tablet, Take 1 tablet (15 mg total) by mouth daily., Disp: 30 tablet, Rfl: 1 .  metFORMIN (GLUCOPHAGE) 500 MG  tablet, TK 1 T PO BID, Disp: , Rfl: 1 .  omeprazole (PRILOSEC) 20 MG capsule, , Disp: , Rfl: 0 .  oxybutynin (DITROPAN XL) 15 MG 24 hr tablet, TK 1 T PO D, Disp: , Rfl: 3 .  oxyCODONE-acetaminophen (PERCOCET) 5-325 MG tablet, Take 1 tablet by mouth every 4 (four) hours as needed for severe pain., Disp: 30 tablet, Rfl: 0 .  simvastatin (ZOCOR) 40 MG tablet, TK 1 T PO D, Disp: , Rfl: 3  Social History   Tobacco Use  Smoking Status Never Smoker  Smokeless Tobacco Never Used    No Known Allergies Objective:  There were no vitals filed for this visit. There is no height or weight on file to calculate BMI. Constitutional Well developed. Well nourished.  Vascular Foot warm and well perfused. Capillary refill normal to all digits.   Neurologic Normal speech. Oriented to person, place, and time. Epicritic sensation to light touch grossly present bilaterally.  Dermatologic Skin healing well without signs of infection. Skin edges well coapted without signs of infection.  Orthopedic: Tenderness to palpation noted about the surgical site.   Radiographs: 3 views of skeletally mature adult right foot: Hardware is intact without any signs of  breaking noted.  Mild loosening of the right second digit pain noted.  The third digit appears to be within normal limits good correction alignment noted. Assessment:   1. Hammer toe of right foot   2. Status post right foot surgery    Plan:  Patient was evaluated and treated and all questions answered.  S/p foot surgery right -Progressing as expected post-operatively. -XR: See above -WB Status: Minimally weightbearing as tolerated in cam boot -Sutures: None.  The pin was removed from the right second digit as it was pistoning in and out and was not holding well.  The third digit K wire was left inserted as it appears to be holding well and has now been 4 weeks before removal.  I will let Dr. Amalia Hailey take the pin out whenever appropriate. -Medications:  None -Foot redressed.  No follow-ups on file.

## 2020-06-18 ENCOUNTER — Ambulatory Visit (INDEPENDENT_AMBULATORY_CARE_PROVIDER_SITE_OTHER): Payer: Medicare Other

## 2020-06-18 ENCOUNTER — Ambulatory Visit (INDEPENDENT_AMBULATORY_CARE_PROVIDER_SITE_OTHER): Payer: Medicare Other | Admitting: Podiatry

## 2020-06-18 ENCOUNTER — Other Ambulatory Visit: Payer: Self-pay

## 2020-06-18 DIAGNOSIS — M2041 Other hammer toe(s) (acquired), right foot: Secondary | ICD-10-CM

## 2020-06-18 DIAGNOSIS — Z9889 Other specified postprocedural states: Secondary | ICD-10-CM

## 2020-06-18 NOTE — Progress Notes (Signed)
   Subjective:  Patient presents today status post hammertoe repair of the second and third digit of the right foot.. DOS: 05/16/2020.  Patient was last seen in the office on 06/11/2020 under the care of Dr. Posey Pronto the second percutaneous fixation pin was removed to the second toe.  Patient is doing well today and she is weightbearing as tolerated in a cam boot.  No new complaints at this time.  No past medical history on file.    Objective/Physical Exam Neurovascular status intact.  Skin incisions appear to be well coapted and healed.  Negative for any erythema.  There is some moderate edema noted to the surgical forefoot.  The percutaneous fixation pin to the third ray is intact.  Toes are in good rectus alignment.   Assessment: 1. s/p hammertoe repair second and third digit right foot. DOS: 05/16/2020   Plan of Care:  1. Patient was evaluated.  2.  Today the percutaneous fixation pin to the third ray was removed. 3.  Light dressing applied.  4.  Discontinue cam boot.  Patient may resume postsurgical shoe 5.  Return to clinic in 4 weeks to transition back into good supportive sneakers  Edrick Kins, DPM [facility]  Dr. Edrick Kins, [address]                                        [place], [place] [address]                Office [phone]  Fax [phone]

## 2020-07-08 DIAGNOSIS — E119 Type 2 diabetes mellitus without complications: Secondary | ICD-10-CM | POA: Diagnosis not present

## 2020-07-08 DIAGNOSIS — Z961 Presence of intraocular lens: Secondary | ICD-10-CM | POA: Diagnosis not present

## 2020-07-08 DIAGNOSIS — H35033 Hypertensive retinopathy, bilateral: Secondary | ICD-10-CM | POA: Diagnosis not present

## 2020-07-08 DIAGNOSIS — H35371 Puckering of macula, right eye: Secondary | ICD-10-CM | POA: Diagnosis not present

## 2020-07-11 DIAGNOSIS — E7849 Other hyperlipidemia: Secondary | ICD-10-CM | POA: Diagnosis not present

## 2020-07-11 DIAGNOSIS — N181 Chronic kidney disease, stage 1: Secondary | ICD-10-CM | POA: Diagnosis not present

## 2020-07-11 DIAGNOSIS — I10 Essential (primary) hypertension: Secondary | ICD-10-CM | POA: Diagnosis not present

## 2020-07-11 DIAGNOSIS — E119 Type 2 diabetes mellitus without complications: Secondary | ICD-10-CM | POA: Diagnosis not present

## 2020-07-15 ENCOUNTER — Other Ambulatory Visit: Payer: Self-pay | Admitting: Podiatry

## 2020-07-16 ENCOUNTER — Encounter: Payer: Self-pay | Admitting: Podiatry

## 2020-07-16 ENCOUNTER — Ambulatory Visit (INDEPENDENT_AMBULATORY_CARE_PROVIDER_SITE_OTHER): Payer: Medicare Other | Admitting: Podiatry

## 2020-07-16 ENCOUNTER — Other Ambulatory Visit: Payer: Self-pay

## 2020-07-16 ENCOUNTER — Ambulatory Visit (INDEPENDENT_AMBULATORY_CARE_PROVIDER_SITE_OTHER): Payer: Medicare Other

## 2020-07-16 DIAGNOSIS — M2041 Other hammer toe(s) (acquired), right foot: Secondary | ICD-10-CM | POA: Diagnosis not present

## 2020-07-16 DIAGNOSIS — Z9889 Other specified postprocedural states: Secondary | ICD-10-CM

## 2020-07-16 NOTE — Progress Notes (Signed)
   Subjective:  Patient presents today status post hammertoe repair of the second and third digit of the right foot.. DOS: 05/16/2020.  Patient is doing very well today.  She has been weightbearing in the postsurgical shoe as directed.  No pain.  No new complaints at this time.  No past medical history on file.    Objective/Physical Exam Neurovascular status intact.  Skin incisions healed.  Negative for any erythema.  There is some minimal edema noted to the first intermetatarsal space of the surgical forefoot.  Toes are in good rectus alignment.   Assessment: 1. s/p hammertoe repair second and third digit right foot. DOS: 05/16/2020   Plan of Care:  1. Patient was evaluated.  2.  Patient may discontinue postsurgical shoe 3.  Resume good supportive sneakers 4.  Patient may now resume full activity no restrictions 5.  Return to clinic as needed   Edrick Kins, DPM [facility]  Dr. Edrick Kins, DPM    [address], [place] [address]                Office [phone]  Fax [phone]

## 2020-07-30 ENCOUNTER — Other Ambulatory Visit: Payer: Self-pay

## 2020-07-30 ENCOUNTER — Emergency Department (HOSPITAL_COMMUNITY): Payer: Medicare Other

## 2020-07-30 ENCOUNTER — Inpatient Hospital Stay (HOSPITAL_COMMUNITY)
Admission: EM | Admit: 2020-07-30 | Discharge: 2020-08-05 | DRG: 177 | Disposition: A | Discharge status: Skilled Nursing Facility | Payer: Medicare Other | Attending: Internal Medicine | Admitting: Internal Medicine

## 2020-07-30 ENCOUNTER — Encounter (HOSPITAL_COMMUNITY): Payer: Self-pay | Admitting: *Deleted

## 2020-07-30 DIAGNOSIS — D446 Neoplasm of uncertain behavior of carotid body: Secondary | ICD-10-CM | POA: Diagnosis not present

## 2020-07-30 DIAGNOSIS — R0689 Other abnormalities of breathing: Secondary | ICD-10-CM | POA: Diagnosis not present

## 2020-07-30 DIAGNOSIS — I10 Essential (primary) hypertension: Secondary | ICD-10-CM | POA: Diagnosis present

## 2020-07-30 DIAGNOSIS — E871 Hypo-osmolality and hyponatremia: Secondary | ICD-10-CM | POA: Diagnosis present

## 2020-07-30 DIAGNOSIS — G9340 Encephalopathy, unspecified: Secondary | ICD-10-CM

## 2020-07-30 DIAGNOSIS — F419 Anxiety disorder, unspecified: Secondary | ICD-10-CM | POA: Diagnosis present

## 2020-07-30 DIAGNOSIS — E7800 Pure hypercholesterolemia, unspecified: Secondary | ICD-10-CM | POA: Diagnosis present

## 2020-07-30 DIAGNOSIS — E1165 Type 2 diabetes mellitus with hyperglycemia: Secondary | ICD-10-CM | POA: Diagnosis present

## 2020-07-30 DIAGNOSIS — I639 Cerebral infarction, unspecified: Secondary | ICD-10-CM

## 2020-07-30 DIAGNOSIS — F32A Depression, unspecified: Secondary | ICD-10-CM | POA: Diagnosis present

## 2020-07-30 DIAGNOSIS — I63233 Cerebral infarction due to unspecified occlusion or stenosis of bilateral carotid arteries: Secondary | ICD-10-CM | POA: Diagnosis not present

## 2020-07-30 DIAGNOSIS — J1282 Pneumonia due to coronavirus disease 2019: Secondary | ICD-10-CM | POA: Diagnosis present

## 2020-07-30 DIAGNOSIS — Z7982 Long term (current) use of aspirin: Secondary | ICD-10-CM | POA: Diagnosis not present

## 2020-07-30 DIAGNOSIS — Z7401 Bed confinement status: Secondary | ICD-10-CM | POA: Diagnosis not present

## 2020-07-30 DIAGNOSIS — E860 Dehydration: Secondary | ICD-10-CM | POA: Diagnosis present

## 2020-07-30 DIAGNOSIS — R1312 Dysphagia, oropharyngeal phase: Secondary | ICD-10-CM | POA: Diagnosis not present

## 2020-07-30 DIAGNOSIS — K5790 Diverticulosis of intestine, part unspecified, without perforation or abscess without bleeding: Secondary | ICD-10-CM | POA: Diagnosis present

## 2020-07-30 DIAGNOSIS — M6281 Muscle weakness (generalized): Secondary | ICD-10-CM | POA: Diagnosis not present

## 2020-07-30 DIAGNOSIS — J9601 Acute respiratory failure with hypoxia: Secondary | ICD-10-CM | POA: Diagnosis present

## 2020-07-30 DIAGNOSIS — Z791 Long term (current) use of non-steroidal anti-inflammatories (NSAID): Secondary | ICD-10-CM

## 2020-07-30 DIAGNOSIS — Z79899 Other long term (current) drug therapy: Secondary | ICD-10-CM

## 2020-07-30 DIAGNOSIS — E039 Hypothyroidism, unspecified: Secondary | ICD-10-CM | POA: Diagnosis present

## 2020-07-30 DIAGNOSIS — E876 Hypokalemia: Secondary | ICD-10-CM | POA: Diagnosis not present

## 2020-07-30 DIAGNOSIS — R4182 Altered mental status, unspecified: Secondary | ICD-10-CM | POA: Diagnosis not present

## 2020-07-30 DIAGNOSIS — R498 Other voice and resonance disorders: Secondary | ICD-10-CM | POA: Diagnosis not present

## 2020-07-30 DIAGNOSIS — I340 Nonrheumatic mitral (valve) insufficiency: Secondary | ICD-10-CM | POA: Diagnosis not present

## 2020-07-30 DIAGNOSIS — G9341 Metabolic encephalopathy: Secondary | ICD-10-CM | POA: Diagnosis present

## 2020-07-30 DIAGNOSIS — U071 COVID-19: Secondary | ICD-10-CM | POA: Diagnosis present

## 2020-07-30 DIAGNOSIS — Z79890 Hormone replacement therapy (postmenopausal): Secondary | ICD-10-CM | POA: Diagnosis not present

## 2020-07-30 DIAGNOSIS — E785 Hyperlipidemia, unspecified: Secondary | ICD-10-CM | POA: Diagnosis present

## 2020-07-30 DIAGNOSIS — E119 Type 2 diabetes mellitus without complications: Secondary | ICD-10-CM

## 2020-07-30 DIAGNOSIS — Z7984 Long term (current) use of oral hypoglycemic drugs: Secondary | ICD-10-CM | POA: Diagnosis not present

## 2020-07-30 DIAGNOSIS — R41841 Cognitive communication deficit: Secondary | ICD-10-CM | POA: Diagnosis not present

## 2020-07-30 DIAGNOSIS — J984 Other disorders of lung: Secondary | ICD-10-CM | POA: Diagnosis not present

## 2020-07-30 DIAGNOSIS — I69928 Other speech and language deficits following unspecified cerebrovascular disease: Secondary | ICD-10-CM | POA: Diagnosis not present

## 2020-07-30 DIAGNOSIS — I6603 Occlusion and stenosis of bilateral middle cerebral arteries: Secondary | ICD-10-CM | POA: Diagnosis not present

## 2020-07-30 DIAGNOSIS — M6259 Muscle wasting and atrophy, not elsewhere classified, multiple sites: Secondary | ICD-10-CM | POA: Diagnosis not present

## 2020-07-30 HISTORY — DX: Pure hypercholesterolemia, unspecified: E78.00

## 2020-07-30 HISTORY — DX: Type 2 diabetes mellitus without complications: E11.9

## 2020-07-30 HISTORY — DX: Essential (primary) hypertension: I10

## 2020-07-30 LAB — DIFFERENTIAL
Abs Immature Granulocytes: 0.03 10*3/uL (ref 0.00–0.07)
Basophils Absolute: 0 10*3/uL (ref 0.0–0.1)
Basophils Relative: 0 %
Eosinophils Absolute: 0 10*3/uL (ref 0.0–0.5)
Eosinophils Relative: 0 %
Immature Granulocytes: 0 %
Lymphocytes Relative: 8 %
Lymphs Abs: 0.5 10*3/uL — ABNORMAL LOW (ref 0.7–4.0)
Monocytes Absolute: 0.3 10*3/uL (ref 0.1–1.0)
Monocytes Relative: 4 %
Neutro Abs: 5.9 10*3/uL (ref 1.7–7.7)
Neutrophils Relative %: 88 %

## 2020-07-30 LAB — COMPREHENSIVE METABOLIC PANEL
ALT: 45 U/L — ABNORMAL HIGH (ref 0–44)
AST: 68 U/L — ABNORMAL HIGH (ref 15–41)
Albumin: 3.4 g/dL — ABNORMAL LOW (ref 3.5–5.0)
Alkaline Phosphatase: 40 U/L (ref 38–126)
Anion gap: 14 (ref 5–15)
BUN: 18 mg/dL (ref 8–23)
CO2: 28 mmol/L (ref 22–32)
Calcium: 8.4 mg/dL — ABNORMAL LOW (ref 8.9–10.3)
Chloride: 88 mmol/L — ABNORMAL LOW (ref 98–111)
Creatinine, Ser: 0.8 mg/dL (ref 0.44–1.00)
GFR, Estimated: >60 mL/min (ref >60)
Glucose, Bld: 307 mg/dL — ABNORMAL HIGH (ref 70–99)
Potassium: 3.3 mmol/L — ABNORMAL LOW (ref 3.5–5.1)
Sodium: 130 mmol/L — ABNORMAL LOW (ref 135–145)
Total Bilirubin: 0.8 mg/dL (ref 0.3–1.2)
Total Protein: 6.8 g/dL (ref 6.5–8.1)

## 2020-07-30 LAB — RESPIRATORY PANEL BY RT PCR (FLU A&B, COVID)
Influenza A by PCR: NEGATIVE
Influenza B by PCR: NEGATIVE
SARS Coronavirus 2 by RT PCR: POSITIVE — AB

## 2020-07-30 LAB — TRIGLYCERIDES: Triglycerides: 172 mg/dL — ABNORMAL HIGH (ref <150)

## 2020-07-30 LAB — CBC
HCT: 38.2 % (ref 36.0–46.0)
Hemoglobin: 12.9 g/dL (ref 12.0–15.0)
MCH: 30.1 pg (ref 26.0–34.0)
MCHC: 33.8 g/dL (ref 30.0–36.0)
MCV: 89 fL (ref 80.0–100.0)
Platelets: 277 10*3/uL (ref 150–400)
RBC: 4.29 MIL/uL (ref 3.87–5.11)
RDW: 13.1 % (ref 11.5–15.5)
WBC: 6.7 10*3/uL (ref 4.0–10.5)
nRBC: 0 % (ref 0.0–0.2)

## 2020-07-30 LAB — TSH: TSH: 1.947 u[IU]/mL (ref 0.350–4.500)

## 2020-07-30 LAB — ETHANOL: Alcohol, Ethyl (B): <10 mg/dL (ref <10)

## 2020-07-30 LAB — FERRITIN: Ferritin: 1116 ng/mL — ABNORMAL HIGH (ref 11–307)

## 2020-07-30 LAB — C-REACTIVE PROTEIN: CRP: 5.3 mg/dL — ABNORMAL HIGH (ref <1.0)

## 2020-07-30 LAB — PROTIME-INR
INR: 1.1 (ref 0.8–1.2)
Prothrombin Time: 13.5 seconds (ref 11.4–15.2)

## 2020-07-30 LAB — LACTIC ACID, PLASMA: Lactic Acid, Venous: 1.9 mmol/L (ref 0.5–1.9)

## 2020-07-30 LAB — D-DIMER, QUANTITATIVE: D-Dimer, Quant: 1.67 ug/mL-FEU — ABNORMAL HIGH (ref 0.00–0.50)

## 2020-07-30 LAB — APTT: aPTT: 30 seconds (ref 24–36)

## 2020-07-30 LAB — CBG MONITORING, ED: Glucose-Capillary: 309 mg/dL — ABNORMAL HIGH (ref 70–99)

## 2020-07-30 LAB — LACTATE DEHYDROGENASE: LDH: 538 U/L — ABNORMAL HIGH (ref 98–192)

## 2020-07-30 LAB — PROCALCITONIN: Procalcitonin: 0.15 ng/mL

## 2020-07-30 LAB — FIBRINOGEN: Fibrinogen: 556 mg/dL — ABNORMAL HIGH (ref 210–475)

## 2020-07-30 MED ORDER — ONDANSETRON HCL 4 MG PO TABS: 4.0000 mg | ORAL_TABLET | Freq: Four times a day (QID) | ORAL | Status: DC | PRN

## 2020-07-30 MED ORDER — SODIUM CHLORIDE 0.9 % IV SOLN
100.0000 mg | Freq: Every day | INTRAVENOUS | Status: AC
  Administered 2020-07-31 – 2020-08-03 (×4): 100 mg via INTRAVENOUS
  Filled 2020-07-30 (×4): qty 20

## 2020-07-30 MED ORDER — POLYETHYLENE GLYCOL 3350 17 G PO PACK: 17.0000 g | PACK | Freq: Every day | ORAL | Status: DC | PRN

## 2020-07-30 MED ORDER — GUAIFENESIN-DM 100-10 MG/5ML PO SYRP
10.0000 mL | ORAL_SOLUTION | ORAL | Status: DC | PRN
  Administered 2020-08-01 – 2020-08-05 (×3): 10 mL via ORAL
  Filled 2020-07-30 (×3): qty 10

## 2020-07-30 MED ORDER — SODIUM CHLORIDE 0.9 % IV SOLN
100.0000 mg | Freq: Once | INTRAVENOUS | Status: AC
  Administered 2020-07-30: 100 mg via INTRAVENOUS
  Filled 2020-07-30: qty 20

## 2020-07-30 MED ORDER — SODIUM CHLORIDE 0.9 % IV SOLN: 100.0000 mg | Freq: Every day | INTRAVENOUS | Status: DC

## 2020-07-30 MED ORDER — DEXAMETHASONE SODIUM PHOSPHATE 10 MG/ML IJ SOLN
10.0000 mg | Freq: Once | INTRAMUSCULAR | Status: AC
  Administered 2020-07-30: 10 mg via INTRAVENOUS
  Filled 2020-07-30: qty 1

## 2020-07-30 MED ORDER — INSULIN ASPART 100 UNIT/ML ~~LOC~~ SOLN
0.0000 [IU] | SUBCUTANEOUS | Status: DC
  Administered 2020-07-30: 8 [IU] via SUBCUTANEOUS
  Administered 2020-07-31: 11 [IU] via SUBCUTANEOUS
  Filled 2020-07-30 (×2): qty 1

## 2020-07-30 MED ORDER — LABETALOL HCL 5 MG/ML IV SOLN: 10.0000 mg | INTRAVENOUS | Status: DC | PRN

## 2020-07-30 MED ORDER — ACETAMINOPHEN 325 MG PO TABS
650.0000 mg | ORAL_TABLET | Freq: Four times a day (QID) | ORAL | Status: DC | PRN
  Administered 2020-08-02: 650 mg via ORAL
  Filled 2020-07-30: qty 2

## 2020-07-30 MED ORDER — SODIUM CHLORIDE 0.9 % IV SOLN
200.0000 mg | Freq: Once | INTRAVENOUS | Status: AC
  Administered 2020-07-30: 200 mg via INTRAVENOUS
  Filled 2020-07-30: qty 40

## 2020-07-30 MED ORDER — ONDANSETRON HCL 4 MG/2ML IJ SOLN: 4.0000 mg | Freq: Four times a day (QID) | INTRAMUSCULAR | Status: DC | PRN

## 2020-07-30 MED ORDER — ENOXAPARIN SODIUM 40 MG/0.4ML ~~LOC~~ SOLN
40.0000 mg | SUBCUTANEOUS | Status: DC
  Administered 2020-07-30 – 2020-08-04 (×6): 40 mg via SUBCUTANEOUS
  Filled 2020-07-30 (×6): qty 0.4

## 2020-07-30 MED ORDER — SODIUM CHLORIDE 0.9 % IV SOLN
100.0000 mg | Freq: Every day | INTRAVENOUS | Status: DC
  Filled 2020-07-30: qty 20

## 2020-07-30 MED ORDER — ASPIRIN 300 MG RE SUPP
300.0000 mg | Freq: Once | RECTAL | Status: AC
  Administered 2020-07-30: 300 mg via RECTAL
  Filled 2020-07-30: qty 1

## 2020-07-30 MED ORDER — DEXAMETHASONE SODIUM PHOSPHATE 10 MG/ML IJ SOLN: 6.0000 mg | INTRAMUSCULAR | Status: DC

## 2020-07-30 MED ORDER — POTASSIUM CHLORIDE IN NACL 40-0.9 MEQ/L-% IV SOLN
INTRAVENOUS | Status: DC
  Filled 2020-07-30 (×5): qty 1000

## 2020-07-30 NOTE — ED Notes (Signed)
Date and time results received: 07/30/20 1912 (use smartphrase ".now" to insert current time)  Test: Covid Critical Value: Positive  Name of Provider Notified: Long, MD  Orders Received? Or Actions Taken?:

## 2020-07-30 NOTE — ED Notes (Signed)
Sats fluctuating between 85-88 % O2 placed at 2 Lmin via [place].

## 2020-07-30 NOTE — ED Provider Notes (Signed)
Emergency Department Provider Note   I have reviewed the triage vital signs and the nursing notes.   HISTORY  Chief Complaint Weakness   HPI [name] is a 74 y.o. female with PMH reviewed below presents to the emergency department with altered mental status since awaking this morning.  Patient was last normal at 7 PM yesterday and awoke this morning with slow responses, generalized weakness, inability to walk.  She is accompanied by her daughter here in the emergency department.  Patient has developed a headache starting she thinks last night.  Denies similar headache in the past.  Does not recall the specific onset or progression of symptoms.  Denies any abdominal or chest pain.  No shortness of breath. No new medications or recent med changes.   Level 5 caveat: AMS    Past Medical History:  Diagnosis Date  . Diabetes mellitus without complication ([place])   . High cholesterol   . Hypertension     Patient Active Problem List   Diagnosis Date Noted  . Uncontrolled type 2 diabetes mellitus with hyperglycemia ([place]) 07/31/2020  . Acute respiratory failure with hypoxia ([place]) 07/31/2020  . Essential hypertension 07/30/2020  . Acute metabolic encephalopathy 28/36/6294  . Gastroesophageal reflux disease with esophagitis 06/01/2018  . Diverticulosis 04/28/2018  . Diabetes ([place]) 04/04/2018  . History of adenomatous polyp of colon 04/04/2018  . Hyperlipidemia 04/04/2018    Past Surgical History:  Procedure Laterality Date  . FOOT SURGERY      Allergies Patient has no known allergies.  History reviewed. No pertinent family history.  Social History Social History   Tobacco Use  . Smoking status: Never Smoker  . Smokeless tobacco: Never Used  Vaping Use  . Vaping Use: Never used  Substance Use Topics  . Alcohol use: Never  . Drug use: Never    Review of Systems  Constitutional: No fever/chills. Positive generalized weakness.  Eyes: No visual changes. ENT: No  sore throat. Cardiovascular: Denies chest pain. Respiratory: Denies shortness of breath. Gastrointestinal: No abdominal pain.  No nausea, no vomiting.  No diarrhea.  No constipation. Genitourinary: Negative for dysuria. Musculoskeletal: Negative for back pain. Skin: Negative for rash. Neurological: Negative for focal weakness or numbness. Positive HA.   10-point ROS otherwise negative.  ____________________________________________   PHYSICAL EXAM:  VITAL SIGNS: ED Triage Vitals  Enc Vitals Group     BP 07/30/20 1509 (!) 146/66     Pulse Rate 07/30/20 1509 81     Resp 07/30/20 1509 18     Temp 07/30/20 1509 98.4 F (36.9 C)     Temp Source 07/30/20 1509 Oral     SpO2 07/30/20 1509 (!) 87 %     Weight 07/30/20 1515 149 lb 0.5 oz (67.6 kg)     Height 07/30/20 1515 5' (1.524 m)   Constitutional: Alert and oriented. Patient somewhat slow to respond and history is limited.  Eyes: Conjunctivae are normal. PERRL. Head: Atraumatic. Nose: No congestion/rhinnorhea. Mouth/Throat: Mucous membranes are moist.   Neck: No stridor.   Cardiovascular: Normal rate, regular rhythm. Good peripheral circulation. Grossly normal heart sounds.   Respiratory: Normal respiratory effort.  No retractions. Lungs CTAB. Gastrointestinal: Soft and nontender. No distention.  Musculoskeletal: No lower extremity tenderness nor edema. No gross deformities of extremities. Neurologic: No specific dysarthria. 4+/5 strength throughout with question of more focal RLE weakness but anti-gravity at 4/5. No facial asymmetry.  Skin:  Skin is warm, dry and intact. No rash noted.  ____________________________________________  LABS (all labs ordered are listed, but only abnormal results are displayed)  Labs Reviewed  RESPIRATORY PANEL BY RT PCR (FLU A&B, COVID) - Abnormal; Notable for the following components:      Result Value   SARS Coronavirus 2 by RT PCR POSITIVE (*)    All other components within normal limits   DIFFERENTIAL - Abnormal; Notable for the following components:   Lymphs Abs 0.5 (*)    All other components within normal limits  COMPREHENSIVE METABOLIC PANEL - Abnormal; Notable for the following components:   Sodium 130 (*)    Potassium 3.3 (*)    Chloride 88 (*)    Glucose, Bld 307 (*)    Calcium 8.4 (*)    Albumin 3.4 (*)    AST 68 (*)    ALT 45 (*)    All other components within normal limits  D-DIMER, QUANTITATIVE (NOT AT [facility]) - Abnormal; Notable for the following components:   D-Dimer, Quant 1.67 (*)    All other components within normal limits  LACTATE DEHYDROGENASE - Abnormal; Notable for the following components:   LDH 538 (*)    All other components within normal limits  FERRITIN - Abnormal; Notable for the following components:   Ferritin 1,116 (*)    All other components within normal limits  TRIGLYCERIDES - Abnormal; Notable for the following components:   Triglycerides 172 (*)    All other components within normal limits  FIBRINOGEN - Abnormal; Notable for the following components:   Fibrinogen 556 (*)    All other components within normal limits  C-REACTIVE PROTEIN - Abnormal; Notable for the following components:   CRP 5.3 (*)    All other components within normal limits  CBC WITH DIFFERENTIAL/PLATELET - Abnormal; Notable for the following components:   WBC 2.8 (*)    Hemoglobin 11.5 (*)    HCT 34.6 (*)    Lymphs Abs 0.6 (*)    All other components within normal limits  COMPREHENSIVE METABOLIC PANEL - Abnormal; Notable for the following components:   Sodium 132 (*)    Chloride 93 (*)    Glucose, Bld 314 (*)    Calcium 8.1 (*)    Total Protein 5.6 (*)    Albumin 2.7 (*)    AST 46 (*)    Alkaline Phosphatase 32 (*)    All other components within normal limits  C-REACTIVE PROTEIN - Abnormal; Notable for the following components:   CRP 4.1 (*)    All other components within normal limits  D-DIMER, QUANTITATIVE (NOT AT [facility]) - Abnormal; Notable for  the following components:   D-Dimer, Quant 1.07 (*)    All other components within normal limits  FERRITIN - Abnormal; Notable for the following components:   Ferritin 785 (*)    All other components within normal limits  HEMOGLOBIN A1C - Abnormal; Notable for the following components:   Hgb A1c MFr Bld 8.7 (*)    All other components within normal limits  GLUCOSE, CAPILLARY - Abnormal; Notable for the following components:   Glucose-Capillary 169 (*)    All other components within normal limits  C-REACTIVE PROTEIN - Abnormal; Notable for the following components:   CRP 1.2 (*)    All other components within normal limits  D-DIMER, QUANTITATIVE (NOT AT [facility]) - Abnormal; Notable for the following components:   D-Dimer, Quant 0.99 (*)    All other components within normal limits  FERRITIN - Abnormal; Notable for the following components:   Ferritin  670 (*)    All other components within normal limits  GLUCOSE, CAPILLARY - Abnormal; Notable for the following components:   Glucose-Capillary 220 (*)    All other components within normal limits  COMPREHENSIVE METABOLIC PANEL - Abnormal; Notable for the following components:   Sodium 133 (*)    Chloride 97 (*)    Glucose, Bld 240 (*)    Calcium 8.2 (*)    Total Protein 5.3 (*)    Albumin 2.7 (*)    AST 48 (*)    Alkaline Phosphatase 33 (*)    All other components within normal limits  GLUCOSE, CAPILLARY - Abnormal; Notable for the following components:   Glucose-Capillary 295 (*)    All other components within normal limits  GLUCOSE, CAPILLARY - Abnormal; Notable for the following components:   Glucose-Capillary 283 (*)    All other components within normal limits  GLUCOSE, CAPILLARY - Abnormal; Notable for the following components:   Glucose-Capillary 186 (*)    All other components within normal limits  CBG MONITORING, ED - Abnormal; Notable for the following components:   Glucose-Capillary 309 (*)    All other components  within normal limits  CBG MONITORING, ED - Abnormal; Notable for the following components:   Glucose-Capillary 307 (*)    All other components within normal limits  CBG MONITORING, ED - Abnormal; Notable for the following components:   Glucose-Capillary 255 (*)    All other components within normal limits  CBG MONITORING, ED - Abnormal; Notable for the following components:   Glucose-Capillary 244 (*)    All other components within normal limits  CULTURE, BLOOD (ROUTINE X 2)  CULTURE, BLOOD (ROUTINE X 2)  ETHANOL  PROTIME-INR  APTT  CBC  LACTIC ACID, PLASMA  PROCALCITONIN  MAGNESIUM  PHOSPHORUS  TSH  MAGNESIUM  PHOSPHORUS  CBC WITH DIFFERENTIAL/PLATELET  CBC WITH DIFFERENTIAL/PLATELET  CBC WITH DIFFERENTIAL/PLATELET  CBC WITH DIFFERENTIAL/PLATELET  COMPREHENSIVE METABOLIC PANEL  C-REACTIVE PROTEIN  D-DIMER, QUANTITATIVE (NOT AT [facility])  FERRITIN  MAGNESIUM  PHOSPHORUS   ____________________________________________  EKG  Rate: 81 PR: 144 QTc: 443  Sinus rhythm. Narrow QRS. Slight rightward axis. Nonspecific ST changes. No STEMI.  ____________________________________________  RADIOLOGY  CT head and CXR reviewed.  ____________________________________________   PROCEDURES  Procedure(s) performed:   Procedures  CRITICAL CARE Performed by: Margette Fast Total critical care time: 35 minutes Critical care time was exclusive of separately billable procedures and treating other patients. Critical care was necessary to treat or prevent imminent or life-threatening deterioration. Critical care was time spent personally by me on the following activities: development of treatment plan with patient and/or surrogate as well as nursing, discussions with consultants, evaluation of patient's response to treatment, examination of patient, obtaining history from patient or surrogate, ordering and performing treatments and interventions, ordering and review of laboratory  studies, ordering and review of radiographic studies, pulse oximetry and re-evaluation of patient's condition.  Nanda Quinton, MD Emergency Medicine  ____________________________________________   INITIAL IMPRESSION / ASSESSMENT AND PLAN / ED COURSE  Pertinent labs & imaging results that were available during my care of the patient were reviewed by me and considered in my medical decision making (see chart for details).   Patient presents to the emergency department with altered mental status noticed this morning.  Patient last normal at 7 PM yesterday.  Question some mild weakness in the right lower extremity but patient seems more globally weak and somewhat encephalopathic.  Differential is broad at this time  but stroke does remain on the differential.  Could also be toxic/metabolic.  No new medications.  Plan for CT imaging of the head and lab work including UA.  EKG is nonischemic.  No specific UTI symptoms but history is limited.  Vitals with mild hypoxemia.   Labs and imaging reviewed. Patient is COVID positive. Will start decadron and antivirals with hypoxemia here. No acute distress. Low CVA suspicion.   Discussed patient's case with [facility] to request admission. Patient and family (if present) updated with plan. Care transferred to [facility] service.  I reviewed all nursing notes, vitals, pertinent old records, EKGs, labs, imaging (as available).    INGRI DIEMER was evaluated in Emergency Department on 08/01/2020 for the symptoms described in the history of present illness. She was evaluated in the context of the global COVID-19 pandemic, which necessitated consideration that the patient might be at risk for infection with the SARS-CoV-2 virus that causes COVID-19. Institutional protocols and algorithms that pertain to the evaluation of patients at risk for COVID-19 are in a state of rapid change based on information released by regulatory bodies including the CDC and federal and state  organizations. These policies and algorithms were followed during the patient's care in the ED.   ____________________________________________  FINAL CLINICAL IMPRESSION(S) / ED DIAGNOSES  Final diagnoses:  Encephalopathy acute  COVID-19     MEDICATIONS GIVEN DURING THIS VISIT:  Medications  enoxaparin (LOVENOX) injection 40 mg (40 mg Subcutaneous Given 07/31/20 2024)  guaiFENesin-dextromethorphan (ROBITUSSIN DM) 100-10 MG/5ML syrup 10 mL (10 mLs Oral Given 08/01/20 9242)  acetaminophen (TYLENOL) tablet 650 mg (has no administration in time range)  ondansetron (ZOFRAN) tablet 4 mg (has no administration in time range)    Or  ondansetron (ZOFRAN) injection 4 mg (has no administration in time range)  polyethylene glycol (MIRALAX / GLYCOLAX) packet 17 g (has no administration in time range)  0.9 % NaCl with KCl 40 mEq / L  infusion ( Intravenous New Bag/Given 08/01/20 0615)  remdesivir 200 mg in sodium chloride 0.9% 250 mL IVPB (0 mg Intravenous Stopped 07/30/20 2253)    Followed by  remdesivir 100 mg in sodium chloride 0.9 % 100 mL IVPB (0 mg Intravenous Stopped 08/01/20 1030)  insulin aspart (novoLOG) injection 0-20 Units (4 Units Subcutaneous Given 08/01/20 1717)  insulin aspart (novoLOG) injection 0-5 Units (2 Units Subcutaneous Given 07/31/20 2025)  methylPREDNISolone sodium succinate (SOLU-MEDROL) 125 mg/2 mL injection 80 mg (80 mg Intravenous Given 08/01/20 1715)  ascorbic acid (VITAMIN C) tablet 500 mg (500 mg Oral Given 08/01/20 0954)  zinc sulfate capsule 220 mg (220 mg Oral Given 08/01/20 0954)  baricitinib (OLUMIANT) tablet 2 mg (2 mg Oral Given 08/01/20 1230)  insulin glargine (LANTUS) injection 20 Units (has no administration in time range)  insulin aspart (novoLOG) injection 4 Units (4 Units Subcutaneous Given 08/01/20 1716)  dexamethasone (DECADRON) injection 10 mg (10 mg Intravenous Given 07/30/20 2010)  aspirin suppository 300 mg (300 mg Rectal Given 07/30/20 2252)    remdesivir 100 mg in sodium chloride 0.9 % 100 mL IVPB (0 mg Intravenous Stopped 07/30/20 2327)  methylPREDNISolone sodium succinate (SOLU-MEDROL) 40 mg/mL injection 40 mg (40 mg Intravenous Given 07/31/20 2024)  insulin glargine (LANTUS) injection 10 Units (10 Units Subcutaneous Given 08/01/20 1503)    Note:  This document was prepared using Dragon voice recognition software and may include unintentional dictation errors.  Nanda Quinton, MD, [facility] Emergency Medicine    Tommie Bohlken, [facility], MD 08/01/20 [phone]

## 2020-07-30 NOTE — H&P (Signed)
History and Physical    [name] VVO:[id number] DOB: 11-23-1945 DOA: 07/30/2020  PCP: Redmond School, MD   Patient coming from: Home  I have personally briefly reviewed patient's old medical records in [place]  Chief Complaint: Confusion, cannot walk  HPI: [name] is a 74 y.o. female with medical history significant for diabetes mellitus, hypertension, hypothyroidism. Patient was brought to the ED reports of confusion, patient not being as labile as usual, slow to respond, and not been able to walk, she was last seen normal yesterday night at about 7 PM. At the time of my evaluation, patient is slow to respond, barely muttering any words, though she is awake and alert.  She is unable to give me history.  Patient is not vaccinated.  Called patient's daughter listed on demographics- [name], no response.  ED Course: Temperature 98.4, tachypneic to 30, blood pressure 140s to 170s, O2 sats 85% on room air improved with 2 L O2 to greater than 92%.  Sodium 130, potassium 3.3, WBC 6.7.  EKG sinus rhythm unchanged.  Covid test positive.  Two-view chest x-ray suggestive of Covid pneumonia.  Head CT unremarkable.  Elevation in all her inflammatory markers. Remdesivir and dexamethasone 10 mg given.  Hospitalist to admit for Covid pneumonia.  Review of Systems: Unable to ascertain due to patient's mental status.  Past Medical History:  Diagnosis Date  . Diabetes mellitus without complication ([place])   . High cholesterol   . Hypertension     Past Surgical History:  Procedure Laterality Date  . FOOT SURGERY       reports that she has never smoked. She has never used smokeless tobacco. She reports that she does not drink alcohol and does not use drugs.  No Known Allergies  Unable to elicit family history due to patient's mental status unable to reach family at this time.  Prior to Admission medications   Medication Sig Start Date End Date Taking? Authorizing Provider    ACCU-CHEK AVIVA PLUS test strip TEST QID 01/25/18   [provider]  ACCU-CHEK FASTCLIX LANCETS MISC U TO TEST QID 01/21/18   [provider]  aspirin 325 MG tablet Take 325 mg by mouth daily.    [provider]  Calcium Citrate (CITRACAL PO) Take by mouth.    [provider]  celecoxib (CELEBREX) 200 MG capsule TAKE 1 CAPSULE BY MOUTH ONCE DAILY IF NEEDED 12/23/17   [provider]  Cholecalciferol (VITAMIN D PO) Take by mouth.    [provider]  Diclofenac Sodium 1.5 % SOLN APP EXT AA QID 01/21/18   [provider]  doxycycline (VIBRA-TABS) 100 MG tablet Take 1 tablet (100 mg total) by mouth 2 (two) times daily. 05/24/20   Edrick Kins, DPM  escitalopram (LEXAPRO) 10 MG tablet TK 1 T PO D 01/20/18   [provider]  glimepiride (AMARYL) 2 MG tablet TK 2 TS PO D 01/21/18   [provider]  hydrochlorothiazide (HYDRODIURIL) 25 MG tablet TK 1 T PO D 01/21/18   [provider]  levothyroxine (SYNTHROID, LEVOTHROID) 100 MCG tablet TK 1 T PO D 01/21/18   [provider]  losartan (COZAAR) 100 MG tablet  11/17/17   [provider]  meloxicam (MOBIC) 15 MG tablet TAKE 1 TABLET(15 MG) BY MOUTH DAILY 07/15/20   Edrick Kins, DPM  metFORMIN (GLUCOPHAGE) 500 MG tablet TK 1 T PO BID 12/31/17   [provider]  omeprazole (PRILOSEC)  20 MG capsule  01/21/18   [provider]  oxybutynin (DITROPAN XL) 15 MG 24 hr tablet TK 1 T PO D 01/21/18   [provider]  oxyCODONE-acetaminophen (PERCOCET) 5-325 MG tablet Take 1 tablet by mouth every 4 (four) hours as needed for severe pain. 05/16/20   Edrick Kins, DPM  simvastatin (ZOCOR) 40 MG tablet TK 1 T PO D 01/21/18   [provider]    Physical Exam: Exam limited by patient's mental status, patient not following directions.  Vitals:   07/30/20 1730 07/30/20 1800 07/30/20 1830 07/30/20 1900  BP: (!) 165/78 (!) 145/89 (!) 161/62  (!) 165/93  Pulse: 77 76 70 79  Resp:  (!) 25 (!) 30 (!) 30  Temp:      TempSrc:      SpO2: 92% 90% 92% 94%  Weight:      Height:        Constitutional: NAD, calm, comfortable Vitals:   07/30/20 1730 07/30/20 1800 07/30/20 1830 07/30/20 1900  BP: (!) 165/78 (!) 145/89 (!) 161/62 (!) 165/93  Pulse: 77 76 70 79  Resp:  (!) 25 (!) 30 (!) 30  Temp:      TempSrc:      SpO2: 92% 90% 92% 94%  Weight:      Height:       Eyes: PERRL, lids and conjunctivae normal ENMT: Unable to examine.  Neck: normal, supple, no masses, no thyromegaly Respiratory: Normal respiratory effort. No accessory muscle use.  Cardiovascular: Regular rate and rhythm,  No extremity edema. 2+ pedal pulses.   Abdomen: no tenderness, no masses palpated. No hepatosplenomegaly. Bowel sounds positive.  Musculoskeletal: no clubbing / cyanosis. No joint deformity upper and lower extremities. Good ROM, no contractures. Normal muscle tone.  Skin: no rashes, lesions, ulcers. No induration Neurologic: Limited by patient's mental status.  No apparent facial asymmetry, strong grip strength bilaterally, able to KEEP LEFT lower extremity up against gravity, but UNABLE to keep right lower extremity up against gravity. Psychiatric: Limited exam, awake and alert, but not answering questions.  Labs on Admission: I have personally reviewed following labs and imaging studies  CBC: Recent Labs  Lab 07/30/20 1542  WBC 6.7  NEUTROABS 5.9  HGB 12.9  HCT 38.2  MCV 89.0  PLT 628   Basic Metabolic Panel: Recent Labs  Lab 07/30/20 1542  NA 130*  K 3.3*  CL 88*  CO2 28  GLUCOSE 307*  BUN 18  CREATININE 0.80  CALCIUM 8.4*   Liver Function Tests: Recent Labs  Lab 07/30/20 1542  AST 68*  ALT 45*  ALKPHOS 40  BILITOT 0.8  PROT 6.8  ALBUMIN 3.4*   Coagulation Profile: Recent Labs  Lab 07/30/20 1542  INR 1.1    Radiological Exams on Admission: DG Chest 2 View  Result Date: 07/30/2020 CLINICAL DATA:  Hypoxemia  EXAM: CHEST - 2 VIEW COMPARISON:  2009 FINDINGS: There is interstitial prominence with patchy ill-defined density bilaterally at the lung bases primarily. No large pleural effusion. No pneumothorax. Cardiomediastinal contours are within normal limits. No acute osseous abnormality. Surgical clips overlie the right chest wall. IMPRESSION: Interstitial prominence and patchy ill-defined density at the lung bases, which may reflect edema or atypical/viral pneumonia. Electronically Signed   By: Macy Mis M.D.   On: 07/30/2020 16:23   CT HEAD WO CONTRAST  Result Date: 07/30/2020 CLINICAL DATA:  Altered mental status EXAM: CT HEAD WITHOUT CONTRAST TECHNIQUE: Contiguous axial images were obtained from the base  of the skull through the vertex without intravenous contrast. COMPARISON:  None. FINDINGS: Brain: Normal anatomic configuration. Parenchymal volume loss is commensurate with the patient's age. Mild periventricular white matter changes are present likely reflecting the sequela of small vessel ischemia. No abnormal intra or extra-axial mass lesion or fluid collection. No abnormal mass effect or midline shift. No evidence of acute intracranial hemorrhage or infarct. Ventricular size is normal. Cerebellum unremarkable. Vascular: No asymmetric hyperdense vasculature at the skull base. Skull: Intact Sinuses/Orbits: Paranasal sinuses are clear. Orbits are unremarkable. Other: Mastoid air cells and middle ear cavities are clear. IMPRESSION: Mild senescent change. No evidence of acute intracranial hemorrhage or infarct. Electronically Signed   By: Fidela Salisbury MD   On: 07/30/2020 16:35    EKG: Independently reviewed.  Sinus rhythm, rate 90, QTC 458.  No old EKG to compare.  Assessment/Plan Principal Problem:   Pneumonia due to COVID-19 virus Active Problems:   Diabetes ([place])   Essential hypertension   Acute metabolic encephalopathy    Pneumonia due to COVID-19 virus with acute hypoxic respiratory  failure-O2 sats down to 85% on room air, coughing.  Currently on 2 L of oxygen O2 sats greater than 92%.  two-view chest x-ray suggestive of viral pneumonia.  Elevation in inflammatory markers CRP, LDH, D-dimer, ferritin.  D-dimer 1.67.  Lactic acid 1.9. -Continue remdesivir -10 mg dexamethasone given, continue 6 mg daily -Hold oral medications-multivitamins, mucolytic's, remain n.p.o. -Albuterol inhaler, flutter valve as needed  Acute metabolic encephalopathy-confused, not talking ?Aphasia, right lower extremity appears weak.  Unable to ambulate in the setting of COVID-19 infection.  Head CT without acute abnormality.  Last known normal yesterday night. -Need to rule out possible stroke, MRI brain without contrast, MRA ordered for a.m. -Failed bedside swallow evaluation. -Remain n.p.o. -Echo, carotid Dopplers -Aspirin 300 mg rectally. -Pending MRI findings consider neurology consultation - Appears dehydrated, hydrate while NPO- N/s + 40 KCL 75cc/hr - Check TSH - ST, PT eval  Diabetes mellitus -random glucose 307.  She nods her head that she takes medications for diabetes but does not know when last she took her medications.   - SSI- M -Hold home alogliptin, glipizide, Metformin. - Hgba1c  Hypertension- elevated.  - prn labetalol -Hold home HCTZ, losartan.  DVT prophylaxis: Lovenox Code Status: Full code Family Communication: None at bedside, called patient's daughter, [name] listed on demographics, unable to reach family. Disposition Plan: ~ 2 days, pending treatment of pneumonia improvement/stability in respiratory status, and MRI findings. Consults called: None Admission status: Inpt, tele I certify that at the point of admission it is my clinical judgment that the patient will require inpatient hospital care spanning beyond 2 midnights from the point of admission due to high intensity of service, high risk for further deterioration and high frequency of surveillance  required. The following factors support the patient status of inpatient:    Bethena Roys MD [facility]  07/30/2020, 10:25 PM

## 2020-07-30 NOTE — ED Triage Notes (Addendum)
Pt with confusion, last seen normal last night at 1900.  Pt not as verbal as usual. Slow to respond per daughter.  Grips equal and no drift noted to bilateral arms but drift noted to right leg. Pt unable to walk which is new.  Daughter came to see pt around lunch time and noted symptoms.

## 2020-07-31 ENCOUNTER — Inpatient Hospital Stay (HOSPITAL_COMMUNITY): Payer: Medicare Other

## 2020-07-31 ENCOUNTER — Other Ambulatory Visit: Payer: Self-pay

## 2020-07-31 DIAGNOSIS — U071 COVID-19: Secondary | ICD-10-CM | POA: Diagnosis not present

## 2020-07-31 DIAGNOSIS — J1282 Pneumonia due to coronavirus disease 2019: Secondary | ICD-10-CM

## 2020-07-31 DIAGNOSIS — I340 Nonrheumatic mitral (valve) insufficiency: Secondary | ICD-10-CM

## 2020-07-31 DIAGNOSIS — E1165 Type 2 diabetes mellitus with hyperglycemia: Secondary | ICD-10-CM

## 2020-07-31 DIAGNOSIS — G9341 Metabolic encephalopathy: Secondary | ICD-10-CM | POA: Diagnosis not present

## 2020-07-31 DIAGNOSIS — J9601 Acute respiratory failure with hypoxia: Secondary | ICD-10-CM

## 2020-07-31 DIAGNOSIS — I10 Essential (primary) hypertension: Secondary | ICD-10-CM

## 2020-07-31 DIAGNOSIS — E11649 Type 2 diabetes mellitus with hypoglycemia without coma: Secondary | ICD-10-CM | POA: Insufficient documentation

## 2020-07-31 LAB — COMPREHENSIVE METABOLIC PANEL
ALT: 33 U/L (ref 0–44)
AST: 46 U/L — ABNORMAL HIGH (ref 15–41)
Albumin: 2.7 g/dL — ABNORMAL LOW (ref 3.5–5.0)
Alkaline Phosphatase: 32 U/L — ABNORMAL LOW (ref 38–126)
Anion gap: 12 (ref 5–15)
BUN: 18 mg/dL (ref 8–23)
CO2: 27 mmol/L (ref 22–32)
Calcium: 8.1 mg/dL — ABNORMAL LOW (ref 8.9–10.3)
Chloride: 93 mmol/L — ABNORMAL LOW (ref 98–111)
Creatinine, Ser: 0.73 mg/dL (ref 0.44–1.00)
GFR, Estimated: >60 mL/min (ref >60)
Glucose, Bld: 314 mg/dL — ABNORMAL HIGH (ref 70–99)
Potassium: 3.7 mmol/L (ref 3.5–5.1)
Sodium: 132 mmol/L — ABNORMAL LOW (ref 135–145)
Total Bilirubin: 0.8 mg/dL (ref 0.3–1.2)
Total Protein: 5.6 g/dL — ABNORMAL LOW (ref 6.5–8.1)

## 2020-07-31 LAB — CBG MONITORING, ED
Glucose-Capillary: 307 mg/dL — ABNORMAL HIGH (ref 70–99)
Glucose-Capillary: 255 mg/dL — ABNORMAL HIGH (ref 70–99)
Glucose-Capillary: 244 mg/dL — ABNORMAL HIGH (ref 70–99)

## 2020-07-31 LAB — D-DIMER, QUANTITATIVE: D-Dimer, Quant: 1.07 ug/mL-FEU — ABNORMAL HIGH (ref 0.00–0.50)

## 2020-07-31 LAB — CBC WITH DIFFERENTIAL/PLATELET
Abs Immature Granulocytes: 0.04 10*3/uL (ref 0.00–0.07)
Basophils Absolute: 0 10*3/uL (ref 0.0–0.1)
Basophils Relative: 0 %
Eosinophils Absolute: 0 10*3/uL (ref 0.0–0.5)
Eosinophils Relative: 0 %
HCT: 34.6 % — ABNORMAL LOW (ref 36.0–46.0)
Hemoglobin: 11.5 g/dL — ABNORMAL LOW (ref 12.0–15.0)
Immature Granulocytes: 1 %
Lymphocytes Relative: 22 %
Lymphs Abs: 0.6 10*3/uL — ABNORMAL LOW (ref 0.7–4.0)
MCH: 29.7 pg (ref 26.0–34.0)
MCHC: 33.2 g/dL (ref 30.0–36.0)
MCV: 89.4 fL (ref 80.0–100.0)
Monocytes Absolute: 0.2 10*3/uL (ref 0.1–1.0)
Monocytes Relative: 7 %
Neutro Abs: 1.9 10*3/uL (ref 1.7–7.7)
Neutrophils Relative %: 70 %
Platelets: 250 10*3/uL (ref 150–400)
RBC: 3.87 MIL/uL (ref 3.87–5.11)
RDW: 13.2 % (ref 11.5–15.5)
WBC: 2.8 10*3/uL — ABNORMAL LOW (ref 4.0–10.5)
nRBC: 0 % (ref 0.0–0.2)

## 2020-07-31 LAB — ECHOCARDIOGRAM COMPLETE
Area-P 1/2: 3 cm2
Height: 60 in
S' Lateral: 2.84 cm
Weight: 2384.5 oz

## 2020-07-31 LAB — PHOSPHORUS: Phosphorus: 2.9 mg/dL (ref 2.5–4.6)

## 2020-07-31 LAB — GLUCOSE, CAPILLARY
Glucose-Capillary: 169 mg/dL — ABNORMAL HIGH (ref 70–99)
Glucose-Capillary: 220 mg/dL — ABNORMAL HIGH (ref 70–99)

## 2020-07-31 LAB — FERRITIN: Ferritin: 785 ng/mL — ABNORMAL HIGH (ref 11–307)

## 2020-07-31 LAB — MAGNESIUM: Magnesium: 2.1 mg/dL (ref 1.7–2.4)

## 2020-07-31 LAB — C-REACTIVE PROTEIN: CRP: 4.1 mg/dL — ABNORMAL HIGH (ref <1.0)

## 2020-07-31 MED ORDER — METHYLPREDNISOLONE SODIUM SUCC 125 MG IJ SOLR
80.0000 mg | Freq: Two times a day (BID) | INTRAMUSCULAR | Status: DC
  Administered 2020-08-01 – 2020-08-05 (×9): 80 mg via INTRAVENOUS
  Filled 2020-07-31 (×9): qty 2

## 2020-07-31 MED ORDER — METHYLPREDNISOLONE SODIUM SUCC 40 MG IJ SOLR
40.0000 mg | Freq: Two times a day (BID) | INTRAMUSCULAR | Status: DC
  Administered 2020-07-31 (×2): 40 mg via INTRAVENOUS
  Filled 2020-07-31 (×2): qty 1

## 2020-07-31 MED ORDER — INSULIN GLARGINE 100 UNIT/ML ~~LOC~~ SOLN
10.0000 [IU] | Freq: Every day | SUBCUTANEOUS | Status: DC
  Administered 2020-07-31 – 2020-08-01 (×2): 10 [IU] via SUBCUTANEOUS
  Filled 2020-07-31 (×3): qty 0.1

## 2020-07-31 MED ORDER — INSULIN ASPART 100 UNIT/ML ~~LOC~~ SOLN
0.0000 [IU] | Freq: Three times a day (TID) | SUBCUTANEOUS | Status: DC
  Administered 2020-07-31: 4 [IU] via SUBCUTANEOUS
  Administered 2020-07-31: 7 [IU] via SUBCUTANEOUS
  Administered 2020-07-31: 11 [IU] via SUBCUTANEOUS
  Administered 2020-08-01: 4 [IU] via SUBCUTANEOUS
  Administered 2020-08-01 (×2): 11 [IU] via SUBCUTANEOUS
  Administered 2020-08-02: 7 [IU] via SUBCUTANEOUS
  Administered 2020-08-02: 11 [IU] via SUBCUTANEOUS
  Administered 2020-08-03 (×2): 4 [IU] via SUBCUTANEOUS
  Administered 2020-08-03 – 2020-08-04 (×2): 7 [IU] via SUBCUTANEOUS
  Administered 2020-08-04: 4 [IU] via SUBCUTANEOUS
  Administered 2020-08-05: 3 [IU] via SUBCUTANEOUS
  Filled 2020-07-31 (×2): qty 1

## 2020-07-31 MED ORDER — ASCORBIC ACID 500 MG PO TABS
500.0000 mg | ORAL_TABLET | Freq: Every day | ORAL | Status: DC
  Administered 2020-07-31 – 2020-08-05 (×6): 500 mg via ORAL
  Filled 2020-07-31 (×6): qty 1

## 2020-07-31 MED ORDER — METHYLPREDNISOLONE SODIUM SUCC 40 MG IJ SOLR
40.0000 mg | Freq: Once | INTRAMUSCULAR | Status: AC
  Administered 2020-07-31: 40 mg via INTRAVENOUS
  Filled 2020-07-31: qty 1

## 2020-07-31 MED ORDER — BARICITINIB 2 MG PO TABS
4.0000 mg | ORAL_TABLET | ORAL | Status: DC
  Administered 2020-07-31: 4 mg via ORAL
  Filled 2020-07-31: qty 2

## 2020-07-31 MED ORDER — ZINC SULFATE 220 (50 ZN) MG PO CAPS
220.0000 mg | ORAL_CAPSULE | Freq: Every day | ORAL | Status: DC
  Administered 2020-07-31 – 2020-08-05 (×6): 220 mg via ORAL
  Filled 2020-07-31 (×7): qty 1

## 2020-07-31 MED ORDER — INSULIN ASPART 100 UNIT/ML ~~LOC~~ SOLN
0.0000 [IU] | Freq: Every day | SUBCUTANEOUS | Status: DC
  Administered 2020-07-31: 2 [IU] via SUBCUTANEOUS

## 2020-07-31 NOTE — Progress Notes (Signed)
Inpatient Diabetes Program Recommendations  AACE/ADA: New Consensus Statement on Inpatient Glycemic Control (2015)  Target Ranges:  Prepandial:   less than 140 mg/dL      Peak postprandial:   less than 180 mg/dL (1-2 hours)      Critically ill patients:  140 - 180 mg/dL   Lab Results  Component Value Date   GLUCAP 255 (H) 07/31/2020    Review of Glycemic Control Results for [name], [name] (MRN [id number]) as of 07/31/2020 10:42  Ref. Range 07/31/2020 05:54 07/31/2020 07:40  Glucose-Capillary Latest Ref Range: 70 - 99 mg/dL 307 (H) 255 (H)   Diabetes history: Type 2 DM Outpatient Diabetes medications: Amaryl 4 mg QD, Metformin 500 mg BID Current orders for Inpatient glycemic control: Lantus 10 units QD, Novolog 0-20 units TID, Novolog 0-5 units QHS  Inpatient Diabetes Program Recommendations:    Noted A1C pending.   Consider increasing Lantus to 10 units BID and adding Tradjenta 5 mg QD.   Thanks, Bronson Curb, MSN, RNC-OB Diabetes Coordinator [phone] (8a-5p)

## 2020-07-31 NOTE — Plan of Care (Signed)
  Problem: Education: °Goal: Knowledge of General Education information will improve °Description: Including pain rating scale, medication(s)/side effects and non-pharmacologic comfort measures °Outcome: Progressing °  °Problem: Health Behavior/Discharge Planning: °Goal: Ability to manage health-related needs will improve °Outcome: Progressing °  °Problem: Clinical Measurements: °Goal: Ability to maintain clinical measurements within normal limits will improve °Outcome: Progressing °Goal: Will remain free from infection °Outcome: Progressing °Goal: Diagnostic test results will improve °Outcome: Progressing °Goal: Respiratory complications will improve °Outcome: Progressing °Goal: Cardiovascular complication will be avoided °Outcome: Progressing °  °Problem: Activity: °Goal: Risk for activity intolerance will decrease °Outcome: Progressing °  °Problem: Nutrition: °Goal: Adequate nutrition will be maintained °Outcome: Progressing °  °Problem: Coping: °Goal: Level of anxiety will decrease °Outcome: Progressing °  °Problem: Elimination: °Goal: Will not experience complications related to bowel motility °Outcome: Progressing °Goal: Will not experience complications related to urinary retention °Outcome: Progressing °  °Problem: Pain Managment: °Goal: General experience of comfort will improve °Outcome: Progressing °  °Problem: Safety: °Goal: Ability to remain free from injury will improve °Outcome: Progressing °  °Problem: Skin Integrity: °Goal: Risk for impaired skin integrity will decrease °Outcome: Progressing °  °Problem: Education: °Goal: Knowledge of risk factors and measures for prevention of condition will improve °Outcome: Progressing °  °Problem: Coping: °Goal: Psychosocial and spiritual needs will be supported °Outcome: Progressing °  °Problem: Respiratory: °Goal: Will maintain a patent airway °Outcome: Progressing °Goal: Complications related to the disease process, condition or treatment will be avoided or  minimized °Outcome: Progressing °  °Problem: Education: °Goal: Knowledge of risk factors and measures for prevention of condition will improve °Outcome: Progressing °  °Problem: Coping: °Goal: Psychosocial and spiritual needs will be supported °Outcome: Progressing °  °Problem: Respiratory: °Goal: Will maintain a patent airway °Outcome: Progressing °Goal: Complications related to the disease process, condition or treatment will be avoided or minimized °Outcome: Progressing °  °

## 2020-07-31 NOTE — Plan of Care (Signed)
  Problem: Acute Rehab PT Goals(only PT should resolve) Goal: Pt Will Go Supine/Side To Sit Outcome: Progressing Flowsheets (Taken 07/31/2020 1234) Pt will go Supine/Side to Sit:  with supervision  with min guard assist Goal: Patient Will Transfer Sit To/From Stand Outcome: Progressing Flowsheets (Taken 07/31/2020 1234) Patient will transfer sit to/from stand: with minimal assist Goal: Pt Will Transfer Bed To Chair/Chair To Bed Outcome: Progressing Flowsheets (Taken 07/31/2020 1234) Pt will Transfer Bed to Chair/Chair to Bed: with min assist Goal: Pt Will Ambulate Outcome: Progressing Flowsheets (Taken 07/31/2020 1234) Pt will Ambulate:  25 feet  with minimal assist  with moderate assist  with rolling walker   12:35 PM, 07/31/20 Lonell Grandchild, MPT Physical Therapist with [facility] [phone] office [phone] mobile phone

## 2020-07-31 NOTE — Progress Notes (Signed)
Instructed patient on how to use IS and what it does.  Patient was able to 5X, but did tell patient how often we like to see them do it.  Personal best was 500 ml.

## 2020-07-31 NOTE — Evaluation (Signed)
Physical Therapy Evaluation Patient Details Name: [name] MRN: [id number] DOB: [birth year] Today's Date: 07/31/2020   History of Present Illness  [name] is a 74 y.o. female with medical history significant for diabetes mellitus, hypertension, hypothyroidism.  Clinical Impression  Patient demonstrates slow labored movement for sitting up at bedside requiring use of bed rail and Min/mod assistance, unable to maintain standing balance without AD secondary to BLE weakness, required use of RW and limited to a few steps at bedside due to fatigue/poor standing balance.  Patient put back to bed after therapy.  Patient will benefit from continued physical therapy in hospital and recommended venue below to increase strength, balance, endurance for safe ADLs and gait.     Follow Up Recommendations SNF    Equipment Recommendations  None recommended by PT    Recommendations for Other Services       Precautions / Restrictions Precautions Precautions: Fall Restrictions Weight Bearing Restrictions: No      Mobility  Bed Mobility Overal bed mobility: Needs Assistance Bed Mobility: Supine to Sit;Sit to Supine     Supine to sit: Min assist Sit to supine: Min guard   General bed mobility comments: increased time, labored movement    Transfers Overall transfer level: Needs assistance Equipment used: Rolling walker (2 wheeled);None Transfers: Sit to/from [facility] Sit to Stand: Mod assist Stand pivot transfers: Mod assist       General transfer comment: unable to maintain standing balance without AD, required RW, unsteady on feet with labored movement  Ambulation/Gait Ambulation/Gait assistance: Mod assist Gait Distance (Feet): 5 Feet Assistive device: Rolling walker (2 wheeled) Gait Pattern/deviations: Decreased step length - right;Decreased step length - left;Decreased stride length Gait velocity: decreased   General Gait Details: limited to 5-6 slow  labored steps at bedside due to BLE weakness and fall risk  Stairs            Wheelchair Mobility    Modified Rankin (Stroke Patients Only)       Balance Overall balance assessment: Needs assistance Sitting-balance support: No upper extremity supported;Feet supported Sitting balance-Leahy Scale: Fair Sitting balance - Comments: seated at EOB   Standing balance support: No upper extremity supported;During functional activity Standing balance-Leahy Scale: Poor Standing balance comment: fair/poor using RW                             Pertinent Vitals/Pain Pain Assessment: No/denies pain    Home Living Family/patient expects to be discharged to:: Private residence Living Arrangements: Children Available Help at Discharge: Family;Available 24 hours/day Type of Home: Mobile home Home Access: Ramped entrance     Home Layout: One level Home Equipment: [place] - 2 wheels;Walker - 4 wheels;Bedside commode;Shower seat;Grab bars - tub/shower      Prior Function Level of Independence: Independent         Comments: Hydrographic surveyor, drives     Journalist, newspaper        Extremity/Trunk Assessment   Upper Extremity Assessment Upper Extremity Assessment: Generalized weakness    Lower Extremity Assessment Lower Extremity Assessment: Generalized weakness    Cervical / Trunk Assessment Cervical / Trunk Assessment: Normal  Communication   Communication: No difficulties  Cognition Arousal/Alertness: Awake/alert Behavior During Therapy: WFL for tasks assessed/performed Overall Cognitive Status: Within Functional Limits for tasks assessed  General Comments      Exercises     Assessment/Plan    PT Assessment Patient needs continued PT services  PT Problem List Decreased strength;Decreased activity tolerance;Decreased balance;Decreased mobility       PT Treatment Interventions DME  instruction;Gait training;Stair training;Functional mobility training;Therapeutic activities;Therapeutic exercise;Balance training;Patient/family education    PT Goals (Current goals can be found in the Care Plan section)  Acute Rehab PT Goals Patient Stated Goal: return home with family to assist PT Goal Formulation: With patient Time For Goal Achievement: 08/14/20 Potential to Achieve Goals: Good    Frequency Min 3X/week   Barriers to discharge        Co-evaluation               AM-PAC PT "6 Clicks" Mobility  Outcome Measure Help needed turning from your back to your side while in a flat bed without using bedrails?: A Little Help needed moving from lying on your back to sitting on the side of a flat bed without using bedrails?: A Lot Help needed moving to and from a bed to a chair (including a wheelchair)?: A Lot Help needed standing up from a chair using your arms (e.g., wheelchair or bedside chair)?: A Lot Help needed to walk in hospital room?: A Lot Help needed climbing 3-5 steps with a railing? : A Lot 6 Click Score: 13    End of Session Equipment Utilized During Treatment: Oxygen Activity Tolerance: Patient tolerated treatment well;Patient limited by fatigue Patient left: in bed;with call bell/phone within reach Nurse Communication: Mobility status PT Visit Diagnosis: Unsteadiness on feet (R26.81);Other abnormalities of gait and mobility (R26.89);Muscle weakness (generalized) (M62.81)    Time: 7948-0165 PT Time Calculation (min) (ACUTE ONLY): 28 min   Charges:   PT Evaluation $PT Eval Moderate Complexity: 1 Mod PT Treatments $Therapeutic Activity: 23-37 mins        12:33 PM, 07/31/20 Lonell Grandchild, MPT Physical Therapist with [facility] [phone] office [phone] mobile phone

## 2020-07-31 NOTE — Progress Notes (Signed)
SLP Cancellation Note  Patient Details Name: [name] MRN: [id number] DOB: [birth year]   Cancelled treatment:       Reason Eval/Treat Not Completed: SLP screened, no needs identified, will sign off. MD and RN reported no SLP needs at this time. Pt has been consuming meals today without incident. MRI was negative for acute changes. SLP will sign off. Please reconsult if needs arise.   Thank you,  Genene Churn, [place]    [place] 07/31/2020, 12:20 PM

## 2020-07-31 NOTE — ED Notes (Signed)
PT working with patient at this time.  

## 2020-07-31 NOTE — ED Notes (Signed)
Patient transported to MRI at this time. 

## 2020-07-31 NOTE — Progress Notes (Signed)
PROGRESS NOTE  [name]:[id number] DOB: 08/27/1946 DOA: 07/30/2020 PCP: Redmond School, MD  Brief History:  74 year old female with a history of diabetes mellitus type 2, hypertension, hypothyroidism, diverticulosis presenting with generalized weakness and confusion.  Daughter noted patient was more somnolent on 10/18 and worsened on 10/19.  Apparently, the patient was slow to respond and barely mumbling words at home.  As result, the patient was brought to the emergency department for further evaluation.  At the time of my evaluation, the patient was awake and alert and answering simple questions.  She had denied any fevers, chills, chest pain, shortness breath, coughing, hemoptysis, diarrhea, abdominal pain, dysuria, hematuria.  She did have one episode of nausea/vomiting prior to coming to the emergency department.  She complained of generalized weakness with difficulty getting out of bed. She states that she is not vaccinated against COVID-19.  She stated that her daughter with whom she lives was diagnosed with Covid approximately 2-1/2 weeks prior to this admission. In the emergency department, the patient was afebrile hemodynamically stable.  Oxygen saturation was 85% on room air.  COVID-19 PCR RT-PCR was positive.  The patient was started on remdesivir and IV steroids.  Assessment/Plan: Acute respiratory failure with hypoxia due to COVID-19 pneumonia -Currently stable on 3 L with oxygen saturation 92-94% -Continue remdesivir -Switch to IV Solu-Medrol -Vitamin C and zinc -Incentive spirometry -Ferritin 1116>> 785 -CRP 5.3>> 4.1 -D-dimer 1.67>> 1.07 -PCT 0.15 -Personally reviewed chest x-ray--bilateral patchy interstitial opacities, right greater than left  Acute metabolic encephalopathy -Secondary to hypoxia and COVID-19 pneumonia -Mental status gradually improving, but not at baseline -TSH 1.947 -Personally reviewed EKG--sinus rhythm, nonspecific T wave  change  Uncontrolled diabetes mellitus type 2 with hyperglycemia -NovoLog sliding scale -Hemoglobin A1c -Start Lantus 10 units daily -Holding Metformin, glimepiride, alogliptin  Essential hypertension -Holding losartan, HCTZ  Hyperlipidemia -Restart simvastatin  Hypokalemia -Repleted -Magnesium 2.1  Hyponatremia -Secondary to poor solute intake  Depression/anxiety -Restart Lexapro     Status is: Inpatient  Remains inpatient appropriate because:Inpatient level of care appropriate due to severity of illness   Dispo: The patient is from: Home              Anticipated d/c is to: Home              Anticipated d/c date is: 2 days              Patient currently is not medically stable to d/c.        Family Communication: updated daughter  Consultants:  none  Code Status:  FULL  DVT Prophylaxis: [place] Lovenox   Procedures: As Listed in Progress Note Above  Antibiotics: None      Subjective: Patient denies fevers, chills, headache, chest pain, dyspnea, nausea, vomiting, diarrhea, abdominal pain, dysuria, hematuria, hematochezia, and melena. +dry cough  Objective: Vitals:   07/31/20 0430 07/31/20 0600 07/31/20 0630 07/31/20 0700  BP: (!) 145/65 (!) 148/63 (!) 144/58 (!) 137/56  Pulse: (!) 55 (!) 57 (!) 54 (!) 57  Resp: (!) 27 20 (!) 25 (!) 29  Temp:      TempSrc:      SpO2: 97% 92% 90%   Weight:      Height:        Intake/Output Summary (Last 24 hours) at 07/31/2020 0802 Last data filed at 07/30/2020 2253 Gross per 24 hour  Intake 100 ml  Output --  Net 100 ml  Weight change:  Exam:   General:  Pt is alert, follows commands appropriately, not in acute distress  HEENT: No icterus, No thrush, No neck mass, [place]/AT  Cardiovascular: RRR, S1/S2, no rubs, no gallops  Respiratory: bilateral crackles. No wheeze  Abdomen: Soft/+BS, non tender, non distended, no guarding  Extremities: No edema, No lymphangitis, No petechiae, No rashes, no  synovitis  Neuro:  CN II-XII intact, strength 4/5 in RUE, RLE, strength 4/5 LUE, LLE; sensation intact bilateral; no dysmetria; babinski equivocal     Data Reviewed: I have personally reviewed following labs and imaging studies Basic Metabolic Panel: Recent Labs  Lab 07/30/20 1542 07/31/20 0535  NA 130* 132*  K 3.3* 3.7  CL 88* 93*  CO2 28 27  GLUCOSE 307* 314*  BUN 18 18  CREATININE 0.80 0.73  CALCIUM 8.4* 8.1*  MG  --  2.1  PHOS  --  2.9   Liver Function Tests: Recent Labs  Lab 07/30/20 1542 07/31/20 0535  AST 68* 46*  ALT 45* 33  ALKPHOS 40 32*  BILITOT 0.8 0.8  PROT 6.8 5.6*  ALBUMIN 3.4* 2.7*   No results for input(s): LIPASE, AMYLASE in the last 168 hours. No results for input(s): AMMONIA in the last 168 hours. Coagulation Profile: Recent Labs  Lab 07/30/20 1542  INR 1.1   CBC: Recent Labs  Lab 07/30/20 1542 07/31/20 0535  WBC 6.7 2.8*  NEUTROABS 5.9 1.9  HGB 12.9 11.5*  HCT 38.2 34.6*  MCV 89.0 89.4  PLT 277 250   Cardiac Enzymes: No results for input(s): CKTOTAL, CKMB, CKMBINDEX, TROPONINI in the last 168 hours. BNP: Invalid input(s): POCBNP CBG: Recent Labs  Lab 07/30/20 2305 07/31/20 0554 07/31/20 0740  GLUCAP 309* 307* 255*   HbA1C: No results for input(s): HGBA1C in the last 72 hours. Urine analysis: No results found for: COLORURINE, APPEARANCEUR, LABSPEC, PHURINE, GLUCOSEU, HGBUR, BILIRUBINUR, KETONESUR, PROTEINUR, UROBILINOGEN, NITRITE, LEUKOCYTESUR Sepsis Labs: @LABRCNTIP (procalcitonin:4,lacticidven:4) ) Recent Results (from the past 240 hour(s))  Respiratory Panel by RT PCR (Flu A&B, Covid) - Nasopharyngeal Swab     Status: Abnormal   Collection Time: 07/30/20  6:03 PM   Specimen: Nasopharyngeal Swab  Result Value Ref Range Status   SARS Coronavirus 2 by RT PCR POSITIVE (A) NEGATIVE Final    Comment: RESULT CALLED TO, READ BACK BY AND VERIFIED WITH: T EASTER,RN@1912  07/30/20 MKELLY (NOTE) SARS-CoV-2 target nucleic  acids are DETECTED.  SARS-CoV-2 RNA is generally detectable in upper respiratory specimens  during the acute phase of infection. Positive results are indicative of the presence of the identified virus, but do not rule out bacterial infection or co-infection with other pathogens not detected by the test. Clinical correlation with patient history and other diagnostic information is necessary to determine patient infection status. The expected result is Negative.  Fact Sheet for Patients:  PinkCheek.be  Fact Sheet for Healthcare Providers: GravelBags.it  This test is not yet approved or cleared by the [place] FDA and  has been authorized for detection and/or diagnosis of SARS-CoV-2 by FDA under an Emergency Use Authorization (EUA).  This EUA will remain in effect (meaning this test can be [place] ed) for the duration of  the COVID-19 declaration under Section 564(b)(1) of the Act, 21 U.S.C. section 360bbb-3(b)(1), unless the authorization is terminated or revoked sooner.      Influenza A by PCR NEGATIVE NEGATIVE Final   Influenza B by PCR NEGATIVE NEGATIVE Final    Comment: (NOTE) The Xpert Xpress SARS-CoV-2/FLU/RSV assay is  intended as an aid in  the diagnosis of influenza from Nasopharyngeal swab specimens and  should not be used as a sole basis for treatment. Nasal washings and  aspirates are unacceptable for Xpert Xpress SARS-CoV-2/FLU/RSV  testing.  Fact Sheet for Patients: PinkCheek.be  Fact Sheet for Healthcare Providers: GravelBags.it  This test is not yet approved or cleared by the [place] FDA and  has been authorized for detection and/or diagnosis of SARS-CoV-2 by  FDA under an Emergency Use Authorization (EUA). This EUA will remain  in effect (meaning this test can be used) for the duration of the  Covid-19 declaration under Section 564(b)(1) of  the Act, 21  U.S.C. section 360bbb-3(b)(1), unless the authorization is  terminated or revoked. Performed at [facility], [address], [place], [place] [address]   Blood Culture (routine x 2)     Status: None (Preliminary result)   Collection Time: 07/30/20  8:00 PM   Specimen: Left Antecubital; Blood  Result Value Ref Range Status   Specimen Description LEFT ANTECUBITAL  Final   Special Requests   Final    BOTTLES DRAWN AEROBIC AND ANAEROBIC Blood Culture adequate volume   Culture   Final    NO GROWTH < 12 HOURS Performed at [facility], [address], [place], [place] [address]    Report Status PENDING  Incomplete  Blood Culture (routine x 2)     Status: None (Preliminary result)   Collection Time: 07/30/20  8:23 PM   Specimen: Left Antecubital; Blood  Result Value Ref Range Status   Specimen Description LEFT ANTECUBITAL  Final   Special Requests   Final    BOTTLES DRAWN AEROBIC AND ANAEROBIC Blood Culture adequate volume   Culture   Final    NO GROWTH < 12 HOURS Performed at [facility], [address], [place], [place] [address]    Report Status PENDING  Incomplete     Scheduled Meds: . enoxaparin (LOVENOX) injection  40 mg Subcutaneous Q24H  . insulin aspart  0-20 Units Subcutaneous TID WC  . insulin aspart  0-5 Units Subcutaneous QHS  . insulin glargine  10 Units Subcutaneous Daily  . methylPREDNISolone (SOLU-MEDROL) injection  40 mg Intravenous Q12H   Continuous Infusions: . 0.9 % NaCl with KCl 40 mEq / L 75 mL/hr at 07/30/20 2253  . remdesivir 100 mg in NS 100 mL      Procedures/Studies: DG Chest 2 View  Result Date: 07/30/2020 CLINICAL DATA:  Hypoxemia EXAM: CHEST - 2 VIEW COMPARISON:  2009 FINDINGS: There is interstitial prominence with patchy ill-defined density bilaterally at the lung bases primarily. No large pleural effusion. No pneumothorax. Cardiomediastinal contours are within normal limits. No acute osseous abnormality. Surgical clips overlie the  right chest wall. IMPRESSION: Interstitial prominence and patchy ill-defined density at the lung bases, which may reflect edema or atypical/viral pneumonia. Electronically Signed   By: Macy Mis M.D.   On: 07/30/2020 16:23   CT HEAD WO CONTRAST  Result Date: 07/30/2020 CLINICAL DATA:  Altered mental status EXAM: CT HEAD WITHOUT CONTRAST TECHNIQUE: Contiguous axial images were obtained from the base of the skull through the vertex without intravenous contrast. COMPARISON:  None. FINDINGS: Brain: Normal anatomic configuration. Parenchymal volume loss is commensurate with the patient's age. Mild periventricular white matter changes are present likely reflecting the sequela of small vessel ischemia. No abnormal intra or extra-axial mass lesion or fluid collection. No abnormal mass effect or midline shift. No evidence of acute intracranial hemorrhage or  infarct. Ventricular size is normal. Cerebellum unremarkable. Vascular: No asymmetric hyperdense vasculature at the skull base. Skull: Intact Sinuses/Orbits: Paranasal sinuses are clear. Orbits are unremarkable. Other: Mastoid air cells and middle ear cavities are clear. IMPRESSION: Mild senescent change. No evidence of acute intracranial hemorrhage or infarct. Electronically Signed   By: Fidela Salisbury MD   On: 07/30/2020 16:35   DG Foot Complete Right  Result Date: 07/16/2020 Please see detailed radiograph report in office note.   Orson Eva, DO  [facility]  If 7PM-7AM, please contact night-coverage [email or web address] Password [email or web address] 07/31/2020, 8:02 AM   LOS: 1 day

## 2020-07-31 NOTE — Progress Notes (Signed)
*  PRELIMINARY RESULTS* Echocardiogram 2D Echocardiogram has been performed.  [name] 07/31/2020, 10:07 AM

## 2020-07-31 NOTE — TOC Initial Note (Signed)
Transition of Care [facility]) - Initial/Assessment Note    Patient Details  Name: [name] MRN: [id number] Date of Birth: 01/21/1946  [facility]) CM/SW Contact:    Ihor Gully, LCSW Phone Number: 07/31/2020, 2:13 PM  Clinical Narrative:                 Patient from home with adult daughter. Admitted for Pneumonia due to COVID-19 virus. Independent at baseline, drives. PT evaluation/recommendation discussed with daughter, Ivin Booty. SNF declined. Will consider HHPT once patient is closer to d/c.  TOC will follow up regarding HHPT.  Expected Discharge Plan: [place] Barriers to Discharge: Continued Medical Work up   Patient Goals and CMS Choice Patient states their goals for this hospitalization and ongoing recovery are:: family wants patient to discharge home.   Choice offered to / list presented to : Adult Children  Expected Discharge Plan and Services Expected Discharge Plan: [place] Acute Care Choice: [place] arrangements for the past 2 months: Single Family Home                                      Prior Living Arrangements/Services Living arrangements for the past 2 months: Single Family Home Lives with:: Adult Children Patient language and need for interpreter reviewed:: Yes Do you feel safe going back to the place where you live?: Yes      Need for Family Participation in Patient Care: Yes (Comment) Care giver support system in place?: Yes (comment)   Criminal Activity/Legal Involvement Pertinent to Current Situation/Hospitalization: No - Comment as needed  Activities of Daily Living Home Assistive Devices/Equipment: CBG Meter ADL Screening (condition at time of admission) Patient's cognitive ability adequate to safely complete daily activities?: Yes Is the patient deaf or have difficulty hearing?: No Does the patient have difficulty seeing, even when wearing glasses/contacts?:  No Does the patient have difficulty concentrating, remembering, or making decisions?: No Patient able to express need for assistance with ADLs?: Yes Does the patient have difficulty dressing or bathing?: No Independently performs ADLs?: Yes (appropriate for developmental age) Does the patient have difficulty walking or climbing stairs?: No Weakness of Legs: None Weakness of Arms/Hands: None  Permission Sought/Granted Permission sought to share information with : Family Supports Permission granted to share information with : Yes, Release of Information Signed  Share Information with NAME: Cathie Olden     Permission granted to share info w Relationship: daughter     Emotional Assessment Appearance:: Appears stated age   Affect (typically observed): Appropriate Orientation: : Oriented to Self, Oriented to Place, Oriented to  Time, Oriented to Situation Alcohol / Substance Use: Not Applicable Psych Involvement: No (comment)  Admission diagnosis:  Encephalopathy acute [G93.40] Stroke ([place]) [I63.9] Pneumonia due to COVID-19 virus [U07.1, J12.82] COVID-19 [U07.1] Patient Active Problem List   Diagnosis Date Noted  . Uncontrolled type 2 diabetes mellitus with hyperglycemia ([place]) 07/31/2020  . Acute respiratory failure with hypoxia ([place]) 07/31/2020  . Essential hypertension 07/30/2020  . Acute metabolic encephalopathy 19/14/7829  . Gastroesophageal reflux disease with esophagitis 06/01/2018  . Diverticulosis 04/28/2018  . Diabetes ([place]) 04/04/2018  . History of adenomatous polyp of colon 04/04/2018  . Hyperlipidemia 04/04/2018   PCP:  Redmond School, MD Pharmacy:   [facility] [place], [place]  [place] [address] [place] [address] Phone: [phone] Fax: [phone]     Social Determinants of Health (SDOH) Interventions    Readmission Risk Interventions No flowsheet data found.

## 2020-08-01 DIAGNOSIS — J9601 Acute respiratory failure with hypoxia: Secondary | ICD-10-CM | POA: Diagnosis not present

## 2020-08-01 DIAGNOSIS — U071 COVID-19: Secondary | ICD-10-CM | POA: Diagnosis not present

## 2020-08-01 DIAGNOSIS — E1165 Type 2 diabetes mellitus with hyperglycemia: Secondary | ICD-10-CM | POA: Diagnosis not present

## 2020-08-01 DIAGNOSIS — G9341 Metabolic encephalopathy: Secondary | ICD-10-CM | POA: Diagnosis not present

## 2020-08-01 LAB — FERRITIN: Ferritin: 670 ng/mL — ABNORMAL HIGH (ref 11–307)

## 2020-08-01 LAB — COMPREHENSIVE METABOLIC PANEL
ALT: 31 U/L (ref 0–44)
AST: 48 U/L — ABNORMAL HIGH (ref 15–41)
Albumin: 2.7 g/dL — ABNORMAL LOW (ref 3.5–5.0)
Alkaline Phosphatase: 33 U/L — ABNORMAL LOW (ref 38–126)
Anion gap: 13 (ref 5–15)
BUN: 15 mg/dL (ref 8–23)
CO2: 23 mmol/L (ref 22–32)
Calcium: 8.2 mg/dL — ABNORMAL LOW (ref 8.9–10.3)
Chloride: 97 mmol/L — ABNORMAL LOW (ref 98–111)
Creatinine, Ser: 0.6 mg/dL (ref 0.44–1.00)
GFR, Estimated: >60 mL/min (ref >60)
Glucose, Bld: 240 mg/dL — ABNORMAL HIGH (ref 70–99)
Potassium: 4.9 mmol/L (ref 3.5–5.1)
Sodium: 133 mmol/L — ABNORMAL LOW (ref 135–145)
Total Bilirubin: 0.7 mg/dL (ref 0.3–1.2)
Total Protein: 5.3 g/dL — ABNORMAL LOW (ref 6.5–8.1)

## 2020-08-01 LAB — CBC WITH DIFFERENTIAL/PLATELET
Abs Immature Granulocytes: 0.05 10*3/uL (ref 0.00–0.07)
Basophils Absolute: 0 10*3/uL (ref 0.0–0.1)
Basophils Relative: 0 %
Eosinophils Absolute: 0 10*3/uL (ref 0.0–0.5)
Eosinophils Relative: 0 %
HCT: 38.8 % (ref 36.0–46.0)
Hemoglobin: 12.7 g/dL (ref 12.0–15.0)
Immature Granulocytes: 1 %
Lymphocytes Relative: 36 %
Lymphs Abs: 1.7 10*3/uL (ref 0.7–4.0)
MCH: 30.4 pg (ref 26.0–34.0)
MCHC: 32.7 g/dL (ref 30.0–36.0)
MCV: 92.8 fL (ref 80.0–100.0)
Monocytes Absolute: 0.5 10*3/uL (ref 0.1–1.0)
Monocytes Relative: 10 %
Neutro Abs: 2.5 10*3/uL (ref 1.7–7.7)
Neutrophils Relative %: 53 %
Platelets: 238 10*3/uL (ref 150–400)
RBC: 4.18 MIL/uL (ref 3.87–5.11)
RDW: 13.2 % (ref 11.5–15.5)
WBC: 4.7 10*3/uL (ref 4.0–10.5)
nRBC: 0 % (ref 0.0–0.2)

## 2020-08-01 LAB — MAGNESIUM: Magnesium: 2.1 mg/dL (ref 1.7–2.4)

## 2020-08-01 LAB — C-REACTIVE PROTEIN: CRP: 1.2 mg/dL — ABNORMAL HIGH (ref <1.0)

## 2020-08-01 LAB — PHOSPHORUS: Phosphorus: 2.5 mg/dL (ref 2.5–4.6)

## 2020-08-01 LAB — GLUCOSE, CAPILLARY
Glucose-Capillary: 295 mg/dL — ABNORMAL HIGH (ref 70–99)
Glucose-Capillary: 283 mg/dL — ABNORMAL HIGH (ref 70–99)
Glucose-Capillary: 186 mg/dL — ABNORMAL HIGH (ref 70–99)
Glucose-Capillary: 117 mg/dL — ABNORMAL HIGH (ref 70–99)

## 2020-08-01 LAB — HEMOGLOBIN A1C
Hgb A1c MFr Bld: 8.7 % — ABNORMAL HIGH (ref 4.8–5.6)
Mean Plasma Glucose: 203 mg/dL

## 2020-08-01 LAB — D-DIMER, QUANTITATIVE: D-Dimer, Quant: 0.99 ug/mL-FEU — ABNORMAL HIGH (ref 0.00–0.50)

## 2020-08-01 MED ORDER — INSULIN ASPART 100 UNIT/ML ~~LOC~~ SOLN
4.0000 [IU] | Freq: Three times a day (TID) | SUBCUTANEOUS | Status: DC
  Administered 2020-08-01 – 2020-08-05 (×12): 4 [IU] via SUBCUTANEOUS

## 2020-08-01 MED ORDER — INSULIN GLARGINE 100 UNIT/ML ~~LOC~~ SOLN
20.0000 [IU] | Freq: Every day | SUBCUTANEOUS | Status: DC
  Administered 2020-08-02 – 2020-08-05 (×4): 20 [IU] via SUBCUTANEOUS
  Filled 2020-08-01 (×5): qty 0.2

## 2020-08-01 MED ORDER — BARICITINIB 2 MG PO TABS
2.0000 mg | ORAL_TABLET | ORAL | Status: DC
  Administered 2020-08-01 – 2020-08-05 (×5): 2 mg via ORAL
  Filled 2020-08-01 (×5): qty 1

## 2020-08-01 MED ORDER — INSULIN GLARGINE 100 UNIT/ML ~~LOC~~ SOLN
10.0000 [IU] | Freq: Once | SUBCUTANEOUS | Status: AC
  Administered 2020-08-01: 10 [IU] via SUBCUTANEOUS
  Filled 2020-08-01: qty 0.1

## 2020-08-01 NOTE — Progress Notes (Signed)
Inpatient Diabetes Program Recommendations  AACE/ADA: New Consensus Statement on Inpatient Glycemic Control (2015)  Target Ranges:  Prepandial:   less than 140 mg/dL      Peak postprandial:   less than 180 mg/dL (1-2 hours)      Critically ill patients:  140 - 180 mg/dL   Lab Results  Component Value Date   GLUCAP 283 (H) 08/01/2020   HGBA1C 8.7 (H) 07/30/2020    Review of Glycemic Control Results for [name], [name] (MRN [id number]) as of 08/01/2020 12:58  Ref. Range 07/31/2020 16:43 07/31/2020 20:23 08/01/2020 07:39 08/01/2020 11:36  Glucose-Capillary Latest Ref Range: 70 - 99 mg/dL 169 (H) 220 (H) 295 (H) 283 (H)   Diabetes history: Type 2 DM Outpatient Diabetes medications: Amaryl 4 mg QD, Metformin 500 mg BID Current orders for Inpatient glycemic control: Lantus 10 units QD, Novolog 0-20 units TID, Novolog 0-5 units QHS Solumedrol 80 mg BID Inpatient Diabetes Program Recommendations:    Consider increasing Lantus to 10 units BID and adding Tradjenta 5 mg QD. While in the setting of steroids, would also consider adding Novolog 4 units TID (Assuming patient is consuming >50% of meals).   Thanks, Bronson Curb, MSN, RNC-OB Diabetes Coordinator [phone] (8a-5p)

## 2020-08-01 NOTE — Progress Notes (Signed)
PROGRESS NOTE  [name] YQI:[id number] DOB: 1946/02/08 DOA: 07/30/2020 PCP: Redmond School, MD  Brief History:  74 year old female with a history of diabetes mellitus type 2, hypertension, hypothyroidism, diverticulosis presenting with generalized weakness and confusion.  Daughter noted patient was more somnolent on 10/18 and worsened on 10/19.  Apparently, the patient was slow to respond and barely mumbling words at home.  As result, the patient was brought to the emergency department for further evaluation.  At the time of my evaluation, the patient was awake and alert and answering simple questions.  She had denied any fevers, chills, chest pain, shortness breath, coughing, hemoptysis, diarrhea, abdominal pain, dysuria, hematuria.  She did have one episode of nausea/vomiting prior to coming to the emergency department.  She complained of generalized weakness with difficulty getting out of bed. She states that she is not vaccinated against COVID-19.  She stated that her daughter with whom she lives was diagnosed with Covid approximately 2-1/2 weeks prior to this admission. In the emergency department, the patient was afebrile hemodynamically stable.  Oxygen saturation was 85% on room air.  COVID-19 PCR RT-PCR was positive.  The patient was started on remdesivir and IV steroids.  Assessment/Plan: Acute respiratory failure with hypoxia due to COVID-19 pneumonia -Oxygen demand 15L>>10LHFNC -Continue remdesivir -Continue IV Solu-Medrol -Vitamin C and zinc -Incentive spirometry -Ferritin 1116>> 785>>670 -CRP 5.3>> 4.1>>1.2 -D-dimer 1.67>> 1.07>>0.99 -PCT 0.15 -Personally reviewed chest x-ray--bilateral patchy interstitial opacities, right greater than left  Acute metabolic encephalopathy -Secondary to hypoxia and COVID-19 pneumonia -Mental status gradually improving, but not at baseline -TSH 1.947 -Personally reviewed EKG--sinus rhythm, nonspecific T wave  change  Uncontrolled diabetes mellitus type 2 with hyperglycemia -NovoLog sliding scale -10/19--Hemoglobin A1c--8.7 -Increase Lantus 20 units daily -Holding Metformin, glimepiride, alogliptin -start linagliptin -add novolog  4 units with meals  Essential hypertension -Holding losartan, HCTZ due to hyponatremia and volume depletion  Hyperlipidemia -Restart simvastatin when LFTs are stable/improved  Hypokalemia -Repleted -Magnesium 2.1  Hyponatremia -Secondary to poor solute intake  Depression/anxiety -Restart Lexapro     Status is: Inpatient  Remains inpatient appropriate because:Inpatient level of care appropriate due to severity of illness   Dispo: The patient is from: Home  Anticipated d/c is to: Home--refuses SNF  Anticipated d/c date is: 2 days  Patient currently is not medically stable to d/c.        Family Communication: updated daughter 08/01/20  Consultants:  none  Code Status:  FULL  DVT Prophylaxis: [place] Lovenox   Procedures: As Listed in Progress Note Above  Antibiotics: None   Total time spent 35 minutes.  Greater than 50% spent face to face counseling and coordinating care.      Subjective: Patient remains sob, but states she is breathing better than yesterday.  Has dry cough, no hemoptysis.  Denies f/c, cp, n/v/d, abd pain, dysuria  Objective: Vitals:   07/31/20 2020 08/01/20 0057 08/01/20 0611 08/01/20 1421  BP: (!) 150/63 128/62 (!) 148/53 (!) 126/55  Pulse: 62 (!) 59 (!) 59 67  Resp: 20 18 17 20   Temp: (!) 96.2 F (35.7 C)     TempSrc: Axillary     SpO2: 93% 96% 98% 95%  Weight:      Height:        Intake/Output Summary (Last 24 hours) at 08/01/2020 1533 Last data filed at 08/01/2020 0300 Gross per 24 hour  Intake 326.58 ml  Output --  Net 326.58 ml  Weight change: 0 kg Exam:   General:  Pt is alert, follows commands appropriately, not in  acute distress  HEENT: No icterus, No thrush, No neck mass, [place]/AT  Cardiovascular: RRR, S1/S2, no rubs, no gallops  Respiratory: bibasilar rales. No wheeze  Abdomen: Soft/+BS, non tender, non distended, no guarding  Extremities: No edema, No lymphangitis, No petechiae, No rashes, no synovitis   Data Reviewed: I have personally reviewed following labs and imaging studies Basic Metabolic Panel: Recent Labs  Lab 07/30/20 1542 07/31/20 0535 08/01/20 0651  NA 130* 132* 133*  K 3.3* 3.7 4.9  CL 88* 93* 97*  CO2 28 27 23   GLUCOSE 307* 314* 240*  BUN 18 18 15   CREATININE 0.80 0.73 0.60  CALCIUM 8.4* 8.1* 8.2*  MG  --  2.1 2.1  PHOS  --  2.9 2.5   Liver Function Tests: Recent Labs  Lab 07/30/20 1542 07/31/20 0535 08/01/20 0651  AST 68* 46* 48*  ALT 45* 33 31  ALKPHOS 40 32* 33*  BILITOT 0.8 0.8 0.7  PROT 6.8 5.6* 5.3*  ALBUMIN 3.4* 2.7* 2.7*   No results for input(s): LIPASE, AMYLASE in the last 168 hours. No results for input(s): AMMONIA in the last 168 hours. Coagulation Profile: Recent Labs  Lab 07/30/20 1542  INR 1.1   CBC: Recent Labs  Lab 07/30/20 1542 07/31/20 0535 08/01/20 0751  WBC 6.7 2.8* 4.7  NEUTROABS 5.9 1.9 2.5  HGB 12.9 11.5* 12.7  HCT 38.2 34.6* 38.8  MCV 89.0 89.4 92.8  PLT 277 250 238   Cardiac Enzymes: No results for input(s): CKTOTAL, CKMB, CKMBINDEX, TROPONINI in the last 168 hours. BNP: Invalid input(s): POCBNP CBG: Recent Labs  Lab 07/31/20 1149 07/31/20 1643 07/31/20 2023 08/01/20 0739 08/01/20 1136  GLUCAP 244* 169* 220* 295* 283*   HbA1C: Recent Labs    07/30/20 2000  HGBA1C 8.7*   Urine analysis: No results found for: COLORURINE, APPEARANCEUR, LABSPEC, PHURINE, GLUCOSEU, HGBUR, BILIRUBINUR, KETONESUR, PROTEINUR, UROBILINOGEN, NITRITE, LEUKOCYTESUR Sepsis Labs: @LABRCNTIP (procalcitonin:4,lacticidven:4) ) Recent Results (from the past 240 hour(s))  Respiratory Panel by RT PCR (Flu A&B, Covid) - Nasopharyngeal  Swab     Status: Abnormal   Collection Time: 07/30/20  6:03 PM   Specimen: Nasopharyngeal Swab  Result Value Ref Range Status   SARS Coronavirus 2 by RT PCR POSITIVE (A) NEGATIVE Final    Comment: RESULT CALLED TO, READ BACK BY AND VERIFIED WITH: T EASTER,RN@[id number]  07/30/20 MKELLY (NOTE) SARS-CoV-2 target nucleic acids are DETECTED.  SARS-CoV-2 RNA is generally detectable in upper respiratory specimens  during the acute phase of infection. Positive results are indicative of the presence of the identified virus, but do not rule out bacterial infection or co-infection with other pathogens not detected by the test. Clinical correlation with patient history and other diagnostic information is necessary to determine patient infection status. The expected result is Negative.  Fact Sheet for Patients:  PinkCheek.be  Fact Sheet for Healthcare Providers: GravelBags.it  This test is not yet approved or cleared by the [place] FDA and  has been authorized for detection and/or diagnosis of SARS-CoV-2 by FDA under an Emergency Use Authorization (EUA).  This EUA will remain in effect (meaning this test can be [place] ed) for the duration of  the COVID-19 declaration under Section 564(b)(1) of the Act, 21 U.S.C. section 360bbb-3(b)(1), unless the authorization is terminated or revoked sooner.      Influenza A by PCR NEGATIVE NEGATIVE Final   Influenza B by PCR NEGATIVE  NEGATIVE Final    Comment: (NOTE) The Xpert Xpress SARS-CoV-2/FLU/RSV assay is intended as an aid in  the diagnosis of influenza from Nasopharyngeal swab specimens and  should not be used as a sole basis for treatment. Nasal washings and  aspirates are unacceptable for Xpert Xpress SARS-CoV-2/FLU/RSV  testing.  Fact Sheet for Patients: PinkCheek.be  Fact Sheet for Healthcare Providers: GravelBags.it  This  test is not yet approved or cleared by the [place] FDA and  has been authorized for detection and/or diagnosis of SARS-CoV-2 by  FDA under an Emergency Use Authorization (EUA). This EUA will remain  in effect (meaning this test can be used) for the duration of the  Covid-19 declaration under Section 564(b)(1) of the Act, 21  U.S.C. section 360bbb-3(b)(1), unless the authorization is  terminated or revoked. Performed at [facility], [address] [place], [place] [address]   Blood Culture (routine x 2)     Status: None (Preliminary result)   Collection Time: 07/30/20  8:00 PM   Specimen: Left Antecubital; Blood  Result Value Ref Range Status   Specimen Description LEFT ANTECUBITAL  Final   Special Requests   Final    BOTTLES DRAWN AEROBIC AND ANAEROBIC Blood Culture adequate volume   Culture   Final    NO GROWTH 2 DAYS Performed at [facility], [address] [place], [place] [address]    Report Status PENDING  Incomplete  Blood Culture (routine x 2)     Status: None (Preliminary result)   Collection Time: 07/30/20  8:23 PM   Specimen: Left Antecubital; Blood  Result Value Ref Range Status   Specimen Description LEFT ANTECUBITAL  Final   Special Requests   Final    BOTTLES DRAWN AEROBIC AND ANAEROBIC Blood Culture adequate volume   Culture   Final    NO GROWTH 2 DAYS Performed at [facility], [address] [place], [place] [address]    Report Status PENDING  Incomplete     Scheduled Meds: . vitamin C  500 mg Oral Daily  . baricitinib  2 mg Oral Q24H  . enoxaparin (LOVENOX) injection  40 mg Subcutaneous Q24H  . insulin aspart  0-20 Units Subcutaneous TID WC  . insulin aspart  0-5 Units Subcutaneous QHS  . [START ON 08/02/2020] insulin glargine  20 Units Subcutaneous Daily  . methylPREDNISolone (SOLU-MEDROL) injection  80 mg Intravenous Q12H  . zinc sulfate  220 mg Oral Daily   Continuous Infusions: . 0.9 % NaCl with KCl 40 mEq / L 75 mL/hr at 08/01/20 0615  .  remdesivir 100 mg in NS 100 mL 100 mg (08/01/20 1000)    Procedures/Studies: DG Chest 2 View  Result Date: 07/30/2020 CLINICAL DATA:  Hypoxemia EXAM: CHEST - 2 VIEW COMPARISON:  2009 FINDINGS: There is interstitial prominence with patchy ill-defined density bilaterally at the lung bases primarily. No large pleural effusion. No pneumothorax. Cardiomediastinal contours are within normal limits. No acute osseous abnormality. Surgical clips overlie the right chest wall. IMPRESSION: Interstitial prominence and patchy ill-defined density at the lung bases, which may reflect edema or atypical/viral pneumonia. Electronically Signed   By: Macy Mis M.D.   On: 07/30/2020 16:23   CT HEAD WO CONTRAST  Result Date: 07/30/2020 CLINICAL DATA:  Altered mental status EXAM: CT HEAD WITHOUT CONTRAST TECHNIQUE: Contiguous axial images were obtained from the base of the skull through the vertex without intravenous contrast. COMPARISON:  None. FINDINGS: Brain: Normal anatomic configuration. Parenchymal volume loss is commensurate with  the patient's age. Mild periventricular white matter changes are present likely reflecting the sequela of small vessel ischemia. No abnormal intra or extra-axial mass lesion or fluid collection. No abnormal mass effect or midline shift. No evidence of acute intracranial hemorrhage or infarct. Ventricular size is normal. Cerebellum unremarkable. Vascular: No asymmetric hyperdense vasculature at the skull base. Skull: Intact Sinuses/Orbits: Paranasal sinuses are clear. Orbits are unremarkable. Other: Mastoid air cells and middle ear cavities are clear. IMPRESSION: Mild senescent change. No evidence of acute intracranial hemorrhage or infarct. Electronically Signed   By: Fidela Salisbury MD   On: 07/30/2020 16:35   MR ANGIO HEAD WO CONTRAST  Result Date: 07/31/2020 CLINICAL DATA:  Neuro deficit, acute, stroke suspected; right lower extremity weakness, COVID pneumonia. EXAM: MRI HEAD WITHOUT  CONTRAST MRA HEAD WITHOUT CONTRAST TECHNIQUE: Multiplanar, multiecho pulse sequences of the brain and surrounding structures were obtained without intravenous contrast. Angiographic images of the head were obtained using MRA technique without contrast. COMPARISON:  Noncontrast head CT 07/30/2020. FINDINGS: MRI HEAD FINDINGS Brain: Mild generalized cerebral atrophy with associated prominence of ventricles and sulci. No significant white matter disease for age. There is no acute infarct. No evidence of intracranial mass. No chronic intracranial blood products. No extra-axial fluid collection. No midline shift. Vascular: Reported below. Skull and upper cervical spine: No focal marrow lesion. Sinuses/Orbits: Visualized orbits show no acute finding. Minimal ethmoid sinus mucosal thickening. Trace fluid within the bilateral mastoid air cells. MRA HEAD FINDINGS The intracranial internal carotid arteries are patent. The M1 middle cerebral arteries are patent without significant stenosis. No M2 proximal branch occlusion. Atherosclerotic irregularity of M2 and more distal MCA branch vessels bilaterally. Most notably, there is a moderate focal stenosis within a proximal superior division left M2 MCA branch vessel. Apparent high-grade stenosis within a mid to distal M2 right MCA branch vessel. The anterior cerebral arteries are patent. The intracranial vertebral arteries are patent. The basilar artery is patent. The posterior cerebral arteries are patent. Atherosclerotic irregularity of the P2 and P3 posterior cerebral arteries bilaterally with sites of apparent high-grade stenosis. Posterior communicating arteries are hypoplastic or absent bilaterally. No intracranial aneurysm is identified. IMPRESSION: MRI brain: 1. No evidence of acute intracranial abnormality, including acute infarction. 2. Mild generalized cerebral atrophy. 3. Minimal ethmoid sinus mucosal thickening. 4. Trace bilateral mastoid effusions. MRA head: 1. No  intracranial large vessel occlusion. 2. Intracranial atherosclerotic disease with multifocal stenoses, most notably as follows. 3. Moderate focal stenosis within a proximal superior division left M2 MCA branch vessel. 4. Apparent high-grade stenosis within a mid to distal M2 right MCA branch vessel. 5. Sites of high-grade stenosis within the P2 and P3 posterior cerebral arteries bilaterally. Electronically Signed   By: Kellie Simmering DO   On: 07/31/2020 08:04   MR BRAIN WO CONTRAST  Result Date: 07/31/2020 CLINICAL DATA:  Neuro deficit, acute, stroke suspected; right lower extremity weakness, COVID pneumonia. EXAM: MRI HEAD WITHOUT CONTRAST MRA HEAD WITHOUT CONTRAST TECHNIQUE: Multiplanar, multiecho pulse sequences of the brain and surrounding structures were obtained without intravenous contrast. Angiographic images of the head were obtained using MRA technique without contrast. COMPARISON:  Noncontrast head CT 07/30/2020. FINDINGS: MRI HEAD FINDINGS Brain: Mild generalized cerebral atrophy with associated prominence of ventricles and sulci. No significant white matter disease for age. There is no acute infarct. No evidence of intracranial mass. No chronic intracranial blood products. No extra-axial fluid collection. No midline shift. Vascular: Reported below. Skull and upper cervical spine: No focal marrow lesion. Sinuses/Orbits:  Visualized orbits show no acute finding. Minimal ethmoid sinus mucosal thickening. Trace fluid within the bilateral mastoid air cells. MRA HEAD FINDINGS The intracranial internal carotid arteries are patent. The M1 middle cerebral arteries are patent without significant stenosis. No M2 proximal branch occlusion. Atherosclerotic irregularity of M2 and more distal MCA branch vessels bilaterally. Most notably, there is a moderate focal stenosis within a proximal superior division left M2 MCA branch vessel. Apparent high-grade stenosis within a mid to distal M2 right MCA branch vessel. The  anterior cerebral arteries are patent. The intracranial vertebral arteries are patent. The basilar artery is patent. The posterior cerebral arteries are patent. Atherosclerotic irregularity of the P2 and P3 posterior cerebral arteries bilaterally with sites of apparent high-grade stenosis. Posterior communicating arteries are hypoplastic or absent bilaterally. No intracranial aneurysm is identified. IMPRESSION: MRI brain: 1. No evidence of acute intracranial abnormality, including acute infarction. 2. Mild generalized cerebral atrophy. 3. Minimal ethmoid sinus mucosal thickening. 4. Trace bilateral mastoid effusions. MRA head: 1. No intracranial large vessel occlusion. 2. Intracranial atherosclerotic disease with multifocal stenoses, most notably as follows. 3. Moderate focal stenosis within a proximal superior division left M2 MCA branch vessel. 4. Apparent high-grade stenosis within a mid to distal M2 right MCA branch vessel. 5. Sites of high-grade stenosis within the P2 and P3 posterior cerebral arteries bilaterally. Electronically Signed   By: Kellie Simmering DO   On: 07/31/2020 08:04   US Carotid Bilateral  Result Date: 07/31/2020 CLINICAL DATA:  74 year old female with stroke EXAM: BILATERAL CAROTID DUPLEX ULTRASOUND TECHNIQUE: Pearline Cables scale imaging, color Doppler and duplex ultrasound were performed of bilateral carotid and vertebral arteries in the neck. COMPARISON:  None. FINDINGS: Criteria: Quantification of carotid stenosis is based on velocity parameters that correlate the residual internal carotid diameter with NASCET-based stenosis levels, using the diameter of the distal internal carotid lumen as the denominator for stenosis measurement. The following velocity measurements were obtained: RIGHT ICA:  Systolic 68 cm/sec, Diastolic 14 cm/sec CCA:  66 cm/sec SYSTOLIC ICA/CCA RATIO:  1.0 ECA:  141 cm/sec LEFT ICA:  Systolic 69 cm/sec, Diastolic 14 cm/sec CCA:  77 cm/sec SYSTOLIC ICA/CCA RATIO:  0.9 ECA:  73  cm/sec Right Brachial SBP: Not acquired Left Brachial SBP: Not acquired RIGHT CAROTID ARTERY: No significant calcifications of the right common carotid artery. Intermediate waveform maintained. Heterogeneous and partially calcified plaque at the right carotid bifurcation. No significant lumen shadowing. Low resistance waveform of the right ICA. No significant tortuosity. RIGHT VERTEBRAL ARTERY: Antegrade flow with low resistance waveform. LEFT CAROTID ARTERY: No significant calcifications of the left common carotid artery. Intermediate waveform maintained. Heterogeneous and partially calcified plaque at the left carotid bifurcation without significant lumen shadowing. Low resistance waveform of the left ICA. No significant tortuosity. LEFT VERTEBRAL ARTERY:  Antegrade flow with low resistance waveform. 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 Incidental right carotid body tumor, 2.3 cm Signed, Jaime S. Dellia Nims, RPVI [facility] Electronically Signed   By: Corrie Mckusick D.O.   On: 07/31/2020 14:35   DG Foot Complete Right  Result Date: 07/16/2020 Please see detailed radiograph report in office note.  ECHOCARDIOGRAM COMPLETE  Result Date: 07/31/2020    ECHOCARDIOGRAM REPORT   Patient Name:   YOMAIRA SOLAR Date of Exam: 07/31/2020 Medical Rec #:  [id number]  Height:       60.0 in Accession #:    [id number]  Weight:       149.0 lb Date of Birth:  03/08/1946   BSA:          1.647 m Patient Age:    56 years    BP:           148/55 mmHg Patient Gender: F           HR:           58 bpm. Exam Location:  [facility] Procedure: 2D Echo Indications:    Stroke 434.91 / I163.9  History:        Patient has prior history of Echocardiogram examinations,  most                 recent 03/05/2009. Risk Factors:Diabetes, Hypertension and                 Non-Smoker.  Sonographer:    Leavy Cella RDCS (AE) Referring Phys: [place]  1. Left ventricular ejection fraction, by estimation, is 55 to 60%. The left ventricle has normal function. The left ventricle has no regional wall motion abnormalities. There is mild left ventricular hypertrophy. Left ventricular diastolic parameters are indeterminate.  2. Right ventricular systolic function is normal. The right ventricular size is normal. Tricuspid regurgitation signal is inadequate for assessing PA pressure.  3. The mitral valve is grossly normal. Mild mitral valve regurgitation.  4. The aortic valve is tricuspid. Aortic valve regurgitation is not visualized.  5. The inferior vena cava is normal in size with greater than 50% respiratory variability, suggesting right atrial pressure of 3 mmHg. FINDINGS  Left Ventricle: Left ventricular ejection fraction, by estimation, is 55 to 60%. The left ventricle has normal function. The left ventricle has no regional wall motion abnormalities. The left ventricular internal cavity size was normal in size. There is  mild left ventricular hypertrophy. Left ventricular diastolic parameters are indeterminate. Right Ventricle: The right ventricular size is normal. No increase in right ventricular wall thickness. Right ventricular systolic function is normal. Tricuspid regurgitation signal is inadequate for assessing PA pressure. Left Atrium: Left atrial size was normal in size. Right Atrium: Right atrial size was normal in size. Pericardium: There is no evidence of pericardial effusion. Mitral Valve: The mitral valve is grossly normal. Mild mitral valve regurgitation. Tricuspid Valve: The tricuspid valve is grossly normal. Tricuspid valve regurgitation is trivial. Aortic Valve: The aortic valve is tricuspid. There is mild aortic valve annular calcification.  Aortic valve regurgitation is not visualized. Pulmonic Valve: The pulmonic valve was grossly normal. Pulmonic valve regurgitation is trivial. Aorta: The aortic root is normal in size and structure. Venous: The inferior vena cava is normal in size with greater than 50% respiratory variability, suggesting right atrial pressure of 3 mmHg. IAS/Shunts: No atrial level shunt detected by color flow Doppler.  LEFT VENTRICLE PLAX 2D LVIDd:         5.02 cm  Diastology LVIDs:         2.84 cm  LV e' medial:    6.20 cm/s LV PW:         1.55 cm  LV E/e' medial:  11.0 LV IVS:        1.21 cm  LV e' lateral:   7.07 cm/s LVOT diam:     1.90 cm  LV E/e' lateral: 9.6 LVOT Area:     2.84 cm  RIGHT VENTRICLE  RV S prime:     13.70 cm/s TAPSE (M-mode): 2.6 cm LEFT ATRIUM             Index       RIGHT ATRIUM           Index LA diam:        3.50 cm 2.12 cm/m  RA Area:     13.90 cm LA Vol (A2C):   48.2 ml 29.26 ml/m RA Volume:   34.00 ml  20.64 ml/m LA Vol (A4C):   31.6 ml 19.18 ml/m LA Biplane Vol: 39.1 ml 23.74 ml/m   AORTA Ao Root diam: 2.70 cm MITRAL VALVE MV Area (PHT): 3.00 cm    SHUNTS MV Decel Time: 253 msec    Systemic Diam: 1.90 cm MV E velocity: 68.10 cm/s MV A velocity: 64.70 cm/s MV E/A ratio:  1.05 Rozann Lesches MD Electronically signed by Rozann Lesches MD Signature Date/Time: 07/31/2020/12:00:49 PM    Final     Orson Eva, DO  [facility]  If 7PM-7AM, please contact night-coverage [email or web address] Password [email or web address] 08/01/2020, 3:33 PM   LOS: 2 days

## 2020-08-02 ENCOUNTER — Inpatient Hospital Stay: Payer: Self-pay

## 2020-08-02 DIAGNOSIS — E1165 Type 2 diabetes mellitus with hyperglycemia: Secondary | ICD-10-CM | POA: Diagnosis not present

## 2020-08-02 DIAGNOSIS — U071 COVID-19: Secondary | ICD-10-CM | POA: Diagnosis not present

## 2020-08-02 DIAGNOSIS — G9341 Metabolic encephalopathy: Secondary | ICD-10-CM | POA: Diagnosis not present

## 2020-08-02 DIAGNOSIS — J9601 Acute respiratory failure with hypoxia: Secondary | ICD-10-CM | POA: Diagnosis not present

## 2020-08-02 LAB — GLUCOSE, CAPILLARY
Glucose-Capillary: 209 mg/dL — ABNORMAL HIGH (ref 70–99)
Glucose-Capillary: 268 mg/dL — ABNORMAL HIGH (ref 70–99)
Glucose-Capillary: 116 mg/dL — ABNORMAL HIGH (ref 70–99)
Glucose-Capillary: 107 mg/dL — ABNORMAL HIGH (ref 70–99)

## 2020-08-02 MED ORDER — LINAGLIPTIN 5 MG PO TABS
5.0000 mg | ORAL_TABLET | Freq: Every day | ORAL | Status: DC
  Administered 2020-08-02 – 2020-08-05 (×4): 5 mg via ORAL
  Filled 2020-08-02 (×4): qty 1

## 2020-08-02 NOTE — Progress Notes (Signed)
PROGRESS NOTE  [name]:[id number] DOB: July 05, 1946 DOA: 07/30/2020 PCP: Redmond School, MD  Brief History: 74 year old female with a history of diabetes mellitus type 2, hypertension, hypothyroidism, diverticulosis presenting with generalized weakness and confusion.Daughter noted patient was more somnolent on 10/18 and worsened on 10/19.Apparently, the patient was slow to respond and barely mumbling words at home. As result, the patient was brought to the emergency department for further evaluation. At the time of my evaluation, the patient was awake and alert and answering simple questions. She had denied any fevers, chills, chest pain, shortness breath, coughing, hemoptysis, diarrhea, abdominal pain, dysuria, hematuria. She did have one episode of nausea/vomiting prior to coming to the emergency department. She complained of generalized weakness with difficulty getting out of bed. She states that she is not vaccinated against COVID-19. She stated that her daughter with whom she lives was diagnosed with Covid approximately 2-1/2 weeks prior to this admission. In the emergency department, the patient was afebrile hemodynamically stable. Oxygen saturation was 85% on room air. COVID-19 PCR RT-PCR was positive. The patient was started on remdesivir and IV steroids.  Assessment/Plan: Acute respiratory failure with hypoxia due to COVID-19 pneumonia -Oxygen demand 15L>>10LHFNC -Continue remdesivir -Continue IV Solu-Medrol -Vitamin C and zinc -Incentive spirometry -Ferritin 1116>>785>>670 -CRP 5.3>>4.1>>1.2 -D-dimer 1.67>>1.07>>0.99 -PCT 0.15 -Personally reviewed chest x-ray--bilateral patchy interstitial opacities, right greater than left  Acute metabolic encephalopathy -Secondary to hypoxia and COVID-19 pneumonia -Mental status gradually improving, but not at baseline -TSH 1.947 -Personally reviewed EKG--sinus rhythm, nonspecific T wave  change  Uncontrolled diabetes mellitus type 2 with hyperglycemia -NovoLog sliding scale -10/19--Hemoglobin A1c--8.7 -Increase Lantus 20 units daily -Holding Metformin, glimepiride, alogliptin -start linagliptin -add novolog 4 units with meals  Essential hypertension -Holding losartan, HCTZ due to hyponatremia and volume depletion  Hyperlipidemia -Restart simvastatin when LFTs are stable/improved  Hypokalemia -Repleted -Magnesium 2.1  Hyponatremia -Secondary to poor solute intake  Depression/anxiety -Restart Lexapro     Status is: Inpatient  Remains inpatient appropriate because:Inpatient level of care appropriate due to severity of illness   Dispo: The patient is from:Home Anticipated d/c is [facility] Anticipated d/c date is: 2 days Patient currently is not medically stable to d/c.        Family Communication:updated daughter 08/02/20  Consultants:none  Code Status: FULL  DVT Prophylaxis: [place] Lovenox   Procedures: As Listed in Progress Note Above  Antibiotics: None   Total time spent 35 minutes.  Greater than 50% spent face to face counseling and coordinating care.     Subjective: Patient is breathing better, but continues to have sob with mild exertion.  Denies f/c, cp, n/v/d, abd pain  Objective: Vitals:   08/02/20 0215 08/02/20 0230 08/02/20 0540 08/02/20 1407  BP:   (!) 160/83 (!) 156/73  Pulse:   66 70  Resp:   20 20  Temp:   97.9 F (36.6 C) 98 F (36.7 C)  TempSrc:   Oral Oral  SpO2: (!) 86% 93% 98% 100%  Weight:      Height:        Intake/Output Summary (Last 24 hours) at 08/02/2020 1622 Last data filed at 08/02/2020 0500 Gross per 24 hour  Intake --  Output 1800 ml  Net -1800 ml   Weight change:  Exam:   General:  Pt is alert, follows commands appropriately, not in acute distress  HEENT: No icterus, No thrush, No neck mass,  [place]/AT  Cardiovascular: RRR, S1/S2,  no rubs, no gallops  Respiratory:bibasilar crackles. No wheeze  Abdomen: Soft/+BS, non tender, non distended, no guarding  Extremities: No edema, No lymphangitis, No petechiae, No rashes, no synovitis   Data Reviewed: I have personally reviewed following labs and imaging studies Basic Metabolic Panel: Recent Labs  Lab 07/30/20 1542 07/31/20 0535 08/01/20 0651  NA 130* 132* 133*  K 3.3* 3.7 4.9  CL 88* 93* 97*  CO2 28 27 23   GLUCOSE 307* 314* 240*  BUN 18 18 15   CREATININE 0.80 0.73 0.60  CALCIUM 8.4* 8.1* 8.2*  MG  --  2.1 2.1  PHOS  --  2.9 2.5   Liver Function Tests: Recent Labs  Lab 07/30/20 1542 07/31/20 0535 08/01/20 0651  AST 68* 46* 48*  ALT 45* 33 31  ALKPHOS 40 32* 33*  BILITOT 0.8 0.8 0.7  PROT 6.8 5.6* 5.3*  ALBUMIN 3.4* 2.7* 2.7*   No results for input(s): LIPASE, AMYLASE in the last 168 hours. No results for input(s): AMMONIA in the last 168 hours. Coagulation Profile: Recent Labs  Lab 07/30/20 1542  INR 1.1   CBC: Recent Labs  Lab 07/30/20 1542 07/31/20 0535 08/01/20 0751  WBC 6.7 2.8* 4.7  NEUTROABS 5.9 1.9 2.5  HGB 12.9 11.5* 12.7  HCT 38.2 34.6* 38.8  MCV 89.0 89.4 92.8  PLT 277 250 238   Cardiac Enzymes: No results for input(s): CKTOTAL, CKMB, CKMBINDEX, TROPONINI in the last 168 hours. BNP: Invalid input(s): POCBNP CBG: Recent Labs  Lab 08/01/20 1136 08/01/20 1610 08/01/20 2108 08/02/20 0745 08/02/20 1157  GLUCAP 283* 186* 117* 209* 268*   HbA1C: Recent Labs    07/30/20 2000  HGBA1C 8.7*   Urine analysis: No results found for: COLORURINE, APPEARANCEUR, LABSPEC, PHURINE, GLUCOSEU, HGBUR, BILIRUBINUR, KETONESUR, PROTEINUR, UROBILINOGEN, NITRITE, LEUKOCYTESUR Sepsis Labs: @LABRCNTIP (procalcitonin:4,lacticidven:4) ) Recent Results (from the past 240 hour(s))  Respiratory Panel by RT PCR (Flu A&B, Covid) - Nasopharyngeal Swab     Status: Abnormal   Collection Time: 07/30/20   6:03 PM   Specimen: Nasopharyngeal Swab  Result Value Ref Range Status   SARS Coronavirus 2 by RT PCR POSITIVE (A) NEGATIVE Final    Comment: RESULT CALLED TO, READ BACK BY AND VERIFIED WITH: T EASTER,RN@[id number]  07/30/20 MKELLY (NOTE) SARS-CoV-2 target nucleic acids are DETECTED.  SARS-CoV-2 RNA is generally detectable in upper respiratory specimens  during the acute phase of infection. Positive results are indicative of the presence of the identified virus, but do not rule out bacterial infection or co-infection with other pathogens not detected by the test. Clinical correlation with patient history and other diagnostic information is necessary to determine patient infection status. The expected result is Negative.  Fact Sheet for Patients:  PinkCheek.be  Fact Sheet for Healthcare Providers: GravelBags.it  This test is not yet approved or cleared by the [place] FDA and  has been authorized for detection and/or diagnosis of SARS-CoV-2 by FDA under an Emergency Use Authorization (EUA).  This EUA will remain in effect (meaning this test can be [place] ed) for the duration of  the COVID-19 declaration under Section 564(b)(1) of the Act, 21 U.S.C. section 360bbb-3(b)(1), unless the authorization is terminated or revoked sooner.      Influenza A by PCR NEGATIVE NEGATIVE Final   Influenza B by PCR NEGATIVE NEGATIVE Final    Comment: (NOTE) The Xpert Xpress SARS-CoV-2/FLU/RSV assay is intended as an aid in  the diagnosis of influenza from Nasopharyngeal swab specimens and  should not be used as a  sole basis for treatment. Nasal washings and  aspirates are unacceptable for Xpert Xpress SARS-CoV-2/FLU/RSV  testing.  Fact Sheet for Patients: PinkCheek.be  Fact Sheet for Healthcare Providers: GravelBags.it  This test is not yet approved or cleared by the [place]  FDA and  has been authorized for detection and/or diagnosis of SARS-CoV-2 by  FDA under an Emergency Use Authorization (EUA). This EUA will remain  in effect (meaning this test can be used) for the duration of the  Covid-19 declaration under Section 564(b)(1) of the Act, 21  U.S.C. section 360bbb-3(b)(1), unless the authorization is  terminated or revoked. Performed at [facility], [address], [place], [place] [address]   Blood Culture (routine x 2)     Status: None (Preliminary result)   Collection Time: 07/30/20  8:00 PM   Specimen: Left Antecubital; Blood  Result Value Ref Range Status   Specimen Description LEFT ANTECUBITAL  Final   Special Requests   Final    BOTTLES DRAWN AEROBIC AND ANAEROBIC Blood Culture adequate volume   Culture   Final    NO GROWTH 3 DAYS Performed at [facility], [address] [place], [place] [address]    Report Status PENDING  Incomplete  Blood Culture (routine x 2)     Status: None (Preliminary result)   Collection Time: 07/30/20  8:23 PM   Specimen: Left Antecubital; Blood  Result Value Ref Range Status   Specimen Description LEFT ANTECUBITAL  Final   Special Requests   Final    BOTTLES DRAWN AEROBIC AND ANAEROBIC Blood Culture adequate volume   Culture   Final    NO GROWTH 3 DAYS Performed at [facility], [address] [place], [place] [address]    Report Status PENDING  Incomplete     Scheduled Meds: . vitamin C  500 mg Oral Daily  . baricitinib  2 mg Oral Q24H  . enoxaparin (LOVENOX) injection  40 mg Subcutaneous Q24H  . insulin aspart  0-20 Units Subcutaneous TID WC  . insulin aspart  0-5 Units Subcutaneous QHS  . insulin aspart  4 Units Subcutaneous TID WC  . insulin glargine  20 Units Subcutaneous Daily  . methylPREDNISolone (SOLU-MEDROL) injection  80 mg Intravenous Q12H  . zinc sulfate  220 mg Oral Daily   Continuous Infusions: . 0.9 % NaCl with KCl 40 mEq / L 75 mL/hr at 08/01/20 0615  . remdesivir 100 mg in NS 100 mL  100 mg (08/02/20 1003)    Procedures/Studies: DG Chest 2 View  Result Date: 07/30/2020 CLINICAL DATA:  Hypoxemia EXAM: CHEST - 2 VIEW COMPARISON:  2009 FINDINGS: There is interstitial prominence with patchy ill-defined density bilaterally at the lung bases primarily. No large pleural effusion. No pneumothorax. Cardiomediastinal contours are within normal limits. No acute osseous abnormality. Surgical clips overlie the right chest wall. IMPRESSION: Interstitial prominence and patchy ill-defined density at the lung bases, which may reflect edema or atypical/viral pneumonia. Electronically Signed   By: Macy Mis M.D.   On: 07/30/2020 16:23   CT HEAD WO CONTRAST  Result Date: 07/30/2020 CLINICAL DATA:  Altered mental status EXAM: CT HEAD WITHOUT CONTRAST TECHNIQUE: Contiguous axial images were obtained from the base of the skull through the vertex without intravenous contrast. COMPARISON:  None. FINDINGS: Brain: Normal anatomic configuration. Parenchymal volume loss is commensurate with the patient's age. Mild periventricular white matter changes are present likely reflecting the sequela of small vessel ischemia. No abnormal intra or extra-axial mass lesion or fluid collection.  No abnormal mass effect or midline shift. No evidence of acute intracranial hemorrhage or infarct. Ventricular size is normal. Cerebellum unremarkable. Vascular: No asymmetric hyperdense vasculature at the skull base. Skull: Intact Sinuses/Orbits: Paranasal sinuses are clear. Orbits are unremarkable. Other: Mastoid air cells and middle ear cavities are clear. IMPRESSION: Mild senescent change. No evidence of acute intracranial hemorrhage or infarct. Electronically Signed   By: Fidela Salisbury MD   On: 07/30/2020 16:35   MR ANGIO HEAD WO CONTRAST  Result Date: 07/31/2020 CLINICAL DATA:  Neuro deficit, acute, stroke suspected; right lower extremity weakness, COVID pneumonia. EXAM: MRI HEAD WITHOUT CONTRAST MRA HEAD WITHOUT  CONTRAST TECHNIQUE: Multiplanar, multiecho pulse sequences of the brain and surrounding structures were obtained without intravenous contrast. Angiographic images of the head were obtained using MRA technique without contrast. COMPARISON:  Noncontrast head CT 07/30/2020. FINDINGS: MRI HEAD FINDINGS Brain: Mild generalized cerebral atrophy with associated prominence of ventricles and sulci. No significant white matter disease for age. There is no acute infarct. No evidence of intracranial mass. No chronic intracranial blood products. No extra-axial fluid collection. No midline shift. Vascular: Reported below. Skull and upper cervical spine: No focal marrow lesion. Sinuses/Orbits: Visualized orbits show no acute finding. Minimal ethmoid sinus mucosal thickening. Trace fluid within the bilateral mastoid air cells. MRA HEAD FINDINGS The intracranial internal carotid arteries are patent. The M1 middle cerebral arteries are patent without significant stenosis. No M2 proximal branch occlusion. Atherosclerotic irregularity of M2 and more distal MCA branch vessels bilaterally. Most notably, there is a moderate focal stenosis within a proximal superior division left M2 MCA branch vessel. Apparent high-grade stenosis within a mid to distal M2 right MCA branch vessel. The anterior cerebral arteries are patent. The intracranial vertebral arteries are patent. The basilar artery is patent. The posterior cerebral arteries are patent. Atherosclerotic irregularity of the P2 and P3 posterior cerebral arteries bilaterally with sites of apparent high-grade stenosis. Posterior communicating arteries are hypoplastic or absent bilaterally. No intracranial aneurysm is identified. IMPRESSION: MRI brain: 1. No evidence of acute intracranial abnormality, including acute infarction. 2. Mild generalized cerebral atrophy. 3. Minimal ethmoid sinus mucosal thickening. 4. Trace bilateral mastoid effusions. MRA head: 1. No intracranial large vessel  occlusion. 2. Intracranial atherosclerotic disease with multifocal stenoses, most notably as follows. 3. Moderate focal stenosis within a proximal superior division left M2 MCA branch vessel. 4. Apparent high-grade stenosis within a mid to distal M2 right MCA branch vessel. 5. Sites of high-grade stenosis within the P2 and P3 posterior cerebral arteries bilaterally. Electronically Signed   By: Kellie Simmering DO   On: 07/31/2020 08:04   MR BRAIN WO CONTRAST  Result Date: 07/31/2020 CLINICAL DATA:  Neuro deficit, acute, stroke suspected; right lower extremity weakness, COVID pneumonia. EXAM: MRI HEAD WITHOUT CONTRAST MRA HEAD WITHOUT CONTRAST TECHNIQUE: Multiplanar, multiecho pulse sequences of the brain and surrounding structures were obtained without intravenous contrast. Angiographic images of the head were obtained using MRA technique without contrast. COMPARISON:  Noncontrast head CT 07/30/2020. FINDINGS: MRI HEAD FINDINGS Brain: Mild generalized cerebral atrophy with associated prominence of ventricles and sulci. No significant white matter disease for age. There is no acute infarct. No evidence of intracranial mass. No chronic intracranial blood products. No extra-axial fluid collection. No midline shift. Vascular: Reported below. Skull and upper cervical spine: No focal marrow lesion. Sinuses/Orbits: Visualized orbits show no acute finding. Minimal ethmoid sinus mucosal thickening. Trace fluid within the bilateral mastoid air cells. MRA HEAD FINDINGS The intracranial internal carotid arteries are  patent. The M1 middle cerebral arteries are patent without significant stenosis. No M2 proximal branch occlusion. Atherosclerotic irregularity of M2 and more distal MCA branch vessels bilaterally. Most notably, there is a moderate focal stenosis within a proximal superior division left M2 MCA branch vessel. Apparent high-grade stenosis within a mid to distal M2 right MCA branch vessel. The anterior cerebral  arteries are patent. The intracranial vertebral arteries are patent. The basilar artery is patent. The posterior cerebral arteries are patent. Atherosclerotic irregularity of the P2 and P3 posterior cerebral arteries bilaterally with sites of apparent high-grade stenosis. Posterior communicating arteries are hypoplastic or absent bilaterally. No intracranial aneurysm is identified. IMPRESSION: MRI brain: 1. No evidence of acute intracranial abnormality, including acute infarction. 2. Mild generalized cerebral atrophy. 3. Minimal ethmoid sinus mucosal thickening. 4. Trace bilateral mastoid effusions. MRA head: 1. No intracranial large vessel occlusion. 2. Intracranial atherosclerotic disease with multifocal stenoses, most notably as follows. 3. Moderate focal stenosis within a proximal superior division left M2 MCA branch vessel. 4. Apparent high-grade stenosis within a mid to distal M2 right MCA branch vessel. 5. Sites of high-grade stenosis within the P2 and P3 posterior cerebral arteries bilaterally. Electronically Signed   By: Kellie Simmering DO   On: 07/31/2020 08:04   US Carotid Bilateral  Result Date: 07/31/2020 CLINICAL DATA:  74 year old female with stroke EXAM: BILATERAL CAROTID DUPLEX ULTRASOUND TECHNIQUE: Pearline Cables scale imaging, color Doppler and duplex ultrasound were performed of bilateral carotid and vertebral arteries in the neck. COMPARISON:  None. FINDINGS: Criteria: Quantification of carotid stenosis is based on velocity parameters that correlate the residual internal carotid diameter with NASCET-based stenosis levels, using the diameter of the distal internal carotid lumen as the denominator for stenosis measurement. The following velocity measurements were obtained: RIGHT ICA:  Systolic 68 cm/sec, Diastolic 14 cm/sec CCA:  66 cm/sec SYSTOLIC ICA/CCA RATIO:  1.0 ECA:  141 cm/sec LEFT ICA:  Systolic 69 cm/sec, Diastolic 14 cm/sec CCA:  77 cm/sec SYSTOLIC ICA/CCA RATIO:  0.9 ECA:  73 cm/sec Right  Brachial SBP: Not acquired Left Brachial SBP: Not acquired RIGHT CAROTID ARTERY: No significant calcifications of the right common carotid artery. Intermediate waveform maintained. Heterogeneous and partially calcified plaque at the right carotid bifurcation. No significant lumen shadowing. Low resistance waveform of the right ICA. No significant tortuosity. RIGHT VERTEBRAL ARTERY: Antegrade flow with low resistance waveform. LEFT CAROTID ARTERY: No significant calcifications of the left common carotid artery. Intermediate waveform maintained. Heterogeneous and partially calcified plaque at the left carotid bifurcation without significant lumen shadowing. Low resistance waveform of the left ICA. No significant tortuosity. LEFT VERTEBRAL ARTERY:  Antegrade flow with low resistance waveform. 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 Incidental right carotid body tumor, 2.3 cm Signed, Jaime S. Dellia Nims, RPVI [facility] Electronically Signed   By: Corrie Mckusick D.O.   On: 07/31/2020 14:35   DG Foot Complete Right  Result Date: 07/16/2020 Please see detailed radiograph report in office note.  ECHOCARDIOGRAM COMPLETE  Result Date: 07/31/2020    ECHOCARDIOGRAM REPORT   Patient Name:   SINIA ANTOSH Date of Exam: 07/31/2020 Medical Rec #:  [id number]   Height:       60.0 in Accession #:    [id number]  Weight:       149.0 lb Date  of Birth:  Nov 16, 1945   BSA:          1.647 m Patient Age:    29 years    BP:           148/55 mmHg Patient Gender: F           HR:           58 bpm. Exam Location:  [facility] Procedure: 2D Echo Indications:    Stroke 434.91 / I163.9  History:        Patient has prior history of Echocardiogram examinations, most                  recent 03/05/2009. Risk Factors:Diabetes, Hypertension and                 Non-Smoker.  Sonographer:    Leavy Cella RDCS (AE) Referring Phys: [place]  1. Left ventricular ejection fraction, by estimation, is 55 to 60%. The left ventricle has normal function. The left ventricle has no regional wall motion abnormalities. There is mild left ventricular hypertrophy. Left ventricular diastolic parameters are indeterminate.  2. Right ventricular systolic function is normal. The right ventricular size is normal. Tricuspid regurgitation signal is inadequate for assessing PA pressure.  3. The mitral valve is grossly normal. Mild mitral valve regurgitation.  4. The aortic valve is tricuspid. Aortic valve regurgitation is not visualized.  5. The inferior vena cava is normal in size with greater than 50% respiratory variability, suggesting right atrial pressure of 3 mmHg. FINDINGS  Left Ventricle: Left ventricular ejection fraction, by estimation, is 55 to 60%. The left ventricle has normal function. The left ventricle has no regional wall motion abnormalities. The left ventricular internal cavity size was normal in size. There is  mild left ventricular hypertrophy. Left ventricular diastolic parameters are indeterminate. Right Ventricle: The right ventricular size is normal. No increase in right ventricular wall thickness. Right ventricular systolic function is normal. Tricuspid regurgitation signal is inadequate for assessing PA pressure. Left Atrium: Left atrial size was normal in size. Right Atrium: Right atrial size was normal in size. Pericardium: There is no evidence of pericardial effusion. Mitral Valve: The mitral valve is grossly normal. Mild mitral valve regurgitation. Tricuspid Valve: The tricuspid valve is grossly normal. Tricuspid valve regurgitation is trivial. Aortic Valve: The aortic valve is tricuspid. There is mild aortic valve annular calcification. Aortic valve  regurgitation is not visualized. Pulmonic Valve: The pulmonic valve was grossly normal. Pulmonic valve regurgitation is trivial. Aorta: The aortic root is normal in size and structure. Venous: The inferior vena cava is normal in size with greater than 50% respiratory variability, suggesting right atrial pressure of 3 mmHg. IAS/Shunts: No atrial level shunt detected by color flow Doppler.  LEFT VENTRICLE PLAX 2D LVIDd:         5.02 cm  Diastology LVIDs:         2.84 cm  LV e' medial:    6.20 cm/s LV PW:         1.55 cm  LV E/e' medial:  11.0 LV IVS:        1.21 cm  LV e' lateral:   7.07 cm/s LVOT diam:     1.90 cm  LV E/e' lateral: 9.6 LVOT Area:     2.84 cm  RIGHT VENTRICLE RV S prime:     13.70 cm/s TAPSE (M-mode): 2.6 cm LEFT ATRIUM  Index       RIGHT ATRIUM           Index LA diam:        3.50 cm 2.12 cm/m  RA Area:     13.90 cm LA Vol (A2C):   48.2 ml 29.26 ml/m RA Volume:   34.00 ml  20.64 ml/m LA Vol (A4C):   31.6 ml 19.18 ml/m LA Biplane Vol: 39.1 ml 23.74 ml/m   AORTA Ao Root diam: 2.70 cm MITRAL VALVE MV Area (PHT): 3.00 cm    SHUNTS MV Decel Time: 253 msec    Systemic Diam: 1.90 cm MV E velocity: 68.10 cm/s MV A velocity: 64.70 cm/s MV E/A ratio:  1.05 Rozann Lesches MD Electronically signed by Rozann Lesches MD Signature Date/Time: 07/31/2020/12:00:49 PM    Final    [place] EKG SITE RITE  Result Date: 08/02/2020 If Site Rite image not attached, placement could not be confirmed due to current cardiac rhythm.   Orson Eva, DO  [facility]  If 7PM-7AM, please contact night-coverage [email or web address] Password [email or web address] 08/02/2020, 4:22 PM   LOS: 3 days

## 2020-08-02 NOTE — Progress Notes (Signed)
Physical Therapy Treatment Patient Details Name: [name] MRN: [id number] DOB: 11-Jul-1946 Today's Date: 08/02/2020    History of Present Illness [name] is a 74 y.o. female with medical history significant for diabetes mellitus, hypertension, hypothyroidism.    PT Comments    Patient demonstrates increased endurance/distance for taking steps in room without loss of balance, limited to walking at bedside due to fatigue and requires frequent standing rest breaks.  Patient on 8 LPM with SpO2 dropping from 96% to 92% and tolerated sitting up in chair after therapy.  Patient will benefit from continued physical therapy in hospital and recommended venue below to increase strength, balance, endurance for safe ADLs and gait.   Follow Up Recommendations  SNF;Supervision for mobility/OOB;Supervision/Assistance - 24 hour     Equipment Recommendations  None recommended by PT    Recommendations for Other Services       Precautions / Restrictions Precautions Precautions: Fall Restrictions Weight Bearing Restrictions: No    Mobility  Bed Mobility Overal bed mobility: Needs Assistance Bed Mobility: Supine to Sit     Supine to sit: Min assist     General bed mobility comments: increased time, labored movement  Transfers Overall transfer level: Needs assistance Equipment used: Rolling walker (2 wheeled);None Transfers: Sit to/from [employer] to Stand: Min assist Stand pivot transfers: Min assist;Mod assist       General transfer comment: slow labored movement  Ambulation/Gait Ambulation/Gait assistance: Min assist;Mod assist Gait Distance (Feet): 22 Feet Assistive device: Rolling walker (2 wheeled) Gait Pattern/deviations: Decreased step length - right;Decreased step length - left;Decreased stride length Gait velocity: decreased   General Gait Details: ambulated at bedside demonstrating slow labord cadence with frequent standing rest breaks, on 8  LPM O2 with SpO2 dropping from 96% to 92%, limited secondary to fatigue, no loss of balane   Stairs             Wheelchair Mobility    Modified Rankin (Stroke Patients Only)       Balance Overall balance assessment: Needs assistance Sitting-balance support: Feet supported;No upper extremity supported Sitting balance-Leahy Scale: Fair Sitting balance - Comments: fair/good seated at EOB   Standing balance support: During functional activity;Bilateral upper extremity supported Standing balance-Leahy Scale: Fair Standing balance comment: using RW                            Cognition Arousal/Alertness: Awake/alert Behavior During Therapy: WFL for tasks assessed/performed Overall Cognitive Status: Within Functional Limits for tasks assessed                                        Exercises General Exercises - Lower Extremity Long Arc Quad: Seated;AROM;Strengthening;Both;10 reps Hip Flexion/Marching: Seated;AROM;Strengthening;Both;10 reps Toe Raises: Seated;AROM;Strengthening;Both;10 reps Heel Raises: Seated;AROM;Strengthening;Both;10 reps    General Comments        Pertinent Vitals/Pain Pain Assessment: No/denies pain    Home Living                      Prior Function            PT Goals (current goals can now be found in the care plan section) Acute Rehab PT Goals Patient Stated Goal: return home with family to assist PT Goal Formulation: With patient Time For Goal Achievement: 08/14/20 Potential to Achieve Goals: Good Progress  towards PT goals: Progressing toward goals    Frequency    Min 3X/week      PT Plan Current plan remains appropriate    Co-evaluation              AM-PAC PT "6 Clicks" Mobility   Outcome Measure  Help needed turning from your back to your side while in a flat bed without using bedrails?: A Little Help needed moving from lying on your back to sitting on the side of a flat bed  without using bedrails?: A Lot Help needed moving to and from a bed to a chair (including a wheelchair)?: A Lot Help needed standing up from a chair using your arms (e.g., wheelchair or bedside chair)?: A Little Help needed to walk in hospital room?: A Lot Help needed climbing 3-5 steps with a railing? : A Lot 6 Click Score: 14    End of Session Equipment Utilized During Treatment: Oxygen Activity Tolerance: Patient tolerated treatment well;Patient limited by fatigue Patient left: in chair;with call bell/phone within reach;with chair alarm set Nurse Communication: Mobility status PT Visit Diagnosis: Unsteadiness on feet (R26.81);Other abnormalities of gait and mobility (R26.89);Muscle weakness (generalized) (M62.81)     Time: 8719-5974 PT Time Calculation (min) (ACUTE ONLY): 37 min  Charges:  $Therapeutic Exercise: 8-22 mins $Therapeutic Activity: 8-22 mins                     2:43 PM, 08/02/20 Lonell Grandchild, MPT Physical Therapist with [facility] [phone] office [phone] mobile phone

## 2020-08-03 DIAGNOSIS — G9341 Metabolic encephalopathy: Secondary | ICD-10-CM | POA: Diagnosis not present

## 2020-08-03 DIAGNOSIS — U071 COVID-19: Secondary | ICD-10-CM | POA: Diagnosis not present

## 2020-08-03 DIAGNOSIS — J9601 Acute respiratory failure with hypoxia: Secondary | ICD-10-CM | POA: Diagnosis not present

## 2020-08-03 DIAGNOSIS — I10 Essential (primary) hypertension: Secondary | ICD-10-CM | POA: Diagnosis not present

## 2020-08-03 LAB — CBC WITH DIFFERENTIAL/PLATELET
Abs Immature Granulocytes: 0.1 10*3/uL — ABNORMAL HIGH (ref 0.00–0.07)
Basophils Absolute: 0 10*3/uL (ref 0.0–0.1)
Basophils Relative: 0 %
Eosinophils Absolute: 0 10*3/uL (ref 0.0–0.5)
Eosinophils Relative: 0 %
HCT: 39.5 % (ref 36.0–46.0)
Hemoglobin: 12.9 g/dL (ref 12.0–15.0)
Immature Granulocytes: 1 %
Lymphocytes Relative: 10 %
Lymphs Abs: 0.9 10*3/uL (ref 0.7–4.0)
MCH: 29.9 pg (ref 26.0–34.0)
MCHC: 32.7 g/dL (ref 30.0–36.0)
MCV: 91.4 fL (ref 80.0–100.0)
Monocytes Absolute: 0.5 10*3/uL (ref 0.1–1.0)
Monocytes Relative: 6 %
Neutro Abs: 7.1 10*3/uL (ref 1.7–7.7)
Neutrophils Relative %: 83 %
Platelets: 338 10*3/uL (ref 150–400)
RBC: 4.32 MIL/uL (ref 3.87–5.11)
RDW: 13.1 % (ref 11.5–15.5)
WBC: 8.7 10*3/uL (ref 4.0–10.5)
nRBC: 0 % (ref 0.0–0.2)

## 2020-08-03 LAB — COMPREHENSIVE METABOLIC PANEL
ALT: 33 U/L (ref 0–44)
AST: 37 U/L (ref 15–41)
Albumin: 2.7 g/dL — ABNORMAL LOW (ref 3.5–5.0)
Alkaline Phosphatase: 35 U/L — ABNORMAL LOW (ref 38–126)
Anion gap: 11 (ref 5–15)
BUN: 11 mg/dL (ref 8–23)
CO2: 27 mmol/L (ref 22–32)
Calcium: 8.1 mg/dL — ABNORMAL LOW (ref 8.9–10.3)
Chloride: 96 mmol/L — ABNORMAL LOW (ref 98–111)
Creatinine, Ser: 0.5 mg/dL (ref 0.44–1.00)
GFR, Estimated: >60 mL/min (ref >60)
Glucose, Bld: 140 mg/dL — ABNORMAL HIGH (ref 70–99)
Potassium: 4.1 mmol/L (ref 3.5–5.1)
Sodium: 134 mmol/L — ABNORMAL LOW (ref 135–145)
Total Bilirubin: 1 mg/dL (ref 0.3–1.2)
Total Protein: 5.3 g/dL — ABNORMAL LOW (ref 6.5–8.1)

## 2020-08-03 LAB — C-REACTIVE PROTEIN: CRP: 0.5 mg/dL (ref <1.0)

## 2020-08-03 LAB — GLUCOSE, CAPILLARY
Glucose-Capillary: 152 mg/dL — ABNORMAL HIGH (ref 70–99)
Glucose-Capillary: 225 mg/dL — ABNORMAL HIGH (ref 70–99)
Glucose-Capillary: 183 mg/dL — ABNORMAL HIGH (ref 70–99)
Glucose-Capillary: 134 mg/dL — ABNORMAL HIGH (ref 70–99)

## 2020-08-03 LAB — D-DIMER, QUANTITATIVE: D-Dimer, Quant: 1.37 ug/mL-FEU — ABNORMAL HIGH (ref 0.00–0.50)

## 2020-08-03 LAB — FERRITIN: Ferritin: 421 ng/mL — ABNORMAL HIGH (ref 11–307)

## 2020-08-03 LAB — MAGNESIUM: Magnesium: 1.8 mg/dL (ref 1.7–2.4)

## 2020-08-03 LAB — PHOSPHORUS: Phosphorus: 2.5 mg/dL (ref 2.5–4.6)

## 2020-08-03 NOTE — NC FL2 (Signed)
[place] LEVEL OF CARE SCREENING TOOL     IDENTIFICATION  Patient Name: [name] Birthdate: 05-14-1946 Sex: female Admission Date (Current Location): 07/30/2020  [facility] and [place] Number:  [employer] and Address:  [place] [address] [place]      Provider Number: [id number]  Attending Physician Name and Address:  Orson Eva, MD  Relative Name and Phone Number:  Cathie Olden (Daughter) [phone]    Current Level of Care: Hospital Recommended Level of Care: [place] Prior Approval Number:    Date Approved/Denied: 08/03/20 PASRR Number: [id number]  Discharge Plan: SNF    Current Diagnoses: Patient Active Problem List   Diagnosis Date Noted   Uncontrolled type 2 diabetes mellitus with hyperglycemia ([place]) 07/31/2020   Acute respiratory failure with hypoxia ([place]) 07/31/2020   Essential hypertension 89/21/1941   Acute metabolic encephalopathy 74/05/1447   Gastroesophageal reflux disease with esophagitis 06/01/2018   Diverticulosis 04/28/2018   Diabetes ([place]) 04/04/2018   History of adenomatous polyp of colon 04/04/2018   Hyperlipidemia 04/04/2018    Orientation RESPIRATION BLADDER Height & Weight     Self, Time, Situation, Place  O2 (6L) Incontinent Weight: 149 lb 0.5 oz (67.6 kg) Height:  5' (152.4 cm)  BEHAVIORAL SYMPTOMS/MOOD NEUROLOGICAL BOWEL NUTRITION STATUS      Continent Diet (Carb Modified Fluid consistency: Thin; Room service appropriate? Yes)  AMBULATORY STATUS COMMUNICATION OF NEEDS Skin   Limited Assist   Normal                       Personal Care Assistance Level of Assistance  Bathing, Feeding, Dressing Bathing Assistance: Limited assistance Feeding assistance: Independent Dressing Assistance: Limited assistance     Functional Limitations Info  Sight, Hearing, Speech Sight Info: Adequate Hearing Info: Adequate Speech Info: Adequate    SPECIAL  CARE FACTORS FREQUENCY  PT (By licensed PT)     PT Frequency: 5x week              Contractures Contractures Info: Not present    Additional Factors Info  Code Status, Allergies, Insulin Sliding Scale Code Status Info: Full Allergies Info: N/A   Insulin Sliding Scale Info: Insulin sliding scale Insulin aspart (novoLOG) injections 0 - 5 units 0-5 units subcutaneous, daily at bedtime first dose on Wed 07/31/20 at 2200 Correction coverage: resistant (obese, steroids) CVG <70: implement hyperglycemia standing orders and referred to hypoglycemia standard orders sidebar report CBG 70 - 120: 0 units CBG 121 dash 150: 0 units CBG 151 dash 250: 0 units CBG 201 dash 250: 2 units CBG 251 dash 300: 3 units CBG 301 dash 250 : 4 units CBG 351 dash 400: 5 units CBG > 400: called MD and obtained STAT lab verification Insulin aspart (novoLOG) injections 0 - 20 units 0 dash 20 units sub continues, three times daily with meal first dose on Wed 07/31/20 0800 CVG <70: Implement hyperglycemia standing orders and referred to hypoglycemia standard orders sidebar report CBG 70 - 120: 0 units CBG 121 - 150: 3 units CBG 151 - 250: 4 units CBG 201 - 250: 7 units CBG 251 - 300: 11 units CBG 301 - 250 : 15 units CBG 351 - 400: 20 units CBG > 400: called MD and obtained STAT lab verification       Current Medications (08/03/2020):  This is the current hospital active medication list Current Facility-Administered Medications  Medication Dose Route Frequency Provider Last  Rate Last Admin   0.9 % NaCl with KCl 40 mEq / L  infusion   Intravenous Continuous Emokpae, Ejiroghene E, MD 75 mL/hr at 08/02/20 2040 New Bag at 08/02/20 2040   acetaminophen (TYLENOL) tablet 650 mg  650 mg Oral Q6H PRN Emokpae, Ejiroghene E, MD   650 mg at 08/02/20 1038   ascorbic acid (VITAMIN C) tablet 500 mg  500 mg Oral Daily Tat, David, MD   500 mg at 08/03/20 0948   baricitinib (OLUMIANT) tablet 2 mg  2 mg Oral Q24H Tat, David, MD   2 mg at  08/03/20 1300   enoxaparin (LOVENOX) injection 40 mg  40 mg Subcutaneous Q24H Emokpae, Ejiroghene E, MD   40 mg at 08/02/20 2045   guaiFENesin-dextromethorphan (ROBITUSSIN DM) 100-10 MG/5ML syrup 10 mL  10 mL Oral Q4H PRN Emokpae, Ejiroghene E, MD   10 mL at 08/02/20 1709   insulin aspart (novoLOG) injection 0-20 Units  0-20 Units Subcutaneous TID WC Tat, Shanon Brow, MD   4 Units at 08/03/20 1630   insulin aspart (novoLOG) injection 0-5 Units  0-5 Units Subcutaneous QHS Orson Eva, MD   2 Units at 07/31/20 2025   insulin aspart (novoLOG) injection 4 Units  4 Units Subcutaneous TID WC Tat, Shanon Brow, MD   4 Units at 08/03/20 1631   insulin glargine (LANTUS) injection 20 Units  20 Units Subcutaneous Daily Tat, David, MD   20 Units at 08/03/20 0950   linagliptin (TRADJENTA) tablet 5 mg  5 mg Oral Daily Tat, David, MD   5 mg at 08/03/20 0949   methylPREDNISolone sodium succinate (SOLU-MEDROL) 125 mg/2 mL injection 80 mg  80 mg Intravenous Q12H TatShanon Brow, MD   80 mg at 08/03/20 1627   ondansetron (ZOFRAN) tablet 4 mg  4 mg Oral Q6H PRN Emokpae, Ejiroghene E, MD       Or   ondansetron (ZOFRAN) injection 4 mg  4 mg Intravenous Q6H PRN Emokpae, Ejiroghene E, MD       polyethylene glycol (MIRALAX / GLYCOLAX) packet 17 g  17 g Oral Daily PRN Emokpae, Ejiroghene E, MD       zinc sulfate capsule 220 mg  220 mg Oral Daily Tat, David, MD   220 mg at 08/03/20 9470     Discharge Medications: Please see discharge summary for a list of discharge medications.  Relevant Imaging Results:  Relevant Lab Results:   Additional Information PT SSN [id number]  Natasha Bence, LCSW

## 2020-08-03 NOTE — Progress Notes (Signed)
Emiliano Dyer, RN made aware that if PICC needs to be placed today [facility] will need to be contacted.

## 2020-08-03 NOTE — TOC Progression Note (Signed)
Transition of Care [facility]) - Progression Note    Patient Details  Name: [name] MRN: [id number] Date of Birth: 27-Aug-1946  [facility]) CM/SW Contact  Natasha Bence, LCSW Phone Number: 08/03/2020, 6:00 PM  Clinical Narrative:    Per MD, patient now agreeable to SNF. CSW attempted to reach patient by phone. Patient unable to answer. Due to patient's Covid Status, CSW did not inquire about SNF referrals at bedside. CSW contacted patient's daughter. Patient's daughter agreeable to referrals for SNF in [place] and [place]. Patient's daughter reported that she was unaware of individual SNF's rating and quality. CSW referred patient's daughter to Medicare.gov review SNF's pending acceptance. CSW completted PASSR and FL2. TOC to follow.    Expected Discharge Plan: [place] Barriers to Discharge: Continued Medical Work up  Expected Discharge Plan and Services Expected Discharge Plan: [place] Choice: [place] arrangements for the past 2 months: Single Family Home                                       Social Determinants of Health (SDOH) Interventions    Readmission Risk Interventions No flowsheet data found.

## 2020-08-03 NOTE — Progress Notes (Signed)
PROGRESS NOTE  Emma TAGUE [name]:[id number] DOB: 21-Feb-1946 DOA: 07/30/2020 PCP: Redmond School, MD  Brief History: 74 year old female with a history of diabetes mellitus type 2, hypertension, hypothyroidism, diverticulosis presenting with generalized weakness and confusion.Daughter noted patient was more somnolent on 10/18 and worsened on 10/19.Apparently, the patient was slow to respond and barely mumbling words at home. As result, the patient was brought to the emergency department for further evaluation. At the time of my evaluation, the patient was awake and alert and answering simple questions. She had denied any fevers, chills, chest pain, shortness breath, coughing, hemoptysis, diarrhea, abdominal pain, dysuria, hematuria. She did have one episode of nausea/vomiting prior to coming to the emergency department. She complained of generalized weakness with difficulty getting out of bed. She states that she is not vaccinated against COVID-19. She stated that her daughter with whom she lives was diagnosed with Covid approximately 2-1/2 weeks prior to this admission. In the emergency department, the patient was afebrile hemodynamically stable. Oxygen saturation was 85% on room air. COVID-19 PCR RT-PCR was positive. The patient was started on remdesivir and IV steroids.  Assessment/Plan: Acute respiratory failure with hypoxia due to COVID-19 pneumonia -Oxygen demand 15L>>10LHFNC>>>6L -Continue remdesivir -ContinueIV Solu-Medrol -Vitamin C and zinc -Incentive spirometry -Ferritin 1116>>785>>670>>421 -CRP 5.3>>4.1>>1.2>>0.5 -D-dimer 1.67>>1.07>>0.99>>1.37 -PCT 0.15 -Personally reviewed chest x-ray--bilateral patchy interstitial opacities, right greater than left  Acute metabolic encephalopathy -Secondary to hypoxia and COVID-19 pneumonia -Mental status improved, back to baseline -TSH 1.947 -Personally reviewed EKG--sinus rhythm, nonspecific T wave  change  Uncontrolled diabetes mellitus type 2 with hyperglycemia -NovoLog sliding scale -10/19--Hemoglobin A1c--8.7 -IncreaseLantus 20 units daily -Holding Metformin, glimepiride, alogliptin -started linagliptin -add novolog 4 units with meals  Essential hypertension -Holding losartan, HCTZdue to hyponatremia and volume depletion  Hyperlipidemia -Restart simvastatinwhen LFTs are stable/improved  Hypokalemia -Repleted -Magnesium 2.1  Hyponatremia -Secondary to poor solute intake  Depression/anxiety -Restart Lexapro  Deconditioning -PT>>recommends SNF -patient and family initially refused--no agreeable -spoke with SW today for placement     Status is: Inpatient  Remains inpatient appropriate because:Inpatient level of care appropriate due to severity of illness   Dispo: The patient is from:Home Anticipated d/c is CN:OBSJ--[id number] Anticipated d/c date is: 10/24 Patient currently is not medically stable to d/c.        Family Communication:updated daughter10/23/21  Consultants:none  Code Status: FULL  DVT Prophylaxis:  Lovenox   Procedures: As Listed in Progress Note Above  Antibiotics: None   Subjective: Patient denies fevers, chills, headache, chest pain, dyspnea, nausea, vomiting, diarrhea, abdominal pain, dysuria, hematuria, hematochezia, and melena.   Objective: Vitals:   08/02/20 1407 08/02/20 1956 08/02/20 2036 08/03/20 0439  BP: (!) 156/73  (!) 158/64 (!) 159/80  Pulse: 70 73 67 68  Resp: 20 20  (!) 22  Temp: 98 F (36.7 C)  98.1 F (36.7 C) 98.6 F (37 C)  TempSrc: Oral  Oral   SpO2: 100% 93% 94% 92%  Weight:      Height:        Intake/Output Summary (Last 24 hours) at 08/03/2020 1112 Last data filed at 08/03/2020 3662 Gross per 24 hour  Intake --  Output 2200 ml  Net -2200 ml   Weight change:  Exam:   General:  Pt is alert, follows commands  appropriately, not in acute distress  HEENT: No icterus, No thrush, No neck mass, [place]/AT  Cardiovascular: RRR, S1/S2, no rubs, no gallops  Respiratory: bibasilar crackles. No wheeze  Abdomen: Soft/+BS, non tender, non distended, no guarding  Extremities: Nonpitting edema, No lymphangitis, No petechiae, No rashes, no synovitis   Data Reviewed: I have personally reviewed following labs and imaging studies Basic Metabolic Panel: Recent Labs  Lab 07/30/20 1542 07/31/20 0535 08/01/20 0651 08/03/20 0638  NA 130* 132* 133* 134*  K 3.3* 3.7 4.9 4.1  CL 88* 93* 97* 96*  CO2 28 27 23 27   GLUCOSE 307* 314* 240* 140*  BUN 18 18 15 11   CREATININE 0.80 0.73 0.60 0.50  CALCIUM 8.4* 8.1* 8.2* 8.1*  MG  --  2.1 2.1 1.8  PHOS  --  2.9 2.5 2.5   Liver Function Tests: Recent Labs  Lab 07/30/20 1542 07/31/20 0535 08/01/20 0651 08/03/20 0638  AST 68* 46* 48* 37  ALT 45* 33 31 33  ALKPHOS 40 32* 33* 35*  BILITOT 0.8 0.8 0.7 1.0  PROT 6.8 5.6* 5.3* 5.3*  ALBUMIN 3.4* 2.7* 2.7* 2.7*   No results for input(s): LIPASE, AMYLASE in the last 168 hours. No results for input(s): AMMONIA in the last 168 hours. Coagulation Profile: Recent Labs  Lab 07/30/20 1542  INR 1.1   CBC: Recent Labs  Lab 07/30/20 1542 07/31/20 0535 08/01/20 0751 08/03/20 0638  WBC 6.7 2.8* 4.7 8.7  NEUTROABS 5.9 1.9 2.5 7.1  HGB 12.9 11.5* 12.7 12.9  HCT 38.2 34.6* 38.8 39.5  MCV 89.0 89.4 92.8 91.4  PLT 277 250 238 338   Cardiac Enzymes: No results for input(s): CKTOTAL, CKMB, CKMBINDEX, TROPONINI in the last 168 hours. BNP: Invalid input(s): POCBNP CBG: Recent Labs  Lab 08/02/20 0745 08/02/20 1157 08/02/20 1633 08/02/20 2044 08/03/20 0822  GLUCAP 209* 268* 116* 107* 152*   HbA1C: No results for input(s): HGBA1C in the last 72 hours. Urine analysis: No results found for: COLORURINE, APPEARANCEUR, LABSPEC, PHURINE, GLUCOSEU, HGBUR, BILIRUBINUR, KETONESUR, PROTEINUR, UROBILINOGEN, NITRITE,  LEUKOCYTESUR Sepsis Labs: @LABRCNTIP (procalcitonin:4,lacticidven:4) ) Recent Results (from the past 240 hour(s))  Respiratory Panel by RT PCR (Flu A&B, Covid) - Nasopharyngeal Swab     Status: Abnormal   Collection Time: 07/30/20  6:03 PM   Specimen: Nasopharyngeal Swab  Result Value Ref Range Status   SARS Coronavirus 2 by RT PCR POSITIVE (A) NEGATIVE Final    Comment: RESULT CALLED TO, READ BACK BY AND VERIFIED WITH: T EASTER,RN@[id number]  07/30/20 MKELLY (NOTE) SARS-CoV-2 target nucleic acids are DETECTED.  SARS-CoV-2 RNA is generally detectable in upper respiratory specimens  during the acute phase of infection. Positive results are indicative of the presence of the identified virus, but do not rule out bacterial infection or co-infection with other pathogens not detected by the test. Clinical correlation with patient history and other diagnostic information is necessary to determine patient infection status. The expected result is Negative.  Fact Sheet for Patients:  PinkCheek.be  Fact Sheet for Healthcare Providers: GravelBags.it  This test is not yet approved or cleared by the [place] FDA and  has been authorized for detection and/or diagnosis of SARS-CoV-2 by FDA under an Emergency Use Authorization (EUA).  This EUA will remain in effect (meaning this test can be [place] ed) for the duration of  the COVID-19 declaration under Section 564(b)(1) of the Act, 21 U.S.C. section 360bbb-3(b)(1), unless the authorization is terminated or revoked sooner.      Influenza A by PCR NEGATIVE NEGATIVE Final   Influenza B by PCR NEGATIVE NEGATIVE Final    Comment: (NOTE) The Xpert Xpress SARS-CoV-2/FLU/RSV assay is intended as an aid in  the diagnosis of influenza from Nasopharyngeal swab specimens and  should not be used as a sole basis for treatment. Nasal washings and  aspirates are unacceptable for Xpert Xpress  SARS-CoV-2/FLU/RSV  testing.  Fact Sheet for Patients: PinkCheek.be  Fact Sheet for Healthcare Providers: GravelBags.it  This test is not yet approved or cleared by the [place] FDA and  has been authorized for detection and/or diagnosis of SARS-CoV-2 by  FDA under an Emergency Use Authorization (EUA). This EUA will remain  in effect (meaning this test can be used) for the duration of the  Covid-19 declaration under Section 564(b)(1) of the Act, 21  U.S.C. section 360bbb-3(b)(1), unless the authorization is  terminated or revoked. Performed at [facility], [address] [place], [place] [address]   Blood Culture (routine x 2)     Status: None (Preliminary result)   Collection Time: 07/30/20  8:00 PM   Specimen: Left Antecubital; Blood  Result Value Ref Range Status   Specimen Description LEFT ANTECUBITAL  Final   Special Requests   Final    BOTTLES DRAWN AEROBIC AND ANAEROBIC Blood Culture adequate volume   Culture   Final    NO GROWTH 4 DAYS Performed at [id number], [address], [place], [place] [address]    Report Status PENDING  Incomplete  Blood Culture (routine x 2)     Status: None (Preliminary result)   Collection Time: 07/30/20  8:23 PM   Specimen: Left Antecubital; Blood  Result Value Ref Range Status   Specimen Description LEFT ANTECUBITAL  Final   Special Requests   Final    BOTTLES DRAWN AEROBIC AND ANAEROBIC Blood Culture adequate volume   Culture   Final    NO GROWTH 4 DAYS Performed at [facility], [address], [place], [place] [address]    Report Status PENDING  Incomplete     Scheduled Meds: . vitamin C  500 mg Oral Daily  . baricitinib  2 mg Oral Q24H  . enoxaparin (LOVENOX) injection  40 mg Subcutaneous Q24H  . insulin aspart  0-20 Units Subcutaneous TID WC  . insulin aspart  0-5 Units Subcutaneous QHS  . insulin aspart  4 Units Subcutaneous TID WC  . insulin glargine  20 Units  Subcutaneous Daily  . linagliptin  5 mg Oral Daily  . methylPREDNISolone (SOLU-MEDROL) injection  80 mg Intravenous Q12H  . zinc sulfate  220 mg Oral Daily   Continuous Infusions: . 0.9 % NaCl with KCl 40 mEq / L 75 mL/hr at 08/02/20 2040    Procedures/Studies: DG Chest 2 View  Result Date: 07/30/2020 CLINICAL DATA:  Hypoxemia EXAM: CHEST - 2 VIEW COMPARISON:  2009 FINDINGS: There is interstitial prominence with patchy ill-defined density bilaterally at the lung bases primarily. No large pleural effusion. No pneumothorax. Cardiomediastinal contours are within normal limits. No acute osseous abnormality. Surgical clips overlie the right chest wall. IMPRESSION: Interstitial prominence and patchy ill-defined density at the lung bases, which may reflect edema or atypical/viral pneumonia. Electronically Signed   By: Macy Mis M.D.   On: 07/30/2020 16:23   CT HEAD WO CONTRAST  Result Date: 07/30/2020 CLINICAL DATA:  Altered mental status EXAM: CT HEAD WITHOUT CONTRAST TECHNIQUE: Contiguous axial images were obtained from the base of the skull through the vertex without intravenous contrast. COMPARISON:  None. FINDINGS: Brain: Normal anatomic configuration. Parenchymal volume loss is commensurate with the patient's age. Mild periventricular white matter changes are present likely reflecting the sequela of small vessel  ischemia. No abnormal intra or extra-axial mass lesion or fluid collection. No abnormal mass effect or midline shift. No evidence of acute intracranial hemorrhage or infarct. Ventricular size is normal. Cerebellum unremarkable. Vascular: No asymmetric hyperdense vasculature at the skull base. Skull: Intact Sinuses/Orbits: Paranasal sinuses are clear. Orbits are unremarkable. Other: Mastoid air cells and middle ear cavities are clear. IMPRESSION: Mild senescent change. No evidence of acute intracranial hemorrhage or infarct. Electronically Signed   By: Fidela Salisbury MD   On: 07/30/2020  16:35   MR ANGIO HEAD WO CONTRAST  Result Date: 07/31/2020 CLINICAL DATA:  Neuro deficit, acute, stroke suspected; right lower extremity weakness, COVID pneumonia. EXAM: MRI HEAD WITHOUT CONTRAST MRA HEAD WITHOUT CONTRAST TECHNIQUE: Multiplanar, multiecho pulse sequences of the brain and surrounding structures were obtained without intravenous contrast. Angiographic images of the head were obtained using MRA technique without contrast. COMPARISON:  Noncontrast head CT 07/30/2020. FINDINGS: MRI HEAD FINDINGS Brain: Mild generalized cerebral atrophy with associated prominence of ventricles and sulci. No significant white matter disease for age. There is no acute infarct. No evidence of intracranial mass. No chronic intracranial blood products. No extra-axial fluid collection. No midline shift. Vascular: Reported below. Skull and upper cervical spine: No focal marrow lesion. Sinuses/Orbits: Visualized orbits show no acute finding. Minimal ethmoid sinus mucosal thickening. Trace fluid within the bilateral mastoid air cells. MRA HEAD FINDINGS The intracranial internal carotid arteries are patent. The M1 middle cerebral arteries are patent without significant stenosis. No M2 proximal branch occlusion. Atherosclerotic irregularity of M2 and more distal MCA branch vessels bilaterally. Most notably, there is a moderate focal stenosis within a proximal superior division left M2 MCA branch vessel. Apparent high-grade stenosis within a mid to distal M2 right MCA branch vessel. The anterior cerebral arteries are patent. The intracranial vertebral arteries are patent. The basilar artery is patent. The posterior cerebral arteries are patent. Atherosclerotic irregularity of the P2 and P3 posterior cerebral arteries bilaterally with sites of apparent high-grade stenosis. Posterior communicating arteries are hypoplastic or absent bilaterally. No intracranial aneurysm is identified. IMPRESSION: MRI brain: 1. No evidence of acute  intracranial abnormality, including acute infarction. 2. Mild generalized cerebral atrophy. 3. Minimal ethmoid sinus mucosal thickening. 4. Trace bilateral mastoid effusions. MRA head: 1. No intracranial large vessel occlusion. 2. Intracranial atherosclerotic disease with multifocal stenoses, most notably as follows. 3. Moderate focal stenosis within a proximal superior division left M2 MCA branch vessel. 4. Apparent high-grade stenosis within a mid to distal M2 right MCA branch vessel. 5. Sites of high-grade stenosis within the P2 and P3 posterior cerebral arteries bilaterally. Electronically Signed   By: Kellie Simmering DO   On: 07/31/2020 08:04   MR BRAIN WO CONTRAST  Result Date: 07/31/2020 CLINICAL DATA:  Neuro deficit, acute, stroke suspected; right lower extremity weakness, COVID pneumonia. EXAM: MRI HEAD WITHOUT CONTRAST MRA HEAD WITHOUT CONTRAST TECHNIQUE: Multiplanar, multiecho pulse sequences of the brain and surrounding structures were obtained without intravenous contrast. Angiographic images of the head were obtained using MRA technique without contrast. COMPARISON:  Noncontrast head CT 07/30/2020. FINDINGS: MRI HEAD FINDINGS Brain: Mild generalized cerebral atrophy with associated prominence of ventricles and sulci. No significant white matter disease for age. There is no acute infarct. No evidence of intracranial mass. No chronic intracranial blood products. No extra-axial fluid collection. No midline shift. Vascular: Reported below. Skull and upper cervical spine: No focal marrow lesion. Sinuses/Orbits: Visualized orbits show no acute finding. Minimal ethmoid sinus mucosal thickening. Trace fluid within the bilateral mastoid  air cells. MRA HEAD FINDINGS The intracranial internal carotid arteries are patent. The M1 middle cerebral arteries are patent without significant stenosis. No M2 proximal branch occlusion. Atherosclerotic irregularity of M2 and more distal MCA branch vessels bilaterally. Most  notably, there is a moderate focal stenosis within a proximal superior division left M2 MCA branch vessel. Apparent high-grade stenosis within a mid to distal M2 right MCA branch vessel. The anterior cerebral arteries are patent. The intracranial vertebral arteries are patent. The basilar artery is patent. The posterior cerebral arteries are patent. Atherosclerotic irregularity of the P2 and P3 posterior cerebral arteries bilaterally with sites of apparent high-grade stenosis. Posterior communicating arteries are hypoplastic or absent bilaterally. No intracranial aneurysm is identified. IMPRESSION: MRI brain: 1. No evidence of acute intracranial abnormality, including acute infarction. 2. Mild generalized cerebral atrophy. 3. Minimal ethmoid sinus mucosal thickening. 4. Trace bilateral mastoid effusions. MRA head: 1. No intracranial large vessel occlusion. 2. Intracranial atherosclerotic disease with multifocal stenoses, most notably as follows. 3. Moderate focal stenosis within a proximal superior division left M2 MCA branch vessel. 4. Apparent high-grade stenosis within a mid to distal M2 right MCA branch vessel. 5. Sites of high-grade stenosis within the P2 and P3 posterior cerebral arteries bilaterally. Electronically Signed   By: Kellie Simmering DO   On: 07/31/2020 08:04   US Carotid Bilateral  Result Date: 07/31/2020 CLINICAL DATA:  74 year old female with stroke EXAM: BILATERAL CAROTID DUPLEX ULTRASOUND TECHNIQUE: Pearline Cables scale imaging, color Doppler and duplex ultrasound were performed of bilateral carotid and vertebral arteries in the neck. COMPARISON:  None. FINDINGS: Criteria: Quantification of carotid stenosis is based on velocity parameters that correlate the residual internal carotid diameter with NASCET-based stenosis levels, using the diameter of the distal internal carotid lumen as the denominator for stenosis measurement. The following velocity measurements were obtained: RIGHT ICA:  Systolic 68  cm/sec, Diastolic 14 cm/sec CCA:  66 cm/sec SYSTOLIC ICA/CCA RATIO:  1.0 ECA:  141 cm/sec LEFT ICA:  Systolic 69 cm/sec, Diastolic 14 cm/sec CCA:  77 cm/sec SYSTOLIC ICA/CCA RATIO:  0.9 ECA:  73 cm/sec Right Brachial SBP: Not acquired Left Brachial SBP: Not acquired RIGHT CAROTID ARTERY: No significant calcifications of the right common carotid artery. Intermediate waveform maintained. Heterogeneous and partially calcified plaque at the right carotid bifurcation. No significant lumen shadowing. Low resistance waveform of the right ICA. No significant tortuosity. RIGHT VERTEBRAL ARTERY: Antegrade flow with low resistance waveform. LEFT CAROTID ARTERY: No significant calcifications of the left common carotid artery. Intermediate waveform maintained. Heterogeneous and partially calcified plaque at the left carotid bifurcation without significant lumen shadowing. Low resistance waveform of the left ICA. No significant tortuosity. LEFT VERTEBRAL ARTERY:  Antegrade flow with low resistance waveform. 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 Incidental right carotid body tumor, 2.3 cm Signed, Jaime S. Dellia Nims, RPVI [facility] Electronically Signed   By: Corrie Mckusick D.O.   On: 07/31/2020 14:35   DG Foot Complete Right  Result Date: 07/16/2020 Please see detailed radiograph report in office note.  ECHOCARDIOGRAM COMPLETE  Result Date: 07/31/2020    ECHOCARDIOGRAM REPORT   Patient Name:   GEARLDINE LOONEY Date of Exam: 07/31/2020 Medical Rec #:  [id number]   Height:       60.0 in Accession #:    [id number]  Weight:       149.0 lb Date of Birth:  05-Dec-1945   BSA:          1.647 m Patient Age:    71 years    BP:           148/55 mmHg Patient Gender: F            HR:           58 bpm. Exam Location:  [facility] Procedure: 2D Echo Indications:    Stroke 434.91 / I163.9  History:        Patient has prior history of Echocardiogram examinations, most                 recent 03/05/2009. Risk Factors:Diabetes, Hypertension and                 Non-Smoker.  Sonographer:    Leavy Cella RDCS (AE) Referring Phys: [place]  1. Left ventricular ejection fraction, by estimation, is 55 to 60%. The left ventricle has normal function. The left ventricle has no regional wall motion abnormalities. There is mild left ventricular hypertrophy. Left ventricular diastolic parameters are indeterminate.  2. Right ventricular systolic function is normal. The right ventricular size is normal. Tricuspid regurgitation signal is inadequate for assessing PA pressure.  3. The mitral valve is grossly normal. Mild mitral valve regurgitation.  4. The aortic valve is tricuspid. Aortic valve regurgitation is not visualized.  5. The inferior vena cava is normal in size with greater than 50% respiratory variability, suggesting right atrial pressure of 3 mmHg. FINDINGS  Left Ventricle: Left ventricular ejection fraction, by estimation, is 55 to 60%. The left ventricle has normal function. The left ventricle has no regional wall motion abnormalities. The left ventricular internal cavity size was normal in size. There is  mild left ventricular hypertrophy. Left ventricular diastolic parameters are indeterminate. Right Ventricle: The right ventricular size is normal. No increase in right ventricular wall thickness. Right ventricular systolic function is normal. Tricuspid regurgitation signal is inadequate for assessing PA pressure. Left Atrium: Left atrial size was normal in size. Right Atrium: Right atrial size was normal in size. Pericardium: There is no evidence of pericardial effusion. Mitral Valve: The mitral valve is grossly normal. Mild mitral valve regurgitation.  Tricuspid Valve: The tricuspid valve is grossly normal. Tricuspid valve regurgitation is trivial. Aortic Valve: The aortic valve is tricuspid. There is mild aortic valve annular calcification. Aortic valve regurgitation is not visualized. Pulmonic Valve: The pulmonic valve was grossly normal. Pulmonic valve regurgitation is trivial. Aorta: The aortic root is normal in size and structure. Venous: The inferior vena cava is normal in size with greater than 50% respiratory variability, suggesting right atrial pressure of 3 mmHg. IAS/Shunts: No atrial level shunt detected by color flow Doppler.  LEFT VENTRICLE PLAX 2D LVIDd:         5.02 cm  Diastology LVIDs:         2.84 cm  LV e' medial:    6.20 cm/s LV PW:         1.55 cm  LV E/e' medial:  11.0 LV IVS:        1.21 cm  LV e' lateral:   7.07 cm/s LVOT diam:     1.90 cm  LV E/e' lateral: 9.6 LVOT Area:     2.84 cm  RIGHT VENTRICLE RV S prime:     13.70 cm/s TAPSE (M-mode): 2.6 cm LEFT ATRIUM  Index       RIGHT ATRIUM           Index LA diam:        3.50 cm 2.12 cm/m  RA Area:     13.90 cm LA Vol (A2C):   48.2 ml 29.26 ml/m RA Volume:   34.00 ml  20.64 ml/m LA Vol (A4C):   31.6 ml 19.18 ml/m LA Biplane Vol: 39.1 ml 23.74 ml/m   AORTA Ao Root diam: 2.70 cm MITRAL VALVE MV Area (PHT): 3.00 cm    SHUNTS MV Decel Time: 253 msec    Systemic Diam: 1.90 cm MV E velocity: 68.10 cm/s MV A velocity: 64.70 cm/s MV E/A ratio:  1.05 Rozann Lesches MD Electronically signed by Rozann Lesches MD Signature Date/Time: 07/31/2020/12:00:49 PM    Final    [place] EKG SITE RITE  Result Date: 08/02/2020 If Site Rite image not attached, placement could not be confirmed due to current cardiac rhythm.   Orson Eva, DO  [facility]  If 7PM-7AM, please contact night-coverage [email or web address] Password [email or web address] 08/03/2020, 11:12 AM   LOS: 4 days

## 2020-08-04 DIAGNOSIS — G9341 Metabolic encephalopathy: Secondary | ICD-10-CM | POA: Diagnosis not present

## 2020-08-04 DIAGNOSIS — U071 COVID-19: Secondary | ICD-10-CM | POA: Diagnosis not present

## 2020-08-04 DIAGNOSIS — I10 Essential (primary) hypertension: Secondary | ICD-10-CM | POA: Diagnosis not present

## 2020-08-04 DIAGNOSIS — J9601 Acute respiratory failure with hypoxia: Secondary | ICD-10-CM | POA: Diagnosis not present

## 2020-08-04 LAB — CBC WITH DIFFERENTIAL/PLATELET
Abs Immature Granulocytes: 0.22 10*3/uL — ABNORMAL HIGH (ref 0.00–0.07)
Basophils Absolute: 0 10*3/uL (ref 0.0–0.1)
Basophils Relative: 0 %
Eosinophils Absolute: 0 10*3/uL (ref 0.0–0.5)
Eosinophils Relative: 0 %
HCT: 42.4 % (ref 36.0–46.0)
Hemoglobin: 13.8 g/dL (ref 12.0–15.0)
Immature Granulocytes: 2 %
Lymphocytes Relative: 12 %
Lymphs Abs: 1.3 10*3/uL (ref 0.7–4.0)
MCH: 29.2 pg (ref 26.0–34.0)
MCHC: 32.5 g/dL (ref 30.0–36.0)
MCV: 89.8 fL (ref 80.0–100.0)
Monocytes Absolute: 0.6 10*3/uL (ref 0.1–1.0)
Monocytes Relative: 6 %
Neutro Abs: 9 10*3/uL — ABNORMAL HIGH (ref 1.7–7.7)
Neutrophils Relative %: 80 %
Platelets: 364 10*3/uL (ref 150–400)
RBC: 4.72 MIL/uL (ref 3.87–5.11)
RDW: 13 % (ref 11.5–15.5)
WBC: 11.2 10*3/uL — ABNORMAL HIGH (ref 4.0–10.5)
nRBC: 0 % (ref 0.0–0.2)

## 2020-08-04 LAB — CULTURE, BLOOD (ROUTINE X 2)
Culture: NO GROWTH
Culture: NO GROWTH
Special Requests: ADEQUATE
Special Requests: ADEQUATE

## 2020-08-04 LAB — COMPREHENSIVE METABOLIC PANEL
ALT: 38 U/L (ref 0–44)
AST: 40 U/L (ref 15–41)
Albumin: 2.9 g/dL — ABNORMAL LOW (ref 3.5–5.0)
Alkaline Phosphatase: 36 U/L — ABNORMAL LOW (ref 38–126)
Anion gap: 8 (ref 5–15)
BUN: 12 mg/dL (ref 8–23)
CO2: 26 mmol/L (ref 22–32)
Calcium: 8.2 mg/dL — ABNORMAL LOW (ref 8.9–10.3)
Chloride: 98 mmol/L (ref 98–111)
Creatinine, Ser: 0.56 mg/dL (ref 0.44–1.00)
GFR, Estimated: >60 mL/min (ref >60)
Glucose, Bld: 72 mg/dL (ref 70–99)
Potassium: 4.3 mmol/L (ref 3.5–5.1)
Sodium: 132 mmol/L — ABNORMAL LOW (ref 135–145)
Total Bilirubin: 1 mg/dL (ref 0.3–1.2)
Total Protein: 5.5 g/dL — ABNORMAL LOW (ref 6.5–8.1)

## 2020-08-04 LAB — GLUCOSE, CAPILLARY
Glucose-Capillary: 105 mg/dL — ABNORMAL HIGH (ref 70–99)
Glucose-Capillary: 183 mg/dL — ABNORMAL HIGH (ref 70–99)
Glucose-Capillary: 230 mg/dL — ABNORMAL HIGH (ref 70–99)
Glucose-Capillary: 182 mg/dL — ABNORMAL HIGH (ref 70–99)

## 2020-08-04 LAB — D-DIMER, QUANTITATIVE: D-Dimer, Quant: 1.48 ug/mL-FEU — ABNORMAL HIGH (ref 0.00–0.50)

## 2020-08-04 LAB — PHOSPHORUS: Phosphorus: 2.1 mg/dL — ABNORMAL LOW (ref 2.5–4.6)

## 2020-08-04 LAB — C-REACTIVE PROTEIN: CRP: 0.5 mg/dL (ref <1.0)

## 2020-08-04 LAB — MAGNESIUM: Magnesium: 1.8 mg/dL (ref 1.7–2.4)

## 2020-08-04 LAB — FERRITIN: Ferritin: 414 ng/mL — ABNORMAL HIGH (ref 11–307)

## 2020-08-04 MED ORDER — LOSARTAN POTASSIUM 50 MG PO TABS
25.0000 mg | ORAL_TABLET | Freq: Every day | ORAL | Status: DC
  Administered 2020-08-04 – 2020-08-05 (×2): 25 mg via ORAL
  Filled 2020-08-04 (×2): qty 1

## 2020-08-04 NOTE — Progress Notes (Signed)
PROGRESS NOTE  [name] NID:[id number] DOB: 1946/06/25 DOA: 07/30/2020 PCP: [employer], MD Brief History: 74 year old female with a history of diabetes mellitus type 2, hypertension, hypothyroidism, diverticulosis presenting with generalized weakness and confusion.Daughter noted patient was more somnolent on 10/18 and worsened on 10/19.Apparently, the patient was slow to respond and barely mumbling words at home. As result, the patient was brought to the emergency department for further evaluation. At the time of my evaluation, the patient was awake and alert and answering simple questions. She had denied any fevers, chills, chest pain, shortness breath, coughing, hemoptysis, diarrhea, abdominal pain, dysuria, hematuria. She did have one episode of nausea/vomiting prior to coming to the emergency department. She complained of generalized weakness with difficulty getting out of bed. She states that she is not vaccinated against COVID-19. She stated that her daughter with whom she lives was diagnosed with Covid approximately 2-1/2 weeks prior to this admission. In the emergency department, the patient was afebrile hemodynamically stable. Oxygen saturation was 85% on room air. COVID-19 PCR RT-PCR was positive. The patient was started on remdesivir and IV steroids.  Assessment/Plan: Acute respiratory failure with hypoxia due to COVID-19 pneumonia -Oxygen demand 15L>>10LHFNC>>>6L -Finished remdesivir 10/24 -ContinueIV Solu-Medrol -Vitamin C and zinc -Incentive spirometry -Ferritin 1116>>785>>670>>421>>414 -CRP 5.3>>4.1>>1.2>>0.5>>0.5 -D-dimer 1.67>>1.07>>0.99>>1.37>>1.48 -PCT 0.15 -Personally reviewed chest x-ray--bilateral patchy interstitial opacities, right greater than left  Acute metabolic encephalopathy -Secondary to hypoxia and COVID-19 pneumonia -Mental status improved, back to baseline -TSH 1.947 -Personally reviewed EKG--sinus rhythm, nonspecific  T wave change  Uncontrolled diabetes mellitus type 2 with hyperglycemia -NovoLog sliding scale -10/19--Hemoglobin A1c--8.7 -IncreasedLantus 20 units daily -Holding Metformin, glimepiride, alogliptin -started linagliptin -add novolog4units with meals  Essential hypertension -Holding losartan, HCTZdue to hyponatremia and volume depletion initially -restart losartan  Hyperlipidemia -Restart simvastatinwhen LFTs are stable/improved  Hypokalemia -Repleted -Magnesium 2.1  Hyponatremia -Secondary to poor solute intake  Depression/anxiety -Restart Lexapro  Deconditioning -PT>>recommends SNF -patient and family initially refused--no agreeable -spoke with SW today for placement     Status is: Inpatient  Remains inpatient appropriate because:Inpatient level of care appropriate due to severity of illness   Dispo: The patient is from:Home Anticipated d/c is RW:[facility]--[facility] Anticipated d/c date is: 10/25 Patient currently is not medically stable to d/c.        Family Communication:updated daughter10/23/21  Consultants:none  Code Status: FULL  DVT Prophylaxis: Lane Lovenox   Procedures: As Listed in Progress Note Above  Antibiotics: None      Subjective: Patient overall continues to breath better.  Denies f/c, cp, n/v/d.  Has some dyspnea on exertion.  Denies abd pain  Objective: Vitals:   08/03/20 2007 08/03/20 2117 08/04/20 0518 08/04/20 0820  BP:  (!) 177/80 (!) 148/65 (!) 152/69  Pulse: 72 74 71 73  Resp: 20 17 17 18   Temp:  98 F (36.7 C) 98.5 F (36.9 C) 97.7 F (36.5 C)  TempSrc:    Oral  SpO2: 94% 90% 92% 93%  Weight:      Height:        Intake/Output Summary (Last 24 hours) at 08/04/2020 1040 Last data filed at 08/04/2020 0347 Gross per 24 hour  Intake --  Output 2100 ml  Net -2100 ml   Weight change:  Exam:   General:  Pt is alert, follows commands  appropriately, not in acute distress  HEENT: No icterus, No thrush, No neck mass, [place]/AT  Cardiovascular: RRR, S1/S2, no rubs, no gallops  Respiratory: bibasilar rales. No wheeze  Abdomen: Soft/+BS, non tender, non distended, no guarding  Extremities: No edema, No lymphangitis, No petechiae, No rashes, no synovitis   Data Reviewed: I have personally reviewed following labs and imaging studies Basic Metabolic Panel: Recent Labs  Lab 07/30/20 1542 07/31/20 0535 08/01/20 0651 08/03/20 0638 08/04/20 0639  NA 130* 132* 133* 134* 132*  K 3.3* 3.7 4.9 4.1 4.3  CL 88* 93* 97* 96* 98  CO2 28 27 23 27 26   GLUCOSE 307* 314* 240* 140* 72  BUN 18 18 15 11 12   CREATININE 0.80 0.73 0.60 0.50 0.56  CALCIUM 8.4* 8.1* 8.2* 8.1* 8.2*  MG  --  2.1 2.1 1.8 1.8  PHOS  --  2.9 2.5 2.5 2.1*   Liver Function Tests: Recent Labs  Lab 07/30/20 1542 07/31/20 0535 08/01/20 0651 08/03/20 0638 08/04/20 0639  AST 68* 46* 48* 37 40  ALT 45* 33 31 33 38  ALKPHOS 40 32* 33* 35* 36*  BILITOT 0.8 0.8 0.7 1.0 1.0  PROT 6.8 5.6* 5.3* 5.3* 5.5*  ALBUMIN 3.4* 2.7* 2.7* 2.7* 2.9*   No results for input(s): LIPASE, AMYLASE in the last 168 hours. No results for input(s): AMMONIA in the last 168 hours. Coagulation Profile: Recent Labs  Lab 07/30/20 1542  INR 1.1   CBC: Recent Labs  Lab 07/30/20 1542 07/31/20 0535 08/01/20 0751 08/03/20 0638 08/04/20 0639  WBC 6.7 2.8* 4.7 8.7 11.2*  NEUTROABS 5.9 1.9 2.5 7.1 9.0*  HGB 12.9 11.5* 12.7 12.9 13.8  HCT 38.2 34.6* 38.8 39.5 42.4  MCV 89.0 89.4 92.8 91.4 89.8  PLT 277 250 238 338 364   Cardiac Enzymes: No results for input(s): CKTOTAL, CKMB, CKMBINDEX, TROPONINI in the last 168 hours. BNP: Invalid input(s): POCBNP CBG: Recent Labs  Lab 08/03/20 0822 08/03/20 1150 08/03/20 1614 08/03/20 2120 08/04/20 0818  GLUCAP 152* 225* 183* 134* 105*   HbA1C: No results for input(s): HGBA1C in the last 72 hours. Urine analysis: No results found  for: COLORURINE, APPEARANCEUR, LABSPEC, PHURINE, GLUCOSEU, HGBUR, BILIRUBINUR, KETONESUR, PROTEINUR, UROBILINOGEN, NITRITE, LEUKOCYTESUR Sepsis Labs: @LABRCNTIP (procalcitonin:4,lacticidven:4) ) Recent Results (from the past 240 hour(s))  Respiratory Panel by RT PCR (Flu A&B, Covid) - Nasopharyngeal Swab     Status: Abnormal   Collection Time: 07/30/20  6:03 PM   Specimen: Nasopharyngeal Swab  Result Value Ref Range Status   SARS Coronavirus 2 by RT PCR POSITIVE (A) NEGATIVE Final    Comment: RESULT CALLED TO, READ BACK BY AND VERIFIED WITH: T EASTER,RN@[id number]  07/30/20 MKELLY (NOTE) SARS-CoV-2 target nucleic acids are DETECTED.  SARS-CoV-2 RNA is generally detectable in upper respiratory specimens  during the acute phase of infection. Positive results are indicative of the presence of the identified virus, but do not rule out bacterial infection or co-infection with other pathogens not detected by the test. Clinical correlation with patient history and other diagnostic information is necessary to determine patient infection status. The expected result is Negative.  Fact Sheet for Patients:  PinkCheek.be  Fact Sheet for Healthcare Providers: GravelBags.it  This test is not yet approved or cleared by the [place] FDA and  has been authorized for detection and/or diagnosis of SARS-CoV-2 by FDA under an Emergency Use Authorization (EUA).  This EUA will remain in effect (meaning this test can be [place] ed) for the duration of  the COVID-19 declaration under Section 564(b)(1) of the Act, 21 U.S.C. section 360bbb-3(b)(1), unless the authorization is terminated or revoked sooner.  Influenza A by PCR NEGATIVE NEGATIVE Final   Influenza B by PCR NEGATIVE NEGATIVE Final    Comment: (NOTE) The Xpert Xpress SARS-CoV-2/FLU/RSV assay is intended as an aid in  the diagnosis of influenza from Nasopharyngeal swab specimens and    should not be used as a sole basis for treatment. Nasal washings and  aspirates are unacceptable for Xpert Xpress SARS-CoV-2/FLU/RSV  testing.  Fact Sheet for Patients: PinkCheek.be  Fact Sheet for Healthcare Providers: GravelBags.it  This test is not yet approved or cleared by the [place] FDA and  has been authorized for detection and/or diagnosis of SARS-CoV-2 by  FDA under an Emergency Use Authorization (EUA). This EUA will remain  in effect (meaning this test can be used) for the duration of the  Covid-19 declaration under Section 564(b)(1) of the Act, 21  U.S.C. section 360bbb-3(b)(1), unless the authorization is  terminated or revoked. Performed at [facility], [address], [place], [place] [address]   Blood Culture (routine x 2)     Status: None (Preliminary result)   Collection Time: 07/30/20  8:00 PM   Specimen: Left Antecubital; Blood  Result Value Ref Range Status   Specimen Description LEFT ANTECUBITAL  Final   Special Requests   Final    BOTTLES DRAWN AEROBIC AND ANAEROBIC Blood Culture adequate volume   Culture   Final    NO GROWTH 4 DAYS Performed at [facility], [address] [place], [place] [address]    Report Status PENDING  Incomplete  Blood Culture (routine x 2)     Status: None (Preliminary result)   Collection Time: 07/30/20  8:23 PM   Specimen: Left Antecubital; Blood  Result Value Ref Range Status   Specimen Description LEFT ANTECUBITAL  Final   Special Requests   Final    BOTTLES DRAWN AEROBIC AND ANAEROBIC Blood Culture adequate volume   Culture   Final    NO GROWTH 4 DAYS Performed at [facility], [address] [place],  [address]    Report Status PENDING  Incomplete     Scheduled Meds: . vitamin C  500 mg Oral Daily  . baricitinib  2 mg Oral Q24H  . enoxaparin (LOVENOX) injection  40 mg Subcutaneous Q24H  . insulin aspart  0-20 Units Subcutaneous TID WC  .  insulin aspart  0-5 Units Subcutaneous QHS  . insulin aspart  4 Units Subcutaneous TID WC  . insulin glargine  20 Units Subcutaneous Daily  . linagliptin  5 mg Oral Daily  . methylPREDNISolone (SOLU-MEDROL) injection  80 mg Intravenous Q12H  . zinc sulfate  220 mg Oral Daily   Continuous Infusions: . 0.9 % NaCl with KCl 40 mEq / L 75 mL/hr at 08/04/20 4097    Procedures/Studies: DG Chest 2 View  Result Date: 07/30/2020 CLINICAL DATA:  Hypoxemia EXAM: CHEST - 2 VIEW COMPARISON:  2009 FINDINGS: There is interstitial prominence with patchy ill-defined density bilaterally at the lung bases primarily. No large pleural effusion. No pneumothorax. Cardiomediastinal contours are within normal limits. No acute osseous abnormality. Surgical clips overlie the right chest wall. IMPRESSION: Interstitial prominence and patchy ill-defined density at the lung bases, which may reflect edema or atypical/viral pneumonia. Electronically Signed   By: Macy Mis M.D.   On: 07/30/2020 16:23   CT HEAD WO CONTRAST  Result Date: 07/30/2020 CLINICAL DATA:  Altered mental status EXAM: CT HEAD WITHOUT CONTRAST TECHNIQUE: Contiguous axial images were obtained from the base of the skull through the vertex  without intravenous contrast. COMPARISON:  None. FINDINGS: Brain: Normal anatomic configuration. Parenchymal volume loss is commensurate with the patient's age. Mild periventricular white matter changes are present likely reflecting the sequela of small vessel ischemia. No abnormal intra or extra-axial mass lesion or fluid collection. No abnormal mass effect or midline shift. No evidence of acute intracranial hemorrhage or infarct. Ventricular size is normal. Cerebellum unremarkable. Vascular: No asymmetric hyperdense vasculature at the skull base. Skull: Intact Sinuses/Orbits: Paranasal sinuses are clear. Orbits are unremarkable. Other: Mastoid air cells and middle ear cavities are clear. IMPRESSION: Mild senescent  change. No evidence of acute intracranial hemorrhage or infarct. Electronically Signed   By: Fidela Salisbury MD   On: 07/30/2020 16:35   MR ANGIO HEAD WO CONTRAST  Result Date: 07/31/2020 CLINICAL DATA:  Neuro deficit, acute, stroke suspected; right lower extremity weakness, COVID pneumonia. EXAM: MRI HEAD WITHOUT CONTRAST MRA HEAD WITHOUT CONTRAST TECHNIQUE: Multiplanar, multiecho pulse sequences of the brain and surrounding structures were obtained without intravenous contrast. Angiographic images of the head were obtained using MRA technique without contrast. COMPARISON:  Noncontrast head CT 07/30/2020. FINDINGS: MRI HEAD FINDINGS Brain: Mild generalized cerebral atrophy with associated prominence of ventricles and sulci. No significant white matter disease for age. There is no acute infarct. No evidence of intracranial mass. No chronic intracranial blood products. No extra-axial fluid collection. No midline shift. Vascular: Reported below. Skull and upper cervical spine: No focal marrow lesion. Sinuses/Orbits: Visualized orbits show no acute finding. Minimal ethmoid sinus mucosal thickening. Trace fluid within the bilateral mastoid air cells. MRA HEAD FINDINGS The intracranial internal carotid arteries are patent. The M1 middle cerebral arteries are patent without significant stenosis. No M2 proximal branch occlusion. Atherosclerotic irregularity of M2 and more distal MCA branch vessels bilaterally. Most notably, there is a moderate focal stenosis within a proximal superior division left M2 MCA branch vessel. Apparent high-grade stenosis within a mid to distal M2 right MCA branch vessel. The anterior cerebral arteries are patent. The intracranial vertebral arteries are patent. The basilar artery is patent. The posterior cerebral arteries are patent. Atherosclerotic irregularity of the P2 and P3 posterior cerebral arteries bilaterally with sites of apparent high-grade stenosis. Posterior communicating  arteries are hypoplastic or absent bilaterally. No intracranial aneurysm is identified. IMPRESSION: MRI brain: 1. No evidence of acute intracranial abnormality, including acute infarction. 2. Mild generalized cerebral atrophy. 3. Minimal ethmoid sinus mucosal thickening. 4. Trace bilateral mastoid effusions. MRA head: 1. No intracranial large vessel occlusion. 2. Intracranial atherosclerotic disease with multifocal stenoses, most notably as follows. 3. Moderate focal stenosis within a proximal superior division left M2 MCA branch vessel. 4. Apparent high-grade stenosis within a mid to distal M2 right MCA branch vessel. 5. Sites of high-grade stenosis within the P2 and P3 posterior cerebral arteries bilaterally. Electronically Signed   By: Kellie Simmering DO   On: 07/31/2020 08:04   MR BRAIN WO CONTRAST  Result Date: 07/31/2020 CLINICAL DATA:  Neuro deficit, acute, stroke suspected; right lower extremity weakness, COVID pneumonia. EXAM: MRI HEAD WITHOUT CONTRAST MRA HEAD WITHOUT CONTRAST TECHNIQUE: Multiplanar, multiecho pulse sequences of the brain and surrounding structures were obtained without intravenous contrast. Angiographic images of the head were obtained using MRA technique without contrast. COMPARISON:  Noncontrast head CT 07/30/2020. FINDINGS: MRI HEAD FINDINGS Brain: Mild generalized cerebral atrophy with associated prominence of ventricles and sulci. No significant white matter disease for age. There is no acute infarct. No evidence of intracranial mass. No chronic intracranial blood products. No extra-axial fluid  collection. No midline shift. Vascular: Reported below. Skull and upper cervical spine: No focal marrow lesion. Sinuses/Orbits: Visualized orbits show no acute finding. Minimal ethmoid sinus mucosal thickening. Trace fluid within the bilateral mastoid air cells. MRA HEAD FINDINGS The intracranial internal carotid arteries are patent. The M1 middle cerebral arteries are patent without  significant stenosis. No M2 proximal branch occlusion. Atherosclerotic irregularity of M2 and more distal MCA branch vessels bilaterally. Most notably, there is a moderate focal stenosis within a proximal superior division left M2 MCA branch vessel. Apparent high-grade stenosis within a mid to distal M2 right MCA branch vessel. The anterior cerebral arteries are patent. The intracranial vertebral arteries are patent. The basilar artery is patent. The posterior cerebral arteries are patent. Atherosclerotic irregularity of the P2 and P3 posterior cerebral arteries bilaterally with sites of apparent high-grade stenosis. Posterior communicating arteries are hypoplastic or absent bilaterally. No intracranial aneurysm is identified. IMPRESSION: MRI brain: 1. No evidence of acute intracranial abnormality, including acute infarction. 2. Mild generalized cerebral atrophy. 3. Minimal ethmoid sinus mucosal thickening. 4. Trace bilateral mastoid effusions. MRA head: 1. No intracranial large vessel occlusion. 2. Intracranial atherosclerotic disease with multifocal stenoses, most notably as follows. 3. Moderate focal stenosis within a proximal superior division left M2 MCA branch vessel. 4. Apparent high-grade stenosis within a mid to distal M2 right MCA branch vessel. 5. Sites of high-grade stenosis within the P2 and P3 posterior cerebral arteries bilaterally. Electronically Signed   By: Kellie Simmering DO   On: 07/31/2020 08:04   US Carotid Bilateral  Result Date: 07/31/2020 CLINICAL DATA:  74 year old female with stroke EXAM: BILATERAL CAROTID DUPLEX ULTRASOUND TECHNIQUE: Pearline Cables scale imaging, color Doppler and duplex ultrasound were performed of bilateral carotid and vertebral arteries in the neck. COMPARISON:  None. FINDINGS: Criteria: Quantification of carotid stenosis is based on velocity parameters that correlate the residual internal carotid diameter with NASCET-based stenosis levels, using the diameter of the distal  internal carotid lumen as the denominator for stenosis measurement. The following velocity measurements were obtained: RIGHT ICA:  Systolic 68 cm/sec, Diastolic 14 cm/sec CCA:  66 cm/sec SYSTOLIC ICA/CCA RATIO:  1.0 ECA:  141 cm/sec LEFT ICA:  Systolic 69 cm/sec, Diastolic 14 cm/sec CCA:  77 cm/sec SYSTOLIC ICA/CCA RATIO:  0.9 ECA:  73 cm/sec Right Brachial SBP: Not acquired Left Brachial SBP: Not acquired RIGHT CAROTID ARTERY: No significant calcifications of the right common carotid artery. Intermediate waveform maintained. Heterogeneous and partially calcified plaque at the right carotid bifurcation. No significant lumen shadowing. Low resistance waveform of the right ICA. No significant tortuosity. RIGHT VERTEBRAL ARTERY: Antegrade flow with low resistance waveform. LEFT CAROTID ARTERY: No significant calcifications of the left common carotid artery. Intermediate waveform maintained. Heterogeneous and partially calcified plaque at the left carotid bifurcation without significant lumen shadowing. Low resistance waveform of the left ICA. No significant tortuosity. LEFT VERTEBRAL ARTERY:  Antegrade flow with low resistance waveform. 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 Incidental right carotid body tumor, 2.3 cm Signed, Jaime S. Dellia Nims, RPVI [facility] Electronically Signed   By: Corrie Mckusick D.O.   On: 07/31/2020 14:35   DG Foot Complete Right  Result Date: 07/16/2020 Please see detailed radiograph report in office note.  ECHOCARDIOGRAM COMPLETE  Result Date: 07/31/2020    ECHOCARDIOGRAM REPORT  Patient Name:   DANEYA HARTGROVE Date of Exam: 07/31/2020 Medical Rec #:  [id number]   Height:       60.0 in Accession #:    [id number]   Weight:       149.0 lb Date of Birth:  10/24/1945   BSA:          1.647 m Patient Age:    46 years    BP:           148/55 mmHg Patient Gender: F           HR:           58 bpm. Exam Location:  [facility] Procedure: 2D Echo Indications:    Stroke 434.91 / I163.9  History:        Patient has prior history of Echocardiogram examinations, most                 recent 03/05/2009. Risk Factors:Diabetes, Hypertension and                 Non-Smoker.  Sonographer:    Leavy Cella RDCS (AE) Referring Phys: [place]  1. Left ventricular ejection fraction, by estimation, is 55 to 60%. The left ventricle has normal function. The left ventricle has no regional wall motion abnormalities. There is mild left ventricular hypertrophy. Left ventricular diastolic parameters are indeterminate.  2. Right ventricular systolic function is normal. The right ventricular size is normal. Tricuspid regurgitation signal is inadequate for assessing PA pressure.  3. The mitral valve is grossly normal. Mild mitral valve regurgitation.  4. The aortic valve is tricuspid. Aortic valve regurgitation is not visualized.  5. The inferior vena cava is normal in size with greater than 50% respiratory variability, suggesting right atrial pressure of 3 mmHg. FINDINGS  Left Ventricle: Left ventricular ejection fraction, by estimation, is 55 to 60%. The left ventricle has normal function. The left ventricle has no regional wall motion abnormalities. The left ventricular internal cavity size was normal in size. There is  mild left ventricular hypertrophy. Left ventricular diastolic parameters are indeterminate. Right Ventricle: The right ventricular size is normal. No increase in right ventricular wall thickness. Right ventricular systolic function is normal. Tricuspid regurgitation signal is inadequate for assessing PA pressure. Left Atrium: Left atrial size was normal in size. Right Atrium: Right atrial size was normal in size.  Pericardium: There is no evidence of pericardial effusion. Mitral Valve: The mitral valve is grossly normal. Mild mitral valve regurgitation. Tricuspid Valve: The tricuspid valve is grossly normal. Tricuspid valve regurgitation is trivial. Aortic Valve: The aortic valve is tricuspid. There is mild aortic valve annular calcification. Aortic valve regurgitation is not visualized. Pulmonic Valve: The pulmonic valve was grossly normal. Pulmonic valve regurgitation is trivial. Aorta: The aortic root is normal in size and structure. Venous: The inferior vena cava is normal in size with greater than 50% respiratory variability, suggesting right atrial pressure of 3 mmHg. IAS/Shunts: No atrial level shunt detected by color flow Doppler.  LEFT VENTRICLE PLAX 2D LVIDd:         5.02 cm  Diastology LVIDs:         2.84 cm  LV e' medial:    6.20 cm/s LV PW:         1.55 cm  LV E/e' medial:  11.0 LV IVS:        1.21 cm  LV e' lateral:   7.07 cm/s LVOT diam:  1.90 cm  LV E/e' lateral: 9.6 LVOT Area:     2.84 cm  RIGHT VENTRICLE RV S prime:     13.70 cm/s TAPSE (M-mode): 2.6 cm LEFT ATRIUM             Index       RIGHT ATRIUM           Index LA diam:        3.50 cm 2.12 cm/m  RA Area:     13.90 cm LA Vol (A2C):   48.2 ml 29.26 ml/m RA Volume:   34.00 ml  20.64 ml/m LA Vol (A4C):   31.6 ml 19.18 ml/m LA Biplane Vol: 39.1 ml 23.74 ml/m   AORTA Ao Root diam: 2.70 cm MITRAL VALVE MV Area (PHT): 3.00 cm    SHUNTS MV Decel Time: 253 msec    Systemic Diam: 1.90 cm MV E velocity: 68.10 cm/s MV A velocity: 64.70 cm/s MV E/A ratio:  1.05 Rozann Lesches MD Electronically signed by Rozann Lesches MD Signature Date/Time: 07/31/2020/12:00:49 PM    Final    [place] EKG SITE RITE  Result Date: 08/02/2020 If Site Rite image not attached, placement could not be confirmed due to current cardiac rhythm.   Orson Eva, DO  [facility]  If 7PM-7AM, please contact night-coverage [email or web address] Password [email or web address] 08/04/2020, 10:40  AM   LOS: 5 days

## 2020-08-04 NOTE — Plan of Care (Signed)
  Problem: Education: °Goal: Knowledge of General Education information will improve °Description: Including pain rating scale, medication(s)/side effects and non-pharmacologic comfort measures °Outcome: Progressing °  °Problem: Health Behavior/Discharge Planning: °Goal: Ability to manage health-related needs will improve °Outcome: Progressing °  °Problem: Clinical Measurements: °Goal: Ability to maintain clinical measurements within normal limits will improve °Outcome: Progressing °Goal: Will remain free from infection °Outcome: Progressing °Goal: Diagnostic test results will improve °Outcome: Progressing °Goal: Respiratory complications will improve °Outcome: Progressing °Goal: Cardiovascular complication will be avoided °Outcome: Progressing °  °Problem: Activity: °Goal: Risk for activity intolerance will decrease °Outcome: Progressing °  °Problem: Nutrition: °Goal: Adequate nutrition will be maintained °Outcome: Progressing °  °Problem: Coping: °Goal: Level of anxiety will decrease °Outcome: Progressing °  °Problem: Elimination: °Goal: Will not experience complications related to bowel motility °Outcome: Progressing °Goal: Will not experience complications related to urinary retention °Outcome: Progressing °  °Problem: Pain Managment: °Goal: General experience of comfort will improve °Outcome: Progressing °  °Problem: Safety: °Goal: Ability to remain free from injury will improve °Outcome: Progressing °  °Problem: Skin Integrity: °Goal: Risk for impaired skin integrity will decrease °Outcome: Progressing °  °Problem: Education: °Goal: Knowledge of risk factors and measures for prevention of condition will improve °Outcome: Progressing °  °Problem: Coping: °Goal: Psychosocial and spiritual needs will be supported °Outcome: Progressing °  °Problem: Respiratory: °Goal: Will maintain a patent airway °Outcome: Progressing °Goal: Complications related to the disease process, condition or treatment will be avoided or  minimized °Outcome: Progressing °  °Problem: Education: °Goal: Knowledge of risk factors and measures for prevention of condition will improve °Outcome: Progressing °  °Problem: Coping: °Goal: Psychosocial and spiritual needs will be supported °Outcome: Progressing °  °Problem: Respiratory: °Goal: Will maintain a patent airway °Outcome: Progressing °Goal: Complications related to the disease process, condition or treatment will be avoided or minimized °Outcome: Progressing °  °

## 2020-08-05 DIAGNOSIS — J9610 Chronic respiratory failure, unspecified whether with hypoxia or hypercapnia: Secondary | ICD-10-CM | POA: Diagnosis not present

## 2020-08-05 DIAGNOSIS — K219 Gastro-esophageal reflux disease without esophagitis: Secondary | ICD-10-CM | POA: Diagnosis not present

## 2020-08-05 DIAGNOSIS — N181 Chronic kidney disease, stage 1: Secondary | ICD-10-CM | POA: Diagnosis not present

## 2020-08-05 DIAGNOSIS — R41841 Cognitive communication deficit: Secondary | ICD-10-CM | POA: Diagnosis not present

## 2020-08-05 DIAGNOSIS — J1282 Pneumonia due to coronavirus disease 2019: Secondary | ICD-10-CM | POA: Diagnosis not present

## 2020-08-05 DIAGNOSIS — M6259 Muscle wasting and atrophy, not elsewhere classified, multiple sites: Secondary | ICD-10-CM | POA: Diagnosis not present

## 2020-08-05 DIAGNOSIS — R1312 Dysphagia, oropharyngeal phase: Secondary | ICD-10-CM | POA: Diagnosis not present

## 2020-08-05 DIAGNOSIS — E878 Other disorders of electrolyte and fluid balance, not elsewhere classified: Secondary | ICD-10-CM | POA: Diagnosis not present

## 2020-08-05 DIAGNOSIS — E1165 Type 2 diabetes mellitus with hyperglycemia: Secondary | ICD-10-CM | POA: Diagnosis not present

## 2020-08-05 DIAGNOSIS — J849 Interstitial pulmonary disease, unspecified: Secondary | ICD-10-CM | POA: Diagnosis not present

## 2020-08-05 DIAGNOSIS — E7849 Other hyperlipidemia: Secondary | ICD-10-CM | POA: Diagnosis not present

## 2020-08-05 DIAGNOSIS — J9600 Acute respiratory failure, unspecified whether with hypoxia or hypercapnia: Secondary | ICD-10-CM | POA: Diagnosis not present

## 2020-08-05 DIAGNOSIS — R0689 Other abnormalities of breathing: Secondary | ICD-10-CM | POA: Diagnosis not present

## 2020-08-05 DIAGNOSIS — M6281 Muscle weakness (generalized): Secondary | ICD-10-CM | POA: Diagnosis not present

## 2020-08-05 DIAGNOSIS — F331 Major depressive disorder, recurrent, moderate: Secondary | ICD-10-CM | POA: Diagnosis not present

## 2020-08-05 DIAGNOSIS — R2681 Unsteadiness on feet: Secondary | ICD-10-CM | POA: Diagnosis not present

## 2020-08-05 DIAGNOSIS — I10 Essential (primary) hypertension: Secondary | ICD-10-CM | POA: Diagnosis not present

## 2020-08-05 DIAGNOSIS — E782 Mixed hyperlipidemia: Secondary | ICD-10-CM | POA: Diagnosis not present

## 2020-08-05 DIAGNOSIS — E118 Type 2 diabetes mellitus with unspecified complications: Secondary | ICD-10-CM | POA: Diagnosis not present

## 2020-08-05 DIAGNOSIS — J9601 Acute respiratory failure with hypoxia: Secondary | ICD-10-CM | POA: Diagnosis not present

## 2020-08-05 DIAGNOSIS — G929 Unspecified toxic encephalopathy: Secondary | ICD-10-CM | POA: Diagnosis not present

## 2020-08-05 DIAGNOSIS — F411 Generalized anxiety disorder: Secondary | ICD-10-CM | POA: Diagnosis not present

## 2020-08-05 DIAGNOSIS — F329 Major depressive disorder, single episode, unspecified: Secondary | ICD-10-CM | POA: Diagnosis not present

## 2020-08-05 DIAGNOSIS — Z7401 Bed confinement status: Secondary | ICD-10-CM | POA: Diagnosis not present

## 2020-08-05 DIAGNOSIS — R498 Other voice and resonance disorders: Secondary | ICD-10-CM | POA: Diagnosis not present

## 2020-08-05 DIAGNOSIS — U071 COVID-19: Secondary | ICD-10-CM | POA: Diagnosis not present

## 2020-08-05 DIAGNOSIS — E119 Type 2 diabetes mellitus without complications: Secondary | ICD-10-CM | POA: Diagnosis not present

## 2020-08-05 DIAGNOSIS — I69928 Other speech and language deficits following unspecified cerebrovascular disease: Secondary | ICD-10-CM | POA: Diagnosis not present

## 2020-08-05 DIAGNOSIS — G9341 Metabolic encephalopathy: Secondary | ICD-10-CM | POA: Diagnosis not present

## 2020-08-05 LAB — GLUCOSE, CAPILLARY
Glucose-Capillary: 82 mg/dL (ref 70–99)
Glucose-Capillary: 142 mg/dL — ABNORMAL HIGH (ref 70–99)

## 2020-08-05 MED ORDER — PREDNISONE 20 MG PO TABS: 60.0000 mg | ORAL_TABLET | Freq: Two times a day (BID) | ORAL | Status: DC

## 2020-08-05 MED ORDER — ZINC SULFATE 220 (50 ZN) MG PO CAPS: 220.0000 mg | ORAL_CAPSULE | Freq: Every day | ORAL | Status: DC

## 2020-08-05 MED ORDER — PREDNISONE 20 MG PO TABS
60.0000 mg | ORAL_TABLET | Freq: Two times a day (BID) | ORAL | Status: DC
Start: 2020-08-05 — End: 2021-11-12

## 2020-08-05 MED ORDER — ASCORBIC ACID 500 MG PO TABS
500.0000 mg | ORAL_TABLET | Freq: Every day | ORAL | Status: DC
Start: 2020-08-06 — End: 2023-10-13

## 2020-08-05 NOTE — Progress Notes (Signed)
Patient being discharged to skilled nursing facility. IV removed. Social worker(Bria Glenford Peers) called EMS to transport. Will call report to receiving facility and nurse.

## 2020-08-05 NOTE — Discharge Summary (Signed)
Physician Discharge Summary  LATORIA DRY OMB:[id number] DOB: 1945/12/24 DOA: 07/30/2020  PCP: Redmond School, MD  Admit date: 07/30/2020 Discharge date: 08/05/2020  Admitted From: Home Disposition:  SNF  Recommendations for Outpatient Follow-up:  1. Follow up with PCP in 1-2 weeks 2. Please obtain CMP/CBC in one week 3. Please maintain 4L [place] and wean to room air as tolerated for saturation >92%    Discharge Condition: Stable CODE STATUS: FULL Diet recommendation: Heart Healthy / Carb Modified   Brief/Interim Summary: 74 year old female with a history of diabetes mellitus type 2, hypertension, hypothyroidism, diverticulosis presenting with generalized weakness and confusion.Daughter noted patient was more somnolent on 10/18 and worsened on 10/19.Apparently, the patient was slow to respond and barely mumbling words at home. As result, the patient was brought to the emergency department for further evaluation. At the time of my evaluation, the patient was awake and alert and answering simple questions. She had denied any fevers, chills, chest pain, shortness breath, coughing, hemoptysis, diarrhea, abdominal pain, dysuria, hematuria. She did have one episode of nausea/vomiting prior to coming to the emergency department. She complained of generalized weakness with difficulty getting out of bed. She states that she is not vaccinated against COVID-19. She stated that her daughter with whom she lives was diagnosed with Covid approximately 2-1/2 weeks prior to this admission. In the emergency department, the patient was afebrile hemodynamically stable. Oxygen saturation was 85% on room air. COVID-19 PCR RT-PCR was positive. The patient was started on remdesivir and IV steroids. She slowly improved, but continued to require 4 LNC and remained deconditioned.  PT recommended SNF with which the patient and daughter ultimately agreed.  Discharge Diagnoses:   Acute respiratory  failure with hypoxia due to COVID-19 pneumonia -Oxygen demand 15L>>10LHFNC>>>6L>>4L -Finished remdesivir 10/24 -ContinueIV Solu-Medrol>>>prednisone 60 mg bid x 4 more days after d/c -Vitamin C and zinc -Incentive spirometry -Ferritin 1116>>785>>670>>421>>414 -CRP 5.3>>4.1>>1.2>>0.5>>0.5 -D-dimer 1.67>>1.07>>0.99>>1.37>>1.48 -PCT 0.15 -Personally reviewed chest x-ray--bilateral patchy interstitial opacities, right greater than left  Acute metabolic encephalopathy -Secondary to hypoxia and COVID-19 pneumonia -Mental statusimproved, back to baseline -TSH 1.947 -Personally reviewed EKG--sinus rhythm, nonspecific T wave change  Uncontrolled diabetes mellitus type 2 with hyperglycemia -NovoLog sliding scale -10/19--Hemoglobin A1c--8.7 -IncreasedLantus 20 units daily during hospitalization -Holding Metformin, glimepiride, alogliptin>>restart after d/c -startedlinagliptin -add novolog4units with meals during hospitalization -anticipate improvement with weaning steroids  Essential hypertension -Holding losartan, HCTZdue to hyponatremia and volume depletion initially -restart losartan--increase back to home dose  Hyperlipidemia -Restart simvastatinafter d/c--LFTs improved  Hypokalemia -Repleted -Magnesium 2.1  Hyponatremia -Secondary to poor solute intake-->stable/improved  Depression/anxiety -Restart Lexapro  Deconditioning -PT>>recommends SNF -patient and family initially refused--now agreeable -spoke with SW today for placement    Discharge Instructions   Allergies as of 08/05/2020   No Known Allergies     Medication List    STOP taking these medications   celecoxib 200 MG capsule Commonly known as: CELEBREX   doxycycline 100 MG tablet Commonly known as: VIBRA-TABS   oxyCODONE-acetaminophen 5-325 MG tablet Commonly known as: Percocet     TAKE these medications   Accu-Chek Aviva Plus test strip Generic drug: glucose blood 1 each  by Other route 4 (four) times daily.   Accu-Chek FastClix Lancets Misc U TO TEST QID   Alogliptin Benzoate 12.5 MG Tabs Take 12.5 mg by mouth daily.   ascorbic acid 500 MG tablet Commonly known as: VITAMIN C Take 1 tablet (500 mg total) by mouth daily. X 5 days Start taking on: August 06, 2020  aspirin 325 MG tablet Take 325 mg by mouth daily.   CITRACAL PO Take 1 tablet by mouth daily.   escitalopram 10 MG tablet Commonly known as: LEXAPRO Take 10 mg by mouth daily.   glimepiride 2 MG tablet Commonly known as: AMARYL Take 4 mg by mouth daily.   hydrochlorothiazide 25 MG tablet Commonly known as: HYDRODIURIL Take 25 mg by mouth daily.   levothyroxine 100 MCG tablet Commonly known as: SYNTHROID Take 100 mcg by mouth daily.   losartan 100 MG tablet Commonly known as: COZAAR Take 100 mg by mouth daily.   meloxicam 15 MG tablet Commonly known as: MOBIC TAKE 1 TABLET(15 MG) BY MOUTH DAILY What changed: See the new instructions.   metFORMIN 500 MG tablet Commonly known as: GLUCOPHAGE Take 500 mg by mouth 2 (two) times daily with a meal.   omeprazole 20 MG capsule Commonly known as: PRILOSEC Take 20 mg by mouth daily.   oxybutynin 15 MG 24 hr tablet Commonly known as: DITROPAN XL Take 15 mg by mouth daily.   predniSONE 20 MG tablet Commonly known as: DELTASONE Take 3 tablets (60 mg total) by mouth 2 (two) times daily with a meal. X 4 days   simvastatin 40 MG tablet Commonly known as: ZOCOR Take 40 mg by mouth daily.   Toviaz 8 MG Tb24 tablet Generic drug: fesoterodine Take 8 mg by mouth daily.   VITAMIN D PO Take 1 tablet by mouth daily.   zinc sulfate 220 (50 Zn) MG capsule Take 1 capsule (220 mg total) by mouth daily. X 5 days Start taking on: August 06, 2020       Contact information for after-discharge care    Destination    [facility]erred [facility] .   Service: Skilled Nursing Contact information: [address] [phone]                 No Known Allergies  Consultations:  none   Procedures/Studies: DG Chest 2 View  Result Date: 07/30/2020 CLINICAL DATA:  Hypoxemia EXAM: CHEST - 2 VIEW COMPARISON:  2009 FINDINGS: There is interstitial prominence with patchy ill-defined density bilaterally at the lung bases primarily. No large pleural effusion. No pneumothorax. Cardiomediastinal contours are within normal limits. No acute osseous abnormality. Surgical clips overlie the right chest wall. IMPRESSION: Interstitial prominence and patchy ill-defined density at the lung bases, which may reflect edema or atypical/viral pneumonia. Electronically Signed   By: Macy Mis M.D.   On: 07/30/2020 16:23   CT HEAD WO CONTRAST  Result Date: 07/30/2020 CLINICAL DATA:  Altered mental status EXAM: CT HEAD WITHOUT CONTRAST TECHNIQUE: Contiguous axial images were obtained from the base of the skull through the vertex without intravenous contrast. COMPARISON:  None. FINDINGS: Brain: Normal anatomic configuration. Parenchymal volume loss is commensurate with the patient's age. Mild periventricular white matter changes are present likely reflecting the sequela of small vessel ischemia. No abnormal intra or extra-axial mass lesion or fluid collection. No abnormal mass effect or midline shift. No evidence of acute intracranial hemorrhage or infarct. Ventricular size is normal. Cerebellum unremarkable. Vascular: No asymmetric hyperdense vasculature at the skull base. Skull: Intact Sinuses/Orbits: Paranasal sinuses are clear. Orbits are unremarkable. Other: Mastoid air cells and middle ear cavities are clear. IMPRESSION: Mild senescent change. No evidence of acute intracranial hemorrhage or infarct. Electronically Signed   By: Fidela Salisbury MD   On: 07/30/2020 16:35   MR ANGIO HEAD WO CONTRAST  Result Date:  07/31/2020 CLINICAL DATA:  Neuro deficit, acute, stroke suspected; right lower  extremity weakness, COVID pneumonia. EXAM: MRI HEAD WITHOUT CONTRAST MRA HEAD WITHOUT CONTRAST TECHNIQUE: Multiplanar, multiecho pulse sequences of the brain and surrounding structures were obtained without intravenous contrast. Angiographic images of the head were obtained using MRA technique without contrast. COMPARISON:  Noncontrast head CT 07/30/2020. FINDINGS: MRI HEAD FINDINGS Brain: Mild generalized cerebral atrophy with associated prominence of ventricles and sulci. No significant white matter disease for age. There is no acute infarct. No evidence of intracranial mass. No chronic intracranial blood products. No extra-axial fluid collection. No midline shift. Vascular: Reported below. Skull and upper cervical spine: No focal marrow lesion. Sinuses/Orbits: Visualized orbits show no acute finding. Minimal ethmoid sinus mucosal thickening. Trace fluid within the bilateral mastoid air cells. MRA HEAD FINDINGS The intracranial internal carotid arteries are patent. The M1 middle cerebral arteries are patent without significant stenosis. No M2 proximal branch occlusion. Atherosclerotic irregularity of M2 and more distal MCA branch vessels bilaterally. Most notably, there is a moderate focal stenosis within a proximal superior division left M2 MCA branch vessel. Apparent high-grade stenosis within a mid to distal M2 right MCA branch vessel. The anterior cerebral arteries are patent. The intracranial vertebral arteries are patent. The basilar artery is patent. The posterior cerebral arteries are patent. Atherosclerotic irregularity of the P2 and P3 posterior cerebral arteries bilaterally with sites of apparent high-grade stenosis. Posterior communicating arteries are hypoplastic or absent bilaterally. No intracranial aneurysm is identified. IMPRESSION: MRI brain: 1. No evidence of acute intracranial abnormality, including acute infarction. 2. Mild generalized cerebral atrophy. 3. Minimal ethmoid sinus mucosal  thickening. 4. Trace bilateral mastoid effusions. MRA head: 1. No intracranial large vessel occlusion. 2. Intracranial atherosclerotic disease with multifocal stenoses, most notably as follows. 3. Moderate focal stenosis within a proximal superior division left M2 MCA branch vessel. 4. Apparent high-grade stenosis within a mid to distal M2 right MCA branch vessel. 5. Sites of high-grade stenosis within the P2 and P3 posterior cerebral arteries bilaterally. Electronically Signed   By: Kellie Simmering DO   On: 07/31/2020 08:04   MR BRAIN WO CONTRAST  Result Date: 07/31/2020 CLINICAL DATA:  Neuro deficit, acute, stroke suspected; right lower extremity weakness, COVID pneumonia. EXAM: MRI HEAD WITHOUT CONTRAST MRA HEAD WITHOUT CONTRAST TECHNIQUE: Multiplanar, multiecho pulse sequences of the brain and surrounding structures were obtained without intravenous contrast. Angiographic images of the head were obtained using MRA technique without contrast. COMPARISON:  Noncontrast head CT 07/30/2020. FINDINGS: MRI HEAD FINDINGS Brain: Mild generalized cerebral atrophy with associated prominence of ventricles and sulci. No significant white matter disease for age. There is no acute infarct. No evidence of intracranial mass. No chronic intracranial blood products. No extra-axial fluid collection. No midline shift. Vascular: Reported below. Skull and upper cervical spine: No focal marrow lesion. Sinuses/Orbits: Visualized orbits show no acute finding. Minimal ethmoid sinus mucosal thickening. Trace fluid within the bilateral mastoid air cells. MRA HEAD FINDINGS The intracranial internal carotid arteries are patent. The M1 middle cerebral arteries are patent without significant stenosis. No M2 proximal branch occlusion. Atherosclerotic irregularity of M2 and more distal MCA branch vessels bilaterally. Most notably, there is a moderate focal stenosis within a proximal superior division left M2 MCA branch vessel. Apparent  high-grade stenosis within a mid to distal M2 right MCA branch vessel. The anterior cerebral arteries are patent. The intracranial vertebral arteries are patent. The basilar artery is patent. The posterior cerebral arteries are patent. Atherosclerotic irregularity of the  P2 and P3 posterior cerebral arteries bilaterally with sites of apparent high-grade stenosis. Posterior communicating arteries are hypoplastic or absent bilaterally. No intracranial aneurysm is identified. IMPRESSION: MRI brain: 1. No evidence of acute intracranial abnormality, including acute infarction. 2. Mild generalized cerebral atrophy. 3. Minimal ethmoid sinus mucosal thickening. 4. Trace bilateral mastoid effusions. MRA head: 1. No intracranial large vessel occlusion. 2. Intracranial atherosclerotic disease with multifocal stenoses, most notably as follows. 3. Moderate focal stenosis within a proximal superior division left M2 MCA branch vessel. 4. Apparent high-grade stenosis within a mid to distal M2 right MCA branch vessel. 5. Sites of high-grade stenosis within the P2 and P3 posterior cerebral arteries bilaterally. Electronically Signed   By: Kellie Simmering DO   On: 07/31/2020 08:04   US Carotid Bilateral  Result Date: 07/31/2020 CLINICAL DATA:  74 year old female with stroke EXAM: BILATERAL CAROTID DUPLEX ULTRASOUND TECHNIQUE: Pearline Cables scale imaging, color Doppler and duplex ultrasound were performed of bilateral carotid and vertebral arteries in the neck. COMPARISON:  None. FINDINGS: Criteria: Quantification of carotid stenosis is based on velocity parameters that correlate the residual internal carotid diameter with NASCET-based stenosis levels, using the diameter of the distal internal carotid lumen as the denominator for stenosis measurement. The following velocity measurements were obtained: RIGHT ICA:  Systolic 68 cm/sec, Diastolic 14 cm/sec CCA:  66 cm/sec SYSTOLIC ICA/CCA RATIO:  1.0 ECA:  141 cm/sec LEFT ICA:  Systolic 69  cm/sec, Diastolic 14 cm/sec CCA:  77 cm/sec SYSTOLIC ICA/CCA RATIO:  0.9 ECA:  73 cm/sec Right Brachial SBP: Not acquired Left Brachial SBP: Not acquired RIGHT CAROTID ARTERY: No significant calcifications of the right common carotid artery. Intermediate waveform maintained. Heterogeneous and partially calcified plaque at the right carotid bifurcation. No significant lumen shadowing. Low resistance waveform of the right ICA. No significant tortuosity. RIGHT VERTEBRAL ARTERY: Antegrade flow with low resistance waveform. LEFT CAROTID ARTERY: No significant calcifications of the left common carotid artery. Intermediate waveform maintained. Heterogeneous and partially calcified plaque at the left carotid bifurcation without significant lumen shadowing. Low resistance waveform of the left ICA. No significant tortuosity. LEFT VERTEBRAL ARTERY:  Antegrade flow with low resistance waveform. 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 Incidental right carotid body tumor, 2.3 cm Signed, Jaime S. Dellia Nims, RPVI [facility] Electronically Signed   By: Corrie Mckusick D.O.   On: 07/31/2020 14:35   DG Foot Complete Right  Result Date: 07/16/2020 Please see detailed radiograph report in office note.  ECHOCARDIOGRAM COMPLETE  Result Date: 07/31/2020    ECHOCARDIOGRAM REPORT   Patient Name:   [name] Date of Exam: 07/31/2020 Medical Rec #:  [id number]   Height:       60.0 in Accession #:    [id number]  Weight:       149.0 lb Date of Birth:  1946-01-30   BSA:          1.647 m Patient Age:    2 years    BP:           148/55 mmHg Patient Gender: F           HR:           58 bpm. Exam Location:  [facility] Procedure: 2D Echo Indications:    Stroke 434.91 /  I163.9  History:  Patient has prior history of Echocardiogram examinations, most                 recent 03/05/2009. Risk Factors:Diabetes, Hypertension and                 Non-Smoker.  Sonographer:    Leavy Cella RDCS (AE) Referring Phys: [place]  1. Left ventricular ejection fraction, by estimation, is 55 to 60%. The left ventricle has normal function. The left ventricle has no regional wall motion abnormalities. There is mild left ventricular hypertrophy. Left ventricular diastolic parameters are indeterminate.  2. Right ventricular systolic function is normal. The right ventricular size is normal. Tricuspid regurgitation signal is inadequate for assessing PA pressure.  3. The mitral valve is grossly normal. Mild mitral valve regurgitation.  4. The aortic valve is tricuspid. Aortic valve regurgitation is not visualized.  5. The inferior vena cava is normal in size with greater than 50% respiratory variability, suggesting right atrial pressure of 3 mmHg. FINDINGS  Left Ventricle: Left ventricular ejection fraction, by estimation, is 55 to 60%. The left ventricle has normal function. The left ventricle has no regional wall motion abnormalities. The left ventricular internal cavity size was normal in size. There is  mild left ventricular hypertrophy. Left ventricular diastolic parameters are indeterminate. Right Ventricle: The right ventricular size is normal. No increase in right ventricular wall thickness. Right ventricular systolic function is normal. Tricuspid regurgitation signal is inadequate for assessing PA pressure. Left Atrium: Left atrial size was normal in size. Right Atrium: Right atrial size was normal in size. Pericardium: There is no evidence of pericardial effusion. Mitral Valve: The mitral valve is grossly normal. Mild mitral valve regurgitation. Tricuspid Valve: The tricuspid valve is grossly normal. Tricuspid valve regurgitation is trivial. Aortic Valve: The  aortic valve is tricuspid. There is mild aortic valve annular calcification. Aortic valve regurgitation is not visualized. Pulmonic Valve: The pulmonic valve was grossly normal. Pulmonic valve regurgitation is trivial. Aorta: The aortic root is normal in size and structure. Venous: The inferior vena cava is normal in size with greater than 50% respiratory variability, suggesting right atrial pressure of 3 mmHg. IAS/Shunts: No atrial level shunt detected by color flow Doppler.  LEFT VENTRICLE PLAX 2D LVIDd:         5.02 cm  Diastology LVIDs:         2.84 cm  LV e' medial:    6.20 cm/s LV PW:         1.55 cm  LV E/e' medial:  11.0 LV IVS:        1.21 cm  LV e' lateral:   7.07 cm/s LVOT diam:     1.90 cm  LV E/e' lateral: 9.6 LVOT Area:     2.84 cm  RIGHT VENTRICLE RV S prime:     13.70 cm/s TAPSE (M-mode): 2.6 cm LEFT ATRIUM             Index       RIGHT ATRIUM           Index LA diam:        3.50 cm 2.12 cm/m  RA Area:     13.90 cm LA Vol (A2C):   48.2 ml 29.26 ml/m RA Volume:   34.00 ml  20.64 ml/m LA Vol (A4C):   31.6 ml 19.18 ml/m LA Biplane Vol: 39.1 ml 23.74 ml/m   AORTA Ao Root diam: 2.70 cm MITRAL VALVE MV Area (PHT): 3.00 cm  SHUNTS MV Decel Time: 253 msec    Systemic Diam: 1.90 cm MV E velocity: 68.10 cm/s MV A velocity: 64.70 cm/s MV E/A ratio:  1.05 Rozann Lesches MD Electronically signed by Rozann Lesches MD Signature Date/Time: 07/31/2020/12:00:49 PM    Final    [place] EKG SITE RITE  Result Date: 08/02/2020 If Site Rite image not attached, placement could not be confirmed due to current cardiac rhythm.        Discharge Exam: Vitals:   08/05/20 0616 08/05/20 0845  BP: (!) 174/74 (!) 178/86  Pulse: 66 71  Resp: 18 20  Temp: 98.9 F (37.2 C)   SpO2: 96% 94%   Vitals:   08/04/20 1937 08/04/20 2034 08/05/20 0616 08/05/20 0845  BP:  (!) 184/72 (!) 174/74 (!) 178/86  Pulse: 85 81 66 71  Resp: 20 17 18 20   Temp:  98.2 F (36.8 C) 98.9 F (37.2 C)   TempSrc:   Oral   SpO2:  92% 97% 96% 94%  Weight:      Height:        General: Pt is alert, awake, not in acute distress Cardiovascular: RRR, S1/S2 +, no rubs, no gallops Respiratory: bibasilar crackles. No wheeze Abdominal: Soft, NT, ND, bowel sounds + Extremities: no edema, no cyanosis   The results of significant diagnostics from this hospitalization (including imaging, microbiology, ancillary and laboratory) are listed below for reference.    Significant Diagnostic Studies: DG Chest 2 View  Result Date: 07/30/2020 CLINICAL DATA:  Hypoxemia EXAM: CHEST - 2 VIEW COMPARISON:  2009 FINDINGS: There is interstitial prominence with patchy ill-defined density bilaterally at the lung bases primarily. No large pleural effusion. No pneumothorax. Cardiomediastinal contours are within normal limits. No acute osseous abnormality. Surgical clips overlie the right chest wall. IMPRESSION: Interstitial prominence and patchy ill-defined density at the lung bases, which may reflect edema or atypical/viral pneumonia. Electronically Signed   By: Macy Mis M.D.   On: 07/30/2020 16:23   CT HEAD WO CONTRAST  Result Date: 07/30/2020 CLINICAL DATA:  Altered mental status EXAM: CT HEAD WITHOUT CONTRAST TECHNIQUE: Contiguous axial images were obtained from the base of the skull through the vertex without intravenous contrast. COMPARISON:  None. FINDINGS: Brain: Normal anatomic configuration. Parenchymal volume loss is commensurate with the patient's age. Mild periventricular white matter changes are present likely reflecting the sequela of small vessel ischemia. No abnormal intra or extra-axial mass lesion or fluid collection. No abnormal mass effect or midline shift. No evidence of acute intracranial hemorrhage or infarct. Ventricular size is normal. Cerebellum unremarkable. Vascular: No asymmetric hyperdense vasculature at the skull base. Skull: Intact Sinuses/Orbits: Paranasal sinuses are clear. Orbits are unremarkable. Other: Mastoid  air cells and middle ear cavities are clear. IMPRESSION: Mild senescent change. No evidence of acute intracranial hemorrhage or infarct. Electronically Signed   By: Fidela Salisbury MD   On: 07/30/2020 16:35   MR ANGIO HEAD WO CONTRAST  Result Date: 07/31/2020 CLINICAL DATA:  Neuro deficit, acute, stroke suspected; right lower extremity weakness, COVID pneumonia. EXAM: MRI HEAD WITHOUT CONTRAST MRA HEAD WITHOUT CONTRAST TECHNIQUE: Multiplanar, multiecho pulse sequences of the brain and surrounding structures were obtained without intravenous contrast. Angiographic images of the head were obtained using MRA technique without contrast. COMPARISON:  Noncontrast head CT 07/30/2020. FINDINGS: MRI HEAD FINDINGS Brain: Mild generalized cerebral atrophy with associated prominence of ventricles and sulci. No significant white matter disease for age. There is no acute infarct. No evidence of intracranial mass. No chronic intracranial  blood products. No extra-axial fluid collection. No midline shift. Vascular: Reported below. Skull and upper cervical spine: No focal marrow lesion. Sinuses/Orbits: Visualized orbits show no acute finding. Minimal ethmoid sinus mucosal thickening. Trace fluid within the bilateral mastoid air cells. MRA HEAD FINDINGS The intracranial internal carotid arteries are patent. The M1 middle cerebral arteries are patent without significant stenosis. No M2 proximal branch occlusion. Atherosclerotic irregularity of M2 and more distal MCA branch vessels bilaterally. Most notably, there is a moderate focal stenosis within a proximal superior division left M2 MCA branch vessel. Apparent high-grade stenosis within a mid to distal M2 right MCA branch vessel. The anterior cerebral arteries are patent. The intracranial vertebral arteries are patent. The basilar artery is patent. The posterior cerebral arteries are patent. Atherosclerotic irregularity of the P2 and P3 posterior cerebral arteries bilaterally  with sites of apparent high-grade stenosis. Posterior communicating arteries are hypoplastic or absent bilaterally. No intracranial aneurysm is identified. IMPRESSION: MRI brain: 1. No evidence of acute intracranial abnormality, including acute infarction. 2. Mild generalized cerebral atrophy. 3. Minimal ethmoid sinus mucosal thickening. 4. Trace bilateral mastoid effusions. MRA head: 1. No intracranial large vessel occlusion. 2. Intracranial atherosclerotic disease with multifocal stenoses, most notably as follows. 3. Moderate focal stenosis within a proximal superior division left M2 MCA branch vessel. 4. Apparent high-grade stenosis within a mid to distal M2 right MCA branch vessel. 5. Sites of high-grade stenosis within the P2 and P3 posterior cerebral arteries bilaterally. Electronically Signed   By: Kellie Simmering DO   On: 07/31/2020 08:04   MR BRAIN WO CONTRAST  Result Date: 07/31/2020 CLINICAL DATA:  Neuro deficit, acute, stroke suspected; right lower extremity weakness, COVID pneumonia. EXAM: MRI HEAD WITHOUT CONTRAST MRA HEAD WITHOUT CONTRAST TECHNIQUE: Multiplanar, multiecho pulse sequences of the brain and surrounding structures were obtained without intravenous contrast. Angiographic images of the head were obtained using MRA technique without contrast. COMPARISON:  Noncontrast head CT 07/30/2020. FINDINGS: MRI HEAD FINDINGS Brain: Mild generalized cerebral atrophy with associated prominence of ventricles and sulci. No significant white matter disease for age. There is no acute infarct. No evidence of intracranial mass. No chronic intracranial blood products. No extra-axial fluid collection. No midline shift. Vascular: Reported below. Skull and upper cervical spine: No focal marrow lesion. Sinuses/Orbits: Visualized orbits show no acute finding. Minimal ethmoid sinus mucosal thickening. Trace fluid within the bilateral mastoid air cells. MRA HEAD FINDINGS The intracranial internal carotid arteries  are patent. The M1 middle cerebral arteries are patent without significant stenosis. No M2 proximal branch occlusion. Atherosclerotic irregularity of M2 and more distal MCA branch vessels bilaterally. Most notably, there is a moderate focal stenosis within a proximal superior division left M2 MCA branch vessel. Apparent high-grade stenosis within a mid to distal M2 right MCA branch vessel. The anterior cerebral arteries are patent. The intracranial vertebral arteries are patent. The basilar artery is patent. The posterior cerebral arteries are patent. Atherosclerotic irregularity of the P2 and P3 posterior cerebral arteries bilaterally with sites of apparent high-grade stenosis. Posterior communicating arteries are hypoplastic or absent bilaterally. No intracranial aneurysm is identified. IMPRESSION: MRI brain: 1. No evidence of acute intracranial abnormality, including acute infarction. 2. Mild generalized cerebral atrophy. 3. Minimal ethmoid sinus mucosal thickening. 4. Trace bilateral mastoid effusions. MRA head: 1. No intracranial large vessel occlusion. 2. Intracranial atherosclerotic disease with multifocal stenoses, most notably as follows. 3. Moderate focal stenosis within a proximal superior division left M2 MCA branch vessel. 4. Apparent high-grade stenosis within a mid  to distal M2 right MCA branch vessel. 5. Sites of high-grade stenosis within the P2 and P3 posterior cerebral arteries bilaterally. Electronically Signed   By: Kellie Simmering DO   On: 07/31/2020 08:04   US Carotid Bilateral  Result Date: 07/31/2020 CLINICAL DATA:  74 year old female with stroke EXAM: BILATERAL CAROTID DUPLEX ULTRASOUND TECHNIQUE: Pearline Cables scale imaging, color Doppler and duplex ultrasound were performed of bilateral carotid and vertebral arteries in the neck. COMPARISON:  None. FINDINGS: Criteria: Quantification of carotid stenosis is based on velocity parameters that correlate the residual internal carotid diameter with  NASCET-based stenosis levels, using the diameter of the distal internal carotid lumen as the denominator for stenosis measurement. The following velocity measurements were obtained: RIGHT ICA:  Systolic 68 cm/sec, Diastolic 14 cm/sec CCA:  66 cm/sec SYSTOLIC ICA/CCA RATIO:  1.0 ECA:  141 cm/sec LEFT ICA:  Systolic 69 cm/sec, Diastolic 14 cm/sec CCA:  77 cm/sec SYSTOLIC ICA/CCA RATIO:  0.9 ECA:  73 cm/sec Right Brachial SBP: Not acquired Left Brachial SBP: Not acquired RIGHT CAROTID ARTERY: No significant calcifications of the right common carotid artery. Intermediate waveform maintained. Heterogeneous and partially calcified plaque at the right carotid bifurcation. No significant lumen shadowing. Low resistance waveform of the right ICA. No significant tortuosity. RIGHT VERTEBRAL ARTERY: Antegrade flow with low resistance waveform. LEFT CAROTID ARTERY: No significant calcifications of the left common carotid artery. Intermediate waveform maintained. Heterogeneous and partially calcified plaque at the left carotid bifurcation without significant lumen shadowing. Low resistance waveform of the left ICA. No significant tortuosity. LEFT VERTEBRAL ARTERY:  Antegrade flow with low resistance waveform. 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 Incidental right carotid body tumor, 2.3 cm Signed, Jaime S. Dellia Nims, RPVI [facility] Electronically Signed   By: Corrie Mckusick D.O.   On: 07/31/2020 14:35   DG Foot Complete Right  Result Date: 07/16/2020 Please see detailed radiograph report in office note.  ECHOCARDIOGRAM COMPLETE  Result Date: 07/31/2020    ECHOCARDIOGRAM REPORT   Patient Name:   [name] Date of Exam: 07/31/2020 Medical Rec #:   [id number]   Height:       60.0 in Accession #:    [id number]  Weight:       149.0 lb Date of Birth:  1945/11/06   BSA:          1.647 m Patient Age:    23 years    BP:           148/55 mmHg Patient Gender: F           HR:           58 bpm. Exam Location:  [facility] Procedure: 2D Echo Indications:    Stroke 434.91 / I163.9  History:        Patient has prior history of Echocardiogram examinations, most                 recent 03/05/2009. Risk Factors:Diabetes, Hypertension and                 Non-Smoker.  Sonographer:    Leavy Cella RDCS (AE) Referring Phys: [place]  1. Left ventricular ejection fraction, by estimation, is 55 to 60%. The left ventricle has normal function. The left ventricle has no regional wall  motion abnormalities. There is mild left ventricular hypertrophy. Left ventricular diastolic parameters are indeterminate.  2. Right ventricular systolic function is normal. The right ventricular size is normal. Tricuspid regurgitation signal is inadequate for assessing PA pressure.  3. The mitral valve is grossly normal. Mild mitral valve regurgitation.  4. The aortic valve is tricuspid. Aortic valve regurgitation is not visualized.  5. The inferior vena cava is normal in size with greater than 50% respiratory variability, suggesting right atrial pressure of 3 mmHg. FINDINGS  Left Ventricle: Left ventricular ejection fraction, by estimation, is 55 to 60%. The left ventricle has normal function. The left ventricle has no regional wall motion abnormalities. The left ventricular internal cavity size was normal in size. There is  mild left ventricular hypertrophy. Left ventricular diastolic parameters are indeterminate. Right Ventricle: The right ventricular size is normal. No increase in right ventricular wall thickness. Right ventricular systolic function is normal. Tricuspid regurgitation signal is inadequate for assessing PA pressure. Left Atrium: Left atrial size was normal  in size. Right Atrium: Right atrial size was normal in size. Pericardium: There is no evidence of pericardial effusion. Mitral Valve: The mitral valve is grossly normal. Mild mitral valve regurgitation. Tricuspid Valve: The tricuspid valve is grossly normal. Tricuspid valve regurgitation is trivial. Aortic Valve: The aortic valve is tricuspid. There is mild aortic valve annular calcification. Aortic valve regurgitation is not visualized. Pulmonic Valve: The pulmonic valve was grossly normal. Pulmonic valve regurgitation is trivial. Aorta: The aortic root is normal in size and structure. Venous: The inferior vena cava is normal in size with greater than 50% respiratory variability, suggesting right atrial pressure of 3 mmHg. IAS/Shunts: No atrial level shunt detected by color flow Doppler.  LEFT VENTRICLE PLAX 2D LVIDd:         5.02 cm  Diastology LVIDs:         2.84 cm  LV e' medial:    6.20 cm/s LV PW:         1.55 cm  LV E/e' medial:  11.0 LV IVS:        1.21 cm  LV e' lateral:   7.07 cm/s LVOT diam:     1.90 cm  LV E/e' lateral: 9.6 LVOT Area:     2.84 cm  RIGHT VENTRICLE RV S prime:     13.70 cm/s TAPSE (M-mode): 2.6 cm LEFT ATRIUM             Index       RIGHT ATRIUM           Index LA diam:        3.50 cm 2.12 cm/m  RA Area:     13.90 cm LA Vol (A2C):   48.2 ml 29.26 ml/m RA Volume:   34.00 ml  20.64 ml/m LA Vol (A4C):   31.6 ml 19.18 ml/m LA Biplane Vol: 39.1 ml 23.74 ml/m   AORTA Ao Root diam: 2.70 cm MITRAL VALVE MV Area (PHT): 3.00 cm    SHUNTS MV Decel Time: 253 msec    Systemic Diam: 1.90 cm MV E velocity: 68.10 cm/s MV A velocity: 64.70 cm/s MV E/A ratio:  1.05 Rozann Lesches MD Electronically signed by Rozann Lesches MD Signature Date/Time: 07/31/2020/12:00:49 PM    Final    [place] EKG SITE RITE  Result Date: 08/02/2020 If Site Rite image not attached, placement could not be confirmed due to current cardiac rhythm.    Microbiology: Recent Results (from the past 240 hour(s))  Respiratory Panel by RT PCR (Flu A&B, Covid) - Nasopharyngeal Swab     Status: Abnormal   Collection Time: 07/30/20  6:03 PM   Specimen: Nasopharyngeal Swab  Result Value Ref Range Status   SARS Coronavirus 2 by RT PCR POSITIVE (A) NEGATIVE Final    Comment: RESULT CALLED TO, READ BACK BY AND VERIFIED WITH: T EASTER,RN@[id number]  07/30/20 MKELLY (NOTE) SARS-CoV-2 target nucleic acids are DETECTED.  SARS-CoV-2 RNA is generally detectable in upper respiratory specimens  during the acute phase of infection. Positive results are indicative of the presence of the identified virus, but do not rule out bacterial infection or co-infection with other pathogens not detected by the test. Clinical correlation with patient history and other diagnostic information is necessary to determine patient infection status. The expected result is Negative.  Fact Sheet for Patients:  PinkCheek.be  Fact Sheet for Healthcare Providers: GravelBags.it  This test is not yet approved or cleared by the [place] FDA and  has been authorized for detection and/or diagnosis of SARS-CoV-2 by FDA under an Emergency Use Authorization (EUA).  This EUA will remain in effect (meaning this test can be [place] ed) for the duration of  the COVID-19 declaration under Section 564(b)(1) of the Act, 21 U.S.C. section 360bbb-3(b)(1), unless the authorization is terminated or revoked sooner.      Influenza A by PCR NEGATIVE NEGATIVE Final   Influenza B by PCR NEGATIVE NEGATIVE Final    Comment: (NOTE) The Xpert Xpress SARS-CoV-2/FLU/RSV assay is intended as an aid in  the diagnosis of influenza from Nasopharyngeal swab specimens and  should not be used as a sole basis for treatment. Nasal washings and  aspirates are unacceptable for Xpert Xpress SARS-CoV-2/FLU/RSV  testing.  Fact Sheet for Patients: PinkCheek.be  Fact Sheet for Healthcare  Providers: GravelBags.it  This test is not yet approved or cleared by the [place] FDA and  has been authorized for detection and/or diagnosis of SARS-CoV-2 by  FDA under an Emergency Use Authorization (EUA). This EUA will remain  in effect (meaning this test can be used) for the duration of the  Covid-19 declaration under Section 564(b)(1) of the Act, 21  U.S.C. section 360bbb-3(b)(1), unless the authorization is  terminated or revoked. Performed at [facility], [address], [place], [place] [address]   Blood Culture (routine x 2)     Status: None   Collection Time: 07/30/20  8:00 PM   Specimen: Left Antecubital; Blood  Result Value Ref Range Status   Specimen Description LEFT ANTECUBITAL  Final   Special Requests   Final    BOTTLES DRAWN AEROBIC AND ANAEROBIC Blood Culture adequate volume   Culture   Final    NO GROWTH 5 DAYS Performed at [facility], [address] [place], [place] [address]    Report Status 08/04/2020 FINAL  Final  Blood Culture (routine x 2)     Status: None   Collection Time: 07/30/20  8:23 PM   Specimen: Left Antecubital; Blood  Result Value Ref Range Status   Specimen Description LEFT ANTECUBITAL  Final   Special Requests   Final    BOTTLES DRAWN AEROBIC AND ANAEROBIC Blood Culture adequate volume   Culture   Final    NO GROWTH 5 DAYS Performed at [facility], [address] [place], [place] [address]    Report Status 08/04/2020 FINAL  Final     Labs: Basic Metabolic Panel: Recent Labs  Lab 07/30/20 1542 07/30/20 1542 07/31/20 0535 07/31/20 0535  08/01/20 7989 08/01/20 0651 08/03/20 0638 08/04/20 0639  NA 130*  --  132*  --  133*  --  134* 132*  K 3.3*   < > 3.7   < > 4.9   < > 4.1 4.3  CL 88*  --  93*  --  97*  --  96* 98  CO2 28  --  27  --  23  --  27 26  GLUCOSE 307*  --  314*  --  240*  --  140* 72  BUN 18  --  18  --  15  --  11 12  CREATININE 0.80  --  0.73  --  0.60  --  0.50 0.56  CALCIUM  8.4*  --  8.1*  --  8.2*  --  8.1* 8.2*  MG  --   --  2.1  --  2.1  --  1.8 1.8  PHOS  --   --  2.9  --  2.5  --  2.5 2.1*   < > = values in this interval not displayed.   Liver Function Tests: Recent Labs  Lab 07/30/20 1542 07/31/20 0535 08/01/20 0651 08/03/20 0638 08/04/20 0639  AST 68* 46* 48* 37 40  ALT 45* 33 31 33 38  ALKPHOS 40 32* 33* 35* 36*  BILITOT 0.8 0.8 0.7 1.0 1.0  PROT 6.8 5.6* 5.3* 5.3* 5.5*  ALBUMIN 3.4* 2.7* 2.7* 2.7* 2.9*   No results for input(s): LIPASE, AMYLASE in the last 168 hours. No results for input(s): AMMONIA in the last 168 hours. CBC: Recent Labs  Lab 07/30/20 1542 07/31/20 0535 08/01/20 0751 08/03/20 0638 08/04/20 0639  WBC 6.7 2.8* 4.7 8.7 11.2*  NEUTROABS 5.9 1.9 2.5 7.1 9.0*  HGB 12.9 11.5* 12.7 12.9 13.8  HCT 38.2 34.6* 38.8 39.5 42.4  MCV 89.0 89.4 92.8 91.4 89.8  PLT 277 250 238 338 364   Cardiac Enzymes: No results for input(s): CKTOTAL, CKMB, CKMBINDEX, TROPONINI in the last 168 hours. BNP: Invalid input(s): POCBNP CBG: Recent Labs  Lab 08/04/20 1136 08/04/20 1708 08/04/20 2036 08/05/20 0740 08/05/20 1118  GLUCAP 183* 230* 182* 82 142*    Time coordinating discharge:  36 minutes  Signed:  Orson Eva, DO [facility] Pager: [phone] 08/05/2020, 12:54 PM

## 2020-08-05 NOTE — Progress Notes (Signed)
Third attempted to give report to [facility]. This nurse left voicemail with name and number for the receiving nurse to call back. Will continue to attempt to call report to receiving facility.

## 2020-08-05 NOTE — TOC Transition Note (Signed)
Transition of Care [facility]) - CM/SW Discharge Note   Patient Details  Name: [name] MRN: [id number] Date of Birth: 1945-10-29  [facility] CM/SW Contact:  Natasha Bence, LCSW Phone Number: 08/05/2020, 1:35 PM   Clinical Narrative:    CSW received bed offer [facility] agree[facility] to take patient on 08/05/20. CSW confirmed with patient's family that they remain agreeable for [facility]. Patient's family reported that they were agreeable with [facility] SNF. CSW faxed d/c summary, completed med necessity, and called EMS. Nurse to call report. TOC signing off   Final next level of care: [place] Barriers to Discharge: Barriers Resolved   Patient Goals and CMS Choice Patient states their goals for this hospitalization and ongoing recovery are:: Rehab with SNF CMS Medicare.gov Compare Post Acute Care list provided to:: Patient Represenative (must comment) (Daughters) Choice offered to / list presented to : Patient, Adult Children  Discharge Placement   Existing PASRR number confirmed : 08/05/20          Patient chooses bed at: [facility] Patient to be transferred to facility by: [facility] Name of family member notified: Cathie Olden (Daughter) [phone] Patient and family notified of of transfer: 08/05/20  Discharge Plan and Services     Post Acute Care Choice: [facility]                               Social Determinants of Health (SDOH) Interventions     Readmission Risk Interventions Readmission Risk Prevention Plan 08/05/2020  Post Dischage Appt Complete  Medication Screening Complete  Transportation Screening Complete  Some recent data might be hidden

## 2020-08-05 NOTE — Progress Notes (Signed)
Report given to receiving nurse at [facility]. Social worker (Kendrick) placed this nurse and receiving nurse on a 3 way call. EMS transporting patient to facility. Discharge summary placed in packet for receiving facility

## 2020-08-05 NOTE — Plan of Care (Signed)
Problem: Education: Goal: Knowledge of General Education information will improve Description: Including pain rating scale, medication(s)/side effects and non-pharmacologic comfort measures 08/05/2020 0229 by Baldemar Friday, RN Outcome: Progressing 08/05/2020 0157 by Baldemar Friday, RN Outcome: Progressing   Problem: Health Behavior/Discharge Planning: Goal: Ability to manage health-related needs will improve 08/05/2020 0229 by Baldemar Friday, RN Outcome: Progressing 08/05/2020 0157 by Baldemar Friday, RN Outcome: Progressing   Problem: Clinical Measurements: Goal: Ability to maintain clinical measurements within normal limits will improve 08/05/2020 0229 by Baldemar Friday, RN Outcome: Progressing 08/05/2020 0157 by Baldemar Friday, RN Outcome: Progressing Goal: Will remain free from infection 08/05/2020 0229 by Baldemar Friday, RN Outcome: Progressing 08/05/2020 0157 by Baldemar Friday, RN Outcome: Progressing Goal: Diagnostic test results will improve 08/05/2020 0229 by Baldemar Friday, RN Outcome: Progressing 08/05/2020 0157 by Baldemar Friday, RN Outcome: Progressing Goal: Respiratory complications will improve 08/05/2020 0229 by Baldemar Friday, RN Outcome: Progressing 08/05/2020 0157 by Baldemar Friday, RN Outcome: Progressing Goal: Cardiovascular complication will be avoided 08/05/2020 0229 by Baldemar Friday, RN Outcome: Progressing 08/05/2020 0157 by Baldemar Friday, RN Outcome: Progressing   Problem: Activity: Goal: Risk for activity intolerance will decrease 08/05/2020 0229 by Baldemar Friday, RN Outcome: Progressing 08/05/2020 0157 by Baldemar Friday, RN Outcome: Progressing   Problem: Nutrition: Goal: Adequate nutrition will be maintained 08/05/2020 0229 by Baldemar Friday, RN Outcome: Progressing 08/05/2020 0157 by Baldemar Friday, RN Outcome: Progressing   Problem: Coping: Goal: Level of anxiety will decrease 08/05/2020 0229 by Baldemar Friday, RN Outcome:  Progressing 08/05/2020 0157 by Baldemar Friday, RN Outcome: Progressing   Problem: Elimination: Goal: Will not experience complications related to bowel motility 08/05/2020 0229 by Baldemar Friday, RN Outcome: Progressing 08/05/2020 0157 by Baldemar Friday, RN Outcome: Progressing Goal: Will not experience complications related to urinary retention 08/05/2020 0229 by Baldemar Friday, RN Outcome: Progressing 08/05/2020 0157 by Baldemar Friday, RN Outcome: Progressing   Problem: Pain Managment: Goal: General experience of comfort will improve 08/05/2020 0229 by Baldemar Friday, RN Outcome: Progressing 08/05/2020 0157 by Baldemar Friday, RN Outcome: Progressing   Problem: Safety: Goal: Ability to remain free from injury will improve 08/05/2020 0229 by Baldemar Friday, RN Outcome: Progressing 08/05/2020 0157 by Baldemar Friday, RN Outcome: Progressing   Problem: Skin Integrity: Goal: Risk for impaired skin integrity will decrease 08/05/2020 0229 by Baldemar Friday, RN Outcome: Progressing 08/05/2020 0157 by Baldemar Friday, RN Outcome: Progressing   Problem: Education: Goal: Knowledge of risk factors and measures for prevention of condition will improve 08/05/2020 0229 by Baldemar Friday, RN Outcome: Progressing 08/05/2020 0157 by Baldemar Friday, RN Outcome: Progressing   Problem: Coping: Goal: Psychosocial and spiritual needs will be supported 08/05/2020 0229 by Baldemar Friday, RN Outcome: Progressing 08/05/2020 0157 by Baldemar Friday, RN Outcome: Progressing   Problem: Respiratory: Goal: Will maintain a patent airway 08/05/2020 0229 by Baldemar Friday, RN Outcome: Progressing 08/05/2020 0157 by Baldemar Friday, RN Outcome: Progressing Goal: Complications related to the disease process, condition or treatment will be avoided or minimized 08/05/2020 0229 by Baldemar Friday, RN Outcome: Progressing 08/05/2020 0157 by Baldemar Friday, RN Outcome: Progressing   Problem:  Education: Goal: Knowledge of risk factors and measures for prevention of condition will improve 08/05/2020 0229 by Baldemar Friday, RN Outcome: Progressing 08/05/2020 0157 by Baldemar Friday, RN Outcome: Progressing   Problem: Coping: Goal: Psychosocial and spiritual needs will be supported 08/05/2020 0229 by Baldemar Friday, RN Outcome: Progressing 08/05/2020 0157 by Baldemar Friday, RN Outcome: Progressing   Problem: Respiratory: Goal: Will maintain a patent airway 08/05/2020 0229 by Youlanda Mighty,  [place], RN Outcome: Progressing 08/05/2020 0157 by Baldemar Friday, RN Outcome: Progressing Goal: Complications related to the disease process, condition or treatment will be avoided or minimized 08/05/2020 0229 by Baldemar Friday, RN Outcome: Progressing 08/05/2020 0157 by Baldemar Friday, RN Outcome: Progressing

## 2020-08-05 NOTE — Care Management Important Message (Signed)
Important Message  Patient Details  Name: [name] MRN: [id number] Date of Birth: [birth year]   Medicare Important Message Given:  Yes (RN, Rachael was asked to deliver letter to patient room)     Tommy Medal 08/05/2020, 1:35 PM

## 2020-08-05 NOTE — Plan of Care (Signed)
  Problem: Education: °Goal: Knowledge of General Education information will improve °Description: Including pain rating scale, medication(s)/side effects and non-pharmacologic comfort measures °Outcome: Progressing °  °Problem: Health Behavior/Discharge Planning: °Goal: Ability to manage health-related needs will improve °Outcome: Progressing °  °Problem: Clinical Measurements: °Goal: Ability to maintain clinical measurements within normal limits will improve °Outcome: Progressing °Goal: Will remain free from infection °Outcome: Progressing °Goal: Diagnostic test results will improve °Outcome: Progressing °Goal: Respiratory complications will improve °Outcome: Progressing °Goal: Cardiovascular complication will be avoided °Outcome: Progressing °  °Problem: Activity: °Goal: Risk for activity intolerance will decrease °Outcome: Progressing °  °Problem: Nutrition: °Goal: Adequate nutrition will be maintained °Outcome: Progressing °  °Problem: Coping: °Goal: Level of anxiety will decrease °Outcome: Progressing °  °Problem: Elimination: °Goal: Will not experience complications related to bowel motility °Outcome: Progressing °Goal: Will not experience complications related to urinary retention °Outcome: Progressing °  °Problem: Pain Managment: °Goal: General experience of comfort will improve °Outcome: Progressing °  °Problem: Safety: °Goal: Ability to remain free from injury will improve °Outcome: Progressing °  °Problem: Skin Integrity: °Goal: Risk for impaired skin integrity will decrease °Outcome: Progressing °  °Problem: Education: °Goal: Knowledge of risk factors and measures for prevention of condition will improve °Outcome: Progressing °  °Problem: Coping: °Goal: Psychosocial and spiritual needs will be supported °Outcome: Progressing °  °Problem: Respiratory: °Goal: Will maintain a patent airway °Outcome: Progressing °Goal: Complications related to the disease process, condition or treatment will be avoided or  minimized °Outcome: Progressing °  °Problem: Education: °Goal: Knowledge of risk factors and measures for prevention of condition will improve °Outcome: Progressing °  °Problem: Coping: °Goal: Psychosocial and spiritual needs will be supported °Outcome: Progressing °  °Problem: Respiratory: °Goal: Will maintain a patent airway °Outcome: Progressing °Goal: Complications related to the disease process, condition or treatment will be avoided or minimized °Outcome: Progressing °  °

## 2020-08-05 NOTE — Progress Notes (Signed)
Inpatient Diabetes Program Recommendations  AACE/ADA: New Consensus Statement on Inpatient Glycemic Control (2015)  Target Ranges:  Prepandial:   less than 140 mg/dL      Peak postprandial:   less than 180 mg/dL (1-2 hours)      Critically ill patients:  140 - 180 mg/dL   Results for GILBERT, NARAIN (MRN [id number]) as of 08/05/2020 08:53  Ref. Range 08/04/2020 08:18 08/04/2020 11:36 08/04/2020 17:08 08/04/2020 20:36  Glucose-Capillary Latest Ref Range: 70 - 99 mg/dL 105 (H)  4 units NOVOLOG +  20 units LANTUS  183 (H)  8 units NOVOLOG  230 (H)  11 units NOVOLOG  182 (H)   Results for JACKQULYN, MENDEL (MRN [id number]) as of 08/05/2020 08:53  Ref. Range 08/05/2020 07:40  Glucose-Capillary Latest Ref Range: 70 - 99 mg/dL 82    Home DM Meds: Amaryl 4 mg QD       Metformin 500 mg BID       Alogliptin 12.5 mg Daily  Current Orders: Lantus 20 units Daily       Novolog 0-20 units ac/hs       Novolog 4 units TID with meals      Tradjenta 5 mg Daily     Solumedrol 80 mg BID    MD- Note CBG only 82 this AM  May consider reducing Lantus to 15 units Daily    --Will follow patient during hospitalization--  Wyn Quaker RN, MSN, CDE Diabetes Coordinator Inpatient Glycemic Control Team Team Pager: [phone] (8a-5p)

## 2020-08-06 DIAGNOSIS — F329 Major depressive disorder, single episode, unspecified: Secondary | ICD-10-CM | POA: Diagnosis not present

## 2020-08-06 DIAGNOSIS — G929 Unspecified toxic encephalopathy: Secondary | ICD-10-CM | POA: Diagnosis not present

## 2020-08-06 DIAGNOSIS — E119 Type 2 diabetes mellitus without complications: Secondary | ICD-10-CM | POA: Diagnosis not present

## 2020-08-06 DIAGNOSIS — J9601 Acute respiratory failure with hypoxia: Secondary | ICD-10-CM | POA: Diagnosis not present

## 2020-08-06 DIAGNOSIS — U071 COVID-19: Secondary | ICD-10-CM | POA: Diagnosis not present

## 2020-08-06 DIAGNOSIS — M6281 Muscle weakness (generalized): Secondary | ICD-10-CM | POA: Diagnosis not present

## 2020-08-06 DIAGNOSIS — G9341 Metabolic encephalopathy: Secondary | ICD-10-CM | POA: Diagnosis not present

## 2020-08-06 DIAGNOSIS — J1282 Pneumonia due to coronavirus disease 2019: Secondary | ICD-10-CM | POA: Diagnosis not present

## 2020-08-06 DIAGNOSIS — K219 Gastro-esophageal reflux disease without esophagitis: Secondary | ICD-10-CM | POA: Diagnosis not present

## 2020-08-06 DIAGNOSIS — E782 Mixed hyperlipidemia: Secondary | ICD-10-CM | POA: Diagnosis not present

## 2020-08-06 DIAGNOSIS — J9600 Acute respiratory failure, unspecified whether with hypoxia or hypercapnia: Secondary | ICD-10-CM | POA: Diagnosis not present

## 2020-08-06 DIAGNOSIS — R2681 Unsteadiness on feet: Secondary | ICD-10-CM | POA: Diagnosis not present

## 2020-08-06 DIAGNOSIS — I10 Essential (primary) hypertension: Secondary | ICD-10-CM | POA: Diagnosis not present

## 2020-08-07 DIAGNOSIS — F329 Major depressive disorder, single episode, unspecified: Secondary | ICD-10-CM | POA: Diagnosis not present

## 2020-08-07 DIAGNOSIS — K219 Gastro-esophageal reflux disease without esophagitis: Secondary | ICD-10-CM | POA: Diagnosis not present

## 2020-08-07 DIAGNOSIS — M6281 Muscle weakness (generalized): Secondary | ICD-10-CM | POA: Diagnosis not present

## 2020-08-07 DIAGNOSIS — E119 Type 2 diabetes mellitus without complications: Secondary | ICD-10-CM | POA: Diagnosis not present

## 2020-08-07 DIAGNOSIS — E782 Mixed hyperlipidemia: Secondary | ICD-10-CM | POA: Diagnosis not present

## 2020-08-07 DIAGNOSIS — J9600 Acute respiratory failure, unspecified whether with hypoxia or hypercapnia: Secondary | ICD-10-CM | POA: Diagnosis not present

## 2020-08-07 DIAGNOSIS — R2681 Unsteadiness on feet: Secondary | ICD-10-CM | POA: Diagnosis not present

## 2020-08-07 DIAGNOSIS — G9341 Metabolic encephalopathy: Secondary | ICD-10-CM | POA: Diagnosis not present

## 2020-08-07 DIAGNOSIS — I10 Essential (primary) hypertension: Secondary | ICD-10-CM | POA: Diagnosis not present

## 2020-08-07 DIAGNOSIS — U071 COVID-19: Secondary | ICD-10-CM | POA: Diagnosis not present

## 2020-08-08 DIAGNOSIS — J1282 Pneumonia due to coronavirus disease 2019: Secondary | ICD-10-CM | POA: Diagnosis not present

## 2020-08-08 DIAGNOSIS — U071 COVID-19: Secondary | ICD-10-CM | POA: Diagnosis not present

## 2020-08-08 DIAGNOSIS — I10 Essential (primary) hypertension: Secondary | ICD-10-CM | POA: Diagnosis not present

## 2020-08-08 DIAGNOSIS — E878 Other disorders of electrolyte and fluid balance, not elsewhere classified: Secondary | ICD-10-CM | POA: Diagnosis not present

## 2020-08-09 DIAGNOSIS — M6281 Muscle weakness (generalized): Secondary | ICD-10-CM | POA: Diagnosis not present

## 2020-08-09 DIAGNOSIS — I10 Essential (primary) hypertension: Secondary | ICD-10-CM | POA: Diagnosis not present

## 2020-08-09 DIAGNOSIS — K219 Gastro-esophageal reflux disease without esophagitis: Secondary | ICD-10-CM | POA: Diagnosis not present

## 2020-08-09 DIAGNOSIS — U071 COVID-19: Secondary | ICD-10-CM | POA: Diagnosis not present

## 2020-08-09 DIAGNOSIS — E782 Mixed hyperlipidemia: Secondary | ICD-10-CM | POA: Diagnosis not present

## 2020-08-09 DIAGNOSIS — R2681 Unsteadiness on feet: Secondary | ICD-10-CM | POA: Diagnosis not present

## 2020-08-09 DIAGNOSIS — E119 Type 2 diabetes mellitus without complications: Secondary | ICD-10-CM | POA: Diagnosis not present

## 2020-08-09 DIAGNOSIS — J9600 Acute respiratory failure, unspecified whether with hypoxia or hypercapnia: Secondary | ICD-10-CM | POA: Diagnosis not present

## 2020-08-09 DIAGNOSIS — G9341 Metabolic encephalopathy: Secondary | ICD-10-CM | POA: Diagnosis not present

## 2020-08-09 DIAGNOSIS — F329 Major depressive disorder, single episode, unspecified: Secondary | ICD-10-CM | POA: Diagnosis not present

## 2020-08-10 DIAGNOSIS — E119 Type 2 diabetes mellitus without complications: Secondary | ICD-10-CM | POA: Diagnosis not present

## 2020-08-10 DIAGNOSIS — I10 Essential (primary) hypertension: Secondary | ICD-10-CM | POA: Diagnosis not present

## 2020-08-10 DIAGNOSIS — E7849 Other hyperlipidemia: Secondary | ICD-10-CM | POA: Diagnosis not present

## 2020-08-10 DIAGNOSIS — N181 Chronic kidney disease, stage 1: Secondary | ICD-10-CM | POA: Diagnosis not present

## 2020-08-12 DIAGNOSIS — J1282 Pneumonia due to coronavirus disease 2019: Secondary | ICD-10-CM | POA: Diagnosis not present

## 2020-08-12 DIAGNOSIS — J9601 Acute respiratory failure with hypoxia: Secondary | ICD-10-CM | POA: Diagnosis not present

## 2020-08-12 DIAGNOSIS — G929 Unspecified toxic encephalopathy: Secondary | ICD-10-CM | POA: Diagnosis not present

## 2020-08-12 DIAGNOSIS — U071 COVID-19: Secondary | ICD-10-CM | POA: Diagnosis not present

## 2020-08-13 ENCOUNTER — Other Ambulatory Visit: Payer: Self-pay | Admitting: *Deleted

## 2020-08-13 DIAGNOSIS — F329 Major depressive disorder, single episode, unspecified: Secondary | ICD-10-CM | POA: Diagnosis not present

## 2020-08-13 DIAGNOSIS — I10 Essential (primary) hypertension: Secondary | ICD-10-CM | POA: Diagnosis not present

## 2020-08-13 DIAGNOSIS — E119 Type 2 diabetes mellitus without complications: Secondary | ICD-10-CM | POA: Diagnosis not present

## 2020-08-13 DIAGNOSIS — R2681 Unsteadiness on feet: Secondary | ICD-10-CM | POA: Diagnosis not present

## 2020-08-13 DIAGNOSIS — K219 Gastro-esophageal reflux disease without esophagitis: Secondary | ICD-10-CM | POA: Diagnosis not present

## 2020-08-13 DIAGNOSIS — E782 Mixed hyperlipidemia: Secondary | ICD-10-CM | POA: Diagnosis not present

## 2020-08-13 DIAGNOSIS — J9600 Acute respiratory failure, unspecified whether with hypoxia or hypercapnia: Secondary | ICD-10-CM | POA: Diagnosis not present

## 2020-08-13 DIAGNOSIS — M6281 Muscle weakness (generalized): Secondary | ICD-10-CM | POA: Diagnosis not present

## 2020-08-13 DIAGNOSIS — U071 COVID-19: Secondary | ICD-10-CM | POA: Diagnosis not present

## 2020-08-13 DIAGNOSIS — G9341 Metabolic encephalopathy: Secondary | ICD-10-CM | POA: Diagnosis not present

## 2020-08-13 NOTE — Patient Outreach (Signed)
Member screened for potential [facility] Care Management needs as a benefit of [place] Medicare.  Per Patient [name] member resides in [facility].   Communication sent to [facility] SWs to collaborate about anticipated dc plans and potential [facility] Care Management needs.  Will continue to follow while member resides in SNF.   Marthenia Rolling, MSN-Ed, RN,BSN [place] Acute Care Coordinator [phone] [facility]) [phone]  (Toll free office)

## 2020-08-14 ENCOUNTER — Other Ambulatory Visit: Payer: Self-pay | Admitting: *Deleted

## 2020-08-14 NOTE — Patient Outreach (Signed)
[facility] Post- Acute Care Coordinator follow up. Member screened for potential [facility] Care Management needs as a benefit of [place] Medicare.  Update received from [facility] indicating member continues to receive therapy.  Will continue to follow transition plans and for potential [facility] Care Management needs.   Marthenia Rolling, MSN-Ed, RN,BSN [place] Acute Care Coordinator [phone] [facility] [phone]  (Toll free office)

## 2020-08-16 DIAGNOSIS — G9341 Metabolic encephalopathy: Secondary | ICD-10-CM | POA: Diagnosis not present

## 2020-08-16 DIAGNOSIS — J1282 Pneumonia due to coronavirus disease 2019: Secondary | ICD-10-CM | POA: Diagnosis not present

## 2020-08-16 DIAGNOSIS — M6281 Muscle weakness (generalized): Secondary | ICD-10-CM | POA: Diagnosis not present

## 2020-08-16 DIAGNOSIS — J9601 Acute respiratory failure with hypoxia: Secondary | ICD-10-CM | POA: Diagnosis not present

## 2020-08-16 DIAGNOSIS — U071 COVID-19: Secondary | ICD-10-CM | POA: Diagnosis not present

## 2020-08-16 DIAGNOSIS — E782 Mixed hyperlipidemia: Secondary | ICD-10-CM | POA: Diagnosis not present

## 2020-08-16 DIAGNOSIS — E118 Type 2 diabetes mellitus with unspecified complications: Secondary | ICD-10-CM | POA: Diagnosis not present

## 2020-08-16 DIAGNOSIS — R2681 Unsteadiness on feet: Secondary | ICD-10-CM | POA: Diagnosis not present

## 2020-08-16 DIAGNOSIS — K219 Gastro-esophageal reflux disease without esophagitis: Secondary | ICD-10-CM | POA: Diagnosis not present

## 2020-08-16 DIAGNOSIS — J9600 Acute respiratory failure, unspecified whether with hypoxia or hypercapnia: Secondary | ICD-10-CM | POA: Diagnosis not present

## 2020-08-16 DIAGNOSIS — F329 Major depressive disorder, single episode, unspecified: Secondary | ICD-10-CM | POA: Diagnosis not present

## 2020-08-16 DIAGNOSIS — E119 Type 2 diabetes mellitus without complications: Secondary | ICD-10-CM | POA: Diagnosis not present

## 2020-08-16 DIAGNOSIS — I10 Essential (primary) hypertension: Secondary | ICD-10-CM | POA: Diagnosis not present

## 2020-08-19 DIAGNOSIS — F331 Major depressive disorder, recurrent, moderate: Secondary | ICD-10-CM | POA: Diagnosis not present

## 2020-08-19 DIAGNOSIS — G9341 Metabolic encephalopathy: Secondary | ICD-10-CM | POA: Diagnosis not present

## 2020-08-19 DIAGNOSIS — F411 Generalized anxiety disorder: Secondary | ICD-10-CM | POA: Diagnosis not present

## 2020-08-20 DIAGNOSIS — G9341 Metabolic encephalopathy: Secondary | ICD-10-CM | POA: Diagnosis not present

## 2020-08-20 DIAGNOSIS — J9600 Acute respiratory failure, unspecified whether with hypoxia or hypercapnia: Secondary | ICD-10-CM | POA: Diagnosis not present

## 2020-08-20 DIAGNOSIS — E782 Mixed hyperlipidemia: Secondary | ICD-10-CM | POA: Diagnosis not present

## 2020-08-20 DIAGNOSIS — K219 Gastro-esophageal reflux disease without esophagitis: Secondary | ICD-10-CM | POA: Diagnosis not present

## 2020-08-20 DIAGNOSIS — E119 Type 2 diabetes mellitus without complications: Secondary | ICD-10-CM | POA: Diagnosis not present

## 2020-08-20 DIAGNOSIS — U071 COVID-19: Secondary | ICD-10-CM | POA: Diagnosis not present

## 2020-08-20 DIAGNOSIS — M6281 Muscle weakness (generalized): Secondary | ICD-10-CM | POA: Diagnosis not present

## 2020-08-20 DIAGNOSIS — I10 Essential (primary) hypertension: Secondary | ICD-10-CM | POA: Diagnosis not present

## 2020-08-20 DIAGNOSIS — R2681 Unsteadiness on feet: Secondary | ICD-10-CM | POA: Diagnosis not present

## 2020-08-20 DIAGNOSIS — F329 Major depressive disorder, single episode, unspecified: Secondary | ICD-10-CM | POA: Diagnosis not present

## 2020-08-21 DIAGNOSIS — J849 Interstitial pulmonary disease, unspecified: Secondary | ICD-10-CM | POA: Diagnosis not present

## 2020-08-21 DIAGNOSIS — E118 Type 2 diabetes mellitus with unspecified complications: Secondary | ICD-10-CM | POA: Diagnosis not present

## 2020-08-21 DIAGNOSIS — J9601 Acute respiratory failure with hypoxia: Secondary | ICD-10-CM | POA: Diagnosis not present

## 2020-08-22 DIAGNOSIS — R2681 Unsteadiness on feet: Secondary | ICD-10-CM | POA: Diagnosis not present

## 2020-08-22 DIAGNOSIS — E782 Mixed hyperlipidemia: Secondary | ICD-10-CM | POA: Diagnosis not present

## 2020-08-22 DIAGNOSIS — G9341 Metabolic encephalopathy: Secondary | ICD-10-CM | POA: Diagnosis not present

## 2020-08-22 DIAGNOSIS — J9600 Acute respiratory failure, unspecified whether with hypoxia or hypercapnia: Secondary | ICD-10-CM | POA: Diagnosis not present

## 2020-08-22 DIAGNOSIS — I10 Essential (primary) hypertension: Secondary | ICD-10-CM | POA: Diagnosis not present

## 2020-08-22 DIAGNOSIS — E119 Type 2 diabetes mellitus without complications: Secondary | ICD-10-CM | POA: Diagnosis not present

## 2020-08-22 DIAGNOSIS — F329 Major depressive disorder, single episode, unspecified: Secondary | ICD-10-CM | POA: Diagnosis not present

## 2020-08-22 DIAGNOSIS — K219 Gastro-esophageal reflux disease without esophagitis: Secondary | ICD-10-CM | POA: Diagnosis not present

## 2020-08-22 DIAGNOSIS — M6281 Muscle weakness (generalized): Secondary | ICD-10-CM | POA: Diagnosis not present

## 2020-08-22 DIAGNOSIS — U071 COVID-19: Secondary | ICD-10-CM | POA: Diagnosis not present

## 2020-08-23 DIAGNOSIS — J1282 Pneumonia due to coronavirus disease 2019: Secondary | ICD-10-CM | POA: Diagnosis not present

## 2020-08-23 DIAGNOSIS — I10 Essential (primary) hypertension: Secondary | ICD-10-CM | POA: Diagnosis not present

## 2020-08-23 DIAGNOSIS — J9610 Chronic respiratory failure, unspecified whether with hypoxia or hypercapnia: Secondary | ICD-10-CM | POA: Diagnosis not present

## 2020-08-23 DIAGNOSIS — U071 COVID-19: Secondary | ICD-10-CM | POA: Diagnosis not present

## 2020-08-26 DIAGNOSIS — E785 Hyperlipidemia, unspecified: Secondary | ICD-10-CM | POA: Diagnosis not present

## 2020-08-26 DIAGNOSIS — J1282 Pneumonia due to coronavirus disease 2019: Secondary | ICD-10-CM | POA: Diagnosis not present

## 2020-08-26 DIAGNOSIS — E7800 Pure hypercholesterolemia, unspecified: Secondary | ICD-10-CM | POA: Diagnosis not present

## 2020-08-26 DIAGNOSIS — Z7982 Long term (current) use of aspirin: Secondary | ICD-10-CM | POA: Diagnosis not present

## 2020-08-26 DIAGNOSIS — F329 Major depressive disorder, single episode, unspecified: Secondary | ICD-10-CM | POA: Diagnosis not present

## 2020-08-26 DIAGNOSIS — F419 Anxiety disorder, unspecified: Secondary | ICD-10-CM | POA: Diagnosis not present

## 2020-08-26 DIAGNOSIS — K5790 Diverticulosis of intestine, part unspecified, without perforation or abscess without bleeding: Secondary | ICD-10-CM | POA: Diagnosis not present

## 2020-08-26 DIAGNOSIS — U071 COVID-19: Secondary | ICD-10-CM | POA: Diagnosis not present

## 2020-08-26 DIAGNOSIS — E119 Type 2 diabetes mellitus without complications: Secondary | ICD-10-CM | POA: Diagnosis not present

## 2020-08-26 DIAGNOSIS — I69328 Other speech and language deficits following cerebral infarction: Secondary | ICD-10-CM | POA: Diagnosis not present

## 2020-08-26 DIAGNOSIS — Z9181 History of falling: Secondary | ICD-10-CM | POA: Diagnosis not present

## 2020-08-26 DIAGNOSIS — R1312 Dysphagia, oropharyngeal phase: Secondary | ICD-10-CM | POA: Diagnosis not present

## 2020-08-26 DIAGNOSIS — Z7984 Long term (current) use of oral hypoglycemic drugs: Secondary | ICD-10-CM | POA: Diagnosis not present

## 2020-08-26 DIAGNOSIS — J9601 Acute respiratory failure with hypoxia: Secondary | ICD-10-CM | POA: Diagnosis not present

## 2020-08-26 DIAGNOSIS — K2100 Gastro-esophageal reflux disease with esophagitis, without bleeding: Secondary | ICD-10-CM | POA: Diagnosis not present

## 2020-08-26 DIAGNOSIS — I10 Essential (primary) hypertension: Secondary | ICD-10-CM | POA: Diagnosis not present

## 2020-08-26 DIAGNOSIS — E039 Hypothyroidism, unspecified: Secondary | ICD-10-CM | POA: Diagnosis not present

## 2020-08-27 DIAGNOSIS — U071 COVID-19: Secondary | ICD-10-CM | POA: Diagnosis not present

## 2020-08-27 DIAGNOSIS — R1312 Dysphagia, oropharyngeal phase: Secondary | ICD-10-CM | POA: Diagnosis not present

## 2020-08-27 DIAGNOSIS — I10 Essential (primary) hypertension: Secondary | ICD-10-CM | POA: Diagnosis not present

## 2020-08-27 DIAGNOSIS — J9601 Acute respiratory failure with hypoxia: Secondary | ICD-10-CM | POA: Diagnosis not present

## 2020-08-27 DIAGNOSIS — E119 Type 2 diabetes mellitus without complications: Secondary | ICD-10-CM | POA: Diagnosis not present

## 2020-08-27 DIAGNOSIS — J1282 Pneumonia due to coronavirus disease 2019: Secondary | ICD-10-CM | POA: Diagnosis not present

## 2020-08-28 DIAGNOSIS — J1282 Pneumonia due to coronavirus disease 2019: Secondary | ICD-10-CM | POA: Diagnosis not present

## 2020-08-28 DIAGNOSIS — J9610 Chronic respiratory failure, unspecified whether with hypoxia or hypercapnia: Secondary | ICD-10-CM | POA: Diagnosis not present

## 2020-08-28 DIAGNOSIS — R0902 Hypoxemia: Secondary | ICD-10-CM | POA: Diagnosis not present

## 2020-08-28 DIAGNOSIS — R1312 Dysphagia, oropharyngeal phase: Secondary | ICD-10-CM | POA: Diagnosis not present

## 2020-08-28 DIAGNOSIS — M1991 Primary osteoarthritis, unspecified site: Secondary | ICD-10-CM | POA: Diagnosis not present

## 2020-08-28 DIAGNOSIS — E1129 Type 2 diabetes mellitus with other diabetic kidney complication: Secondary | ICD-10-CM | POA: Diagnosis not present

## 2020-08-28 DIAGNOSIS — E663 Overweight: Secondary | ICD-10-CM | POA: Diagnosis not present

## 2020-08-28 DIAGNOSIS — E119 Type 2 diabetes mellitus without complications: Secondary | ICD-10-CM | POA: Diagnosis not present

## 2020-08-28 DIAGNOSIS — I10 Essential (primary) hypertension: Secondary | ICD-10-CM | POA: Diagnosis not present

## 2020-08-28 DIAGNOSIS — Z6827 Body mass index (BMI) 27.0-27.9, adult: Secondary | ICD-10-CM | POA: Diagnosis not present

## 2020-08-28 DIAGNOSIS — U071 COVID-19: Secondary | ICD-10-CM | POA: Diagnosis not present

## 2020-08-28 DIAGNOSIS — J9601 Acute respiratory failure with hypoxia: Secondary | ICD-10-CM | POA: Diagnosis not present

## 2020-08-29 ENCOUNTER — Other Ambulatory Visit: Payer: Self-pay | Admitting: *Deleted

## 2020-08-29 ENCOUNTER — Other Ambulatory Visit (HOSPITAL_COMMUNITY): Payer: Self-pay | Admitting: Internal Medicine

## 2020-08-29 DIAGNOSIS — U071 COVID-19: Secondary | ICD-10-CM | POA: Diagnosis not present

## 2020-08-29 DIAGNOSIS — J9601 Acute respiratory failure with hypoxia: Secondary | ICD-10-CM | POA: Diagnosis not present

## 2020-08-29 DIAGNOSIS — E119 Type 2 diabetes mellitus without complications: Secondary | ICD-10-CM | POA: Diagnosis not present

## 2020-08-29 DIAGNOSIS — R1312 Dysphagia, oropharyngeal phase: Secondary | ICD-10-CM | POA: Diagnosis not present

## 2020-08-29 DIAGNOSIS — R053 Chronic cough: Secondary | ICD-10-CM

## 2020-08-29 DIAGNOSIS — J1282 Pneumonia due to coronavirus disease 2019: Secondary | ICD-10-CM | POA: Diagnosis not present

## 2020-08-29 DIAGNOSIS — I10 Essential (primary) hypertension: Secondary | ICD-10-CM | POA: Diagnosis not present

## 2020-08-29 NOTE — Patient Outreach (Addendum)
THN Post- Acute Care Coordinator follow up. Member screened for potential [facility] Care Management needs as a benefit of [place] [employer].  Verified in Patient [name] that member transitioned from [facility] to home on 08/23/20.  Telephone call made to Ms. [name] [phone]. Daughter [name] answers the phone. States member is unavailable. Patient identifiers confirmed. Krystle reports [name] is doing a lot better since she has been home from SNF. States [facility] has been out. [name] had PCP appointment on yesterday. States blood sugars are running 71-133 now rather than 300's like in SNF.   [name] confirms that she live with member. Denies any concerns with transportation or meals. States [name] manages her own medications.   Explained [place] Management services and [name] states member could benefit from the additional follow up. [name] states member is on oxygen thru [employer]. [name] also states she herself is recovering from [place] and has pneumonia.  [name] goes on to request that Bernice explain the reason for follow up calls again. [name] states she can be forgetful at times due to recent COVID.   Will make referral to [place] for care coordination. [name] has medical history of recent COVID, DM, hgb A1C 8.7, HTN, hypothyroidism, diverticulosis.   Marthenia Rolling, MSN, RN,BSN [place] Acute Care Coordinator [phone] [facility]) [phone]  (Toll free office)

## 2020-09-02 DIAGNOSIS — U071 COVID-19: Secondary | ICD-10-CM | POA: Diagnosis not present

## 2020-09-02 DIAGNOSIS — R1312 Dysphagia, oropharyngeal phase: Secondary | ICD-10-CM | POA: Diagnosis not present

## 2020-09-02 DIAGNOSIS — J1282 Pneumonia due to coronavirus disease 2019: Secondary | ICD-10-CM | POA: Diagnosis not present

## 2020-09-02 DIAGNOSIS — I10 Essential (primary) hypertension: Secondary | ICD-10-CM | POA: Diagnosis not present

## 2020-09-02 DIAGNOSIS — E119 Type 2 diabetes mellitus without complications: Secondary | ICD-10-CM | POA: Diagnosis not present

## 2020-09-02 DIAGNOSIS — J9601 Acute respiratory failure with hypoxia: Secondary | ICD-10-CM | POA: Diagnosis not present

## 2020-09-03 ENCOUNTER — Other Ambulatory Visit: Payer: Self-pay | Admitting: *Deleted

## 2020-09-03 NOTE — Patient Outreach (Signed)
[name]) Care Management  09/03/2020  [name] [birth year] [id number]   [facility] Transition of Care Referral   Referral Date: 08/30/20 Referral Source: Emma Rolling, MSN, RN,BSN-THN Post Acute Care Coordinator Referral reason Please assign to [facility] RN CM for care coordination. Member transitioned from [facility] on 08/23/20 to home. Has [facility]. Lives with daughter [name] who is agreeable to home health.    Last admission 07/30/20- 08/05/20- weakness, confusion - acute respiratory failure with hypoxia due to COVID 19 pneumonia, Diabetes (DM) type 2  HgA1c 8.7 on 07/30/20 Insurance: [employer]   Outreach attempt # 1 successful  Patient is able to verify Patient is able to verify HIPAA ([place]) identifiers, date of birth (DOB) and address Reviewed and addressed Transitional of care referral with patient  Consent: THN ([place]) RN CM reviewed [facility] services with patient. Patient gave verbal consent for services.   Initial assessment  [name] lives at home with her daughter, [name] She has a ramp at her mobile home She reports she is improving  She has [name] therapist visits twice a week  Appetite good  Oxygen tanks are heavy and she is having to take 3 tanks when she goes out  Saw fusco today 09/03/20 and [name] Her sodium was low and her Lasix was decreased   DME eye glasses ramp 2 wheel [name] walker bedside commode, shower seat grab bars in tub/shower $ L Oxygen    Past Medical History:  Diagnosis Date  . Diabetes mellitus without complication ([place])   . High cholesterol   . Hypertension      Medications: denies concerns with taking medications as prescribed, affording medications, side effects of medications and questions about medications    Consent: THN RN CM reviewed [facility] services with patient. Patient gave verbal consent for services. Advised patient that  other post discharge calls may occur to assess how the patient is doing following the recent hospitalization. Patient voiced understanding and was appreciative of f/u call.  Plan: [facility] RN CM will follow up with Emma Walen in 14-21 business days Pt encouraged to return a call to [facility] RN CM prn   Emma Millin L. Lavina Hamman, RN, BSN, [place] Management Care Coordinator Direct Number [phone] Mobile number [phone]  Main THN number [phone] Fax number [phone]

## 2020-09-04 DIAGNOSIS — E871 Hypo-osmolality and hyponatremia: Secondary | ICD-10-CM | POA: Diagnosis not present

## 2020-09-06 DIAGNOSIS — U071 COVID-19: Secondary | ICD-10-CM | POA: Diagnosis not present

## 2020-09-06 DIAGNOSIS — E119 Type 2 diabetes mellitus without complications: Secondary | ICD-10-CM | POA: Diagnosis not present

## 2020-09-06 DIAGNOSIS — I10 Essential (primary) hypertension: Secondary | ICD-10-CM | POA: Diagnosis not present

## 2020-09-06 DIAGNOSIS — R1312 Dysphagia, oropharyngeal phase: Secondary | ICD-10-CM | POA: Diagnosis not present

## 2020-09-06 DIAGNOSIS — J1282 Pneumonia due to coronavirus disease 2019: Secondary | ICD-10-CM | POA: Diagnosis not present

## 2020-09-06 DIAGNOSIS — J9601 Acute respiratory failure with hypoxia: Secondary | ICD-10-CM | POA: Diagnosis not present

## 2020-09-10 DIAGNOSIS — J9601 Acute respiratory failure with hypoxia: Secondary | ICD-10-CM | POA: Diagnosis not present

## 2020-09-10 DIAGNOSIS — E119 Type 2 diabetes mellitus without complications: Secondary | ICD-10-CM | POA: Diagnosis not present

## 2020-09-10 DIAGNOSIS — I10 Essential (primary) hypertension: Secondary | ICD-10-CM | POA: Diagnosis not present

## 2020-09-10 DIAGNOSIS — N181 Chronic kidney disease, stage 1: Secondary | ICD-10-CM | POA: Diagnosis not present

## 2020-09-10 DIAGNOSIS — J1282 Pneumonia due to coronavirus disease 2019: Secondary | ICD-10-CM | POA: Diagnosis not present

## 2020-09-10 DIAGNOSIS — U071 COVID-19: Secondary | ICD-10-CM | POA: Diagnosis not present

## 2020-09-10 DIAGNOSIS — E7849 Other hyperlipidemia: Secondary | ICD-10-CM | POA: Diagnosis not present

## 2020-09-10 DIAGNOSIS — R1312 Dysphagia, oropharyngeal phase: Secondary | ICD-10-CM | POA: Diagnosis not present

## 2020-09-12 DIAGNOSIS — J1282 Pneumonia due to coronavirus disease 2019: Secondary | ICD-10-CM | POA: Diagnosis not present

## 2020-09-12 DIAGNOSIS — R1312 Dysphagia, oropharyngeal phase: Secondary | ICD-10-CM | POA: Diagnosis not present

## 2020-09-12 DIAGNOSIS — E119 Type 2 diabetes mellitus without complications: Secondary | ICD-10-CM | POA: Diagnosis not present

## 2020-09-12 DIAGNOSIS — J9601 Acute respiratory failure with hypoxia: Secondary | ICD-10-CM | POA: Diagnosis not present

## 2020-09-12 DIAGNOSIS — I10 Essential (primary) hypertension: Secondary | ICD-10-CM | POA: Diagnosis not present

## 2020-09-12 DIAGNOSIS — U071 COVID-19: Secondary | ICD-10-CM | POA: Diagnosis not present

## 2020-09-16 DIAGNOSIS — I10 Essential (primary) hypertension: Secondary | ICD-10-CM | POA: Diagnosis not present

## 2020-09-16 DIAGNOSIS — J9601 Acute respiratory failure with hypoxia: Secondary | ICD-10-CM | POA: Diagnosis not present

## 2020-09-16 DIAGNOSIS — J1282 Pneumonia due to coronavirus disease 2019: Secondary | ICD-10-CM | POA: Diagnosis not present

## 2020-09-16 DIAGNOSIS — U071 COVID-19: Secondary | ICD-10-CM | POA: Diagnosis not present

## 2020-09-16 DIAGNOSIS — E119 Type 2 diabetes mellitus without complications: Secondary | ICD-10-CM | POA: Diagnosis not present

## 2020-09-16 DIAGNOSIS — R1312 Dysphagia, oropharyngeal phase: Secondary | ICD-10-CM | POA: Diagnosis not present

## 2020-09-19 ENCOUNTER — Encounter: Payer: Self-pay | Admitting: *Deleted

## 2020-09-19 ENCOUNTER — Other Ambulatory Visit: Payer: Medicare Other | Admitting: *Deleted

## 2020-09-19 DIAGNOSIS — J1282 Pneumonia due to coronavirus disease 2019: Secondary | ICD-10-CM | POA: Diagnosis not present

## 2020-09-19 DIAGNOSIS — E119 Type 2 diabetes mellitus without complications: Secondary | ICD-10-CM | POA: Diagnosis not present

## 2020-09-19 DIAGNOSIS — J9601 Acute respiratory failure with hypoxia: Secondary | ICD-10-CM | POA: Diagnosis not present

## 2020-09-19 DIAGNOSIS — I10 Essential (primary) hypertension: Secondary | ICD-10-CM | POA: Diagnosis not present

## 2020-09-19 DIAGNOSIS — U071 COVID-19: Secondary | ICD-10-CM | POA: Diagnosis not present

## 2020-09-19 DIAGNOSIS — R1312 Dysphagia, oropharyngeal phase: Secondary | ICD-10-CM | POA: Diagnosis not present

## 2020-09-19 NOTE — Patient Outreach (Signed)
[facility]) Care Management  09/19/2020  [name] Nov 08, 1945 [id number]   [facility] outreach to Transition of Care Referred patient  Referral Date: 08/30/20 Referral Source: Marthenia Rolling, MSN, RN,BSN-THN Post Acute Care Coordinator Referral reason Please assign to [place] for care coordination. Member transitioned from [facility] on 08/23/20 to home. Has [facility]. Lives with daughter [name] who is agreeable to home health.  Date of Admission:  Diagnosis:  Last admission 07/30/20- 08/05/20- weakness, confusion - acute respiratory failure with hypoxia [name] to COVID 19 pneumonia, Diabetes (DM) type 2   HgA1c 8.7 on 07/30/20   .THN Unsuccessful outreach   Outreach attempt to the home number  No answer. THN RN CM left HIPAA [facility] Portability and Accountability Act) compliant voicemail message along with CM's contact info.   Plan: [facility] RN CM scheduled this patient for another call attempt within 7-14 business days  Inge Waldroup L. Lavina Hamman, RN, BSN, [place] Coordinator Office number [phone] Mobile number [phone]  Main [facility] number [phone] Fax number [phone]  [place]. Lavina Hamman, RN, BSN, [place] Management Care Coordinator Direct Number [phone] Mobile number [phone]  Main THN number [phone] Fax number [phone]

## 2020-09-23 DIAGNOSIS — E119 Type 2 diabetes mellitus without complications: Secondary | ICD-10-CM | POA: Diagnosis not present

## 2020-09-23 DIAGNOSIS — U071 COVID-19: Secondary | ICD-10-CM | POA: Diagnosis not present

## 2020-09-23 DIAGNOSIS — J1282 Pneumonia due to coronavirus disease 2019: Secondary | ICD-10-CM | POA: Diagnosis not present

## 2020-09-23 DIAGNOSIS — I10 Essential (primary) hypertension: Secondary | ICD-10-CM | POA: Diagnosis not present

## 2020-09-23 DIAGNOSIS — R1312 Dysphagia, oropharyngeal phase: Secondary | ICD-10-CM | POA: Diagnosis not present

## 2020-09-23 DIAGNOSIS — J9601 Acute respiratory failure with hypoxia: Secondary | ICD-10-CM | POA: Diagnosis not present

## 2020-09-25 ENCOUNTER — Ambulatory Visit (HOSPITAL_COMMUNITY)
Admission: RE | Admit: 2020-09-25 | Discharge: 2020-09-25 | Disposition: A | Discharge status: Home / Self Care | Payer: Medicare Other | Source: Ambulatory Visit | Attending: Internal Medicine | Admitting: Internal Medicine

## 2020-09-25 ENCOUNTER — Other Ambulatory Visit: Payer: Self-pay

## 2020-09-25 DIAGNOSIS — Z7984 Long term (current) use of oral hypoglycemic drugs: Secondary | ICD-10-CM | POA: Diagnosis not present

## 2020-09-25 DIAGNOSIS — Z6827 Body mass index (BMI) 27.0-27.9, adult: Secondary | ICD-10-CM | POA: Diagnosis not present

## 2020-09-25 DIAGNOSIS — Z7982 Long term (current) use of aspirin: Secondary | ICD-10-CM | POA: Diagnosis not present

## 2020-09-25 DIAGNOSIS — U071 COVID-19: Secondary | ICD-10-CM | POA: Diagnosis not present

## 2020-09-25 DIAGNOSIS — F419 Anxiety disorder, unspecified: Secondary | ICD-10-CM | POA: Diagnosis not present

## 2020-09-25 DIAGNOSIS — F329 Major depressive disorder, single episode, unspecified: Secondary | ICD-10-CM | POA: Diagnosis not present

## 2020-09-25 DIAGNOSIS — Z9181 History of falling: Secondary | ICD-10-CM | POA: Diagnosis not present

## 2020-09-25 DIAGNOSIS — I69328 Other speech and language deficits following cerebral infarction: Secondary | ICD-10-CM | POA: Diagnosis not present

## 2020-09-25 DIAGNOSIS — I10 Essential (primary) hypertension: Secondary | ICD-10-CM | POA: Diagnosis not present

## 2020-09-25 DIAGNOSIS — E785 Hyperlipidemia, unspecified: Secondary | ICD-10-CM | POA: Diagnosis not present

## 2020-09-25 DIAGNOSIS — R0602 Shortness of breath: Secondary | ICD-10-CM | POA: Diagnosis not present

## 2020-09-25 DIAGNOSIS — J9601 Acute respiratory failure with hypoxia: Secondary | ICD-10-CM | POA: Diagnosis not present

## 2020-09-25 DIAGNOSIS — R059 Cough, unspecified: Secondary | ICD-10-CM | POA: Diagnosis not present

## 2020-09-25 DIAGNOSIS — Z969 Presence of functional implant, unspecified: Secondary | ICD-10-CM | POA: Diagnosis not present

## 2020-09-25 DIAGNOSIS — E119 Type 2 diabetes mellitus without complications: Secondary | ICD-10-CM | POA: Diagnosis not present

## 2020-09-25 DIAGNOSIS — E871 Hypo-osmolality and hyponatremia: Secondary | ICD-10-CM | POA: Diagnosis not present

## 2020-09-25 DIAGNOSIS — E039 Hypothyroidism, unspecified: Secondary | ICD-10-CM | POA: Diagnosis not present

## 2020-09-25 DIAGNOSIS — K2100 Gastro-esophageal reflux disease with esophagitis, without bleeding: Secondary | ICD-10-CM | POA: Diagnosis not present

## 2020-09-25 DIAGNOSIS — R053 Chronic cough: Secondary | ICD-10-CM | POA: Insufficient documentation

## 2020-09-25 DIAGNOSIS — Z9981 Dependence on supplemental oxygen: Secondary | ICD-10-CM | POA: Diagnosis not present

## 2020-09-25 DIAGNOSIS — K5790 Diverticulosis of intestine, part unspecified, without perforation or abscess without bleeding: Secondary | ICD-10-CM | POA: Diagnosis not present

## 2020-09-25 DIAGNOSIS — R1312 Dysphagia, oropharyngeal phase: Secondary | ICD-10-CM | POA: Diagnosis not present

## 2020-09-25 DIAGNOSIS — E7800 Pure hypercholesterolemia, unspecified: Secondary | ICD-10-CM | POA: Diagnosis not present

## 2020-09-25 DIAGNOSIS — J1282 Pneumonia due to coronavirus disease 2019: Secondary | ICD-10-CM | POA: Diagnosis not present

## 2020-09-25 DIAGNOSIS — J90 Pleural effusion, not elsewhere classified: Secondary | ICD-10-CM | POA: Diagnosis not present

## 2020-09-26 ENCOUNTER — Other Ambulatory Visit: Payer: Self-pay | Admitting: *Deleted

## 2020-09-26 ENCOUNTER — Encounter: Payer: Self-pay | Admitting: *Deleted

## 2020-09-26 DIAGNOSIS — E119 Type 2 diabetes mellitus without complications: Secondary | ICD-10-CM | POA: Diagnosis not present

## 2020-09-26 DIAGNOSIS — R1312 Dysphagia, oropharyngeal phase: Secondary | ICD-10-CM | POA: Diagnosis not present

## 2020-09-26 DIAGNOSIS — J9601 Acute respiratory failure with hypoxia: Secondary | ICD-10-CM | POA: Diagnosis not present

## 2020-09-26 DIAGNOSIS — U071 COVID-19: Secondary | ICD-10-CM | POA: Diagnosis not present

## 2020-09-26 DIAGNOSIS — I10 Essential (primary) hypertension: Secondary | ICD-10-CM | POA: Diagnosis not present

## 2020-09-26 DIAGNOSIS — J1282 Pneumonia due to coronavirus disease 2019: Secondary | ICD-10-CM | POA: Diagnosis not present

## 2020-09-26 NOTE — Patient Outreach (Signed)
[facility]) Care Management  09/26/2020  JAMYLA ARD Dec 08, 1945 [id number]   [facility] outreach to complex care  Referral Date:08/30/20 Referral Source:Atika [place] Crane, MSN, RN,BSN-THN Post Acute Care Coordinator Referral reasonPlease assign to [facility] RN CM for care coordination. Member transitioned from [facility] on 08/23/20 to home. Has [facility]. Lives with daughter [name] who is agreeable to home health. Date of Admission:  Diagnosis:  Last admission 07/30/20- 08/05/20- weakness, confusion - acute respiratory failure with hypoxia due to COVID 19 pneumonia, Diabetes (DM) type 2  HgA1c 8.7 on 07/30/20   Patient's daughter [name] is able to verify HIPAA ([place]) identifiers Reviewed and addressed the purpose of the follow up call with the patient  Consent: [facility] ([place]) RN CM reviewed [facility] services with patient. Patient gave verbal consent for services.  Follow assessment  Mrs Holtry is reported to be doing well without  Saw Dr Hilma Favors on 09/25/20 Last flu  Was 07/15/2018, none in 202 r/t covid but reports she has been informed she will not receive one at this time Referred her to her MD  [place] 09/25/20 after she went to MD, got labs and went to [place] to do an xray She reportedly fell as she was trying to get up the concrete side walk. She hit her knee and scraped it but denies pain today nor on 09/25/20 She states the staff placed her in a w/c for safety reasons after evaluated  She did have tennis shoes on but had not raised foot her foot high enough and did not have her walker She was encouraged to take her walker with her for safety when she plans to go out most of the day  She voiced understanding Sent EMMI on fall prevention and home safety  Hypertension/Diabetes  cbg 136 last HgA1c on 07/30/20 was 8.7 Better management at home vs snf Less carbohydrates   Using oxygen at 3 L now vs  4 uses pulse oximeter to monitor status  Plans [facility] RN CM will follow up with pt and daughter within the next 45-60 business days as agreed Pt encouraged to return a call to [facility] RN CM prn Routed note to MD  Goals      Patient Stated   .  [facility]) Monitor and Manage My Blood Sugar-Diabetes Type 2 (pt-stated)      Timeframe:  Long-Range Goal Priority:  Medium Start Date:       09/26/20                      Expected End Date:     12/3119                  Follow Up Date 11/28/19   - check blood sugar at prescribed times     Notes:     .  [facility]) Prevent Falls and Injury (pt-stated)      Follow Up Date 11/28/19   - always use handrails on the stairs - always wear low-heeled or flat shoes or slippers with nonskid soles - use a cane or walker    Notes:         [place] Jeff L. Lavina Hamman, RN, BSN, [place] Coordinator Office number [phone] Main [facility] number [phone] Fax number [phone]

## 2020-09-27 DIAGNOSIS — E119 Type 2 diabetes mellitus without complications: Secondary | ICD-10-CM | POA: Diagnosis not present

## 2020-09-27 DIAGNOSIS — J9601 Acute respiratory failure with hypoxia: Secondary | ICD-10-CM | POA: Diagnosis not present

## 2020-09-27 DIAGNOSIS — J1282 Pneumonia due to coronavirus disease 2019: Secondary | ICD-10-CM | POA: Diagnosis not present

## 2020-09-27 DIAGNOSIS — I10 Essential (primary) hypertension: Secondary | ICD-10-CM | POA: Diagnosis not present

## 2020-09-27 DIAGNOSIS — U071 COVID-19: Secondary | ICD-10-CM | POA: Diagnosis not present

## 2020-09-27 DIAGNOSIS — R1312 Dysphagia, oropharyngeal phase: Secondary | ICD-10-CM | POA: Diagnosis not present

## 2020-10-01 DIAGNOSIS — I10 Essential (primary) hypertension: Secondary | ICD-10-CM | POA: Diagnosis not present

## 2020-10-01 DIAGNOSIS — J1282 Pneumonia due to coronavirus disease 2019: Secondary | ICD-10-CM | POA: Diagnosis not present

## 2020-10-01 DIAGNOSIS — J9601 Acute respiratory failure with hypoxia: Secondary | ICD-10-CM | POA: Diagnosis not present

## 2020-10-01 DIAGNOSIS — U071 COVID-19: Secondary | ICD-10-CM | POA: Diagnosis not present

## 2020-10-01 DIAGNOSIS — R1312 Dysphagia, oropharyngeal phase: Secondary | ICD-10-CM | POA: Diagnosis not present

## 2020-10-01 DIAGNOSIS — E119 Type 2 diabetes mellitus without complications: Secondary | ICD-10-CM | POA: Diagnosis not present

## 2020-10-02 DIAGNOSIS — J9601 Acute respiratory failure with hypoxia: Secondary | ICD-10-CM | POA: Diagnosis not present

## 2020-10-02 DIAGNOSIS — J1282 Pneumonia due to coronavirus disease 2019: Secondary | ICD-10-CM | POA: Diagnosis not present

## 2020-10-02 DIAGNOSIS — R1312 Dysphagia, oropharyngeal phase: Secondary | ICD-10-CM | POA: Diagnosis not present

## 2020-10-02 DIAGNOSIS — U071 COVID-19: Secondary | ICD-10-CM | POA: Diagnosis not present

## 2020-10-08 DIAGNOSIS — R1312 Dysphagia, oropharyngeal phase: Secondary | ICD-10-CM | POA: Diagnosis not present

## 2020-10-08 DIAGNOSIS — I10 Essential (primary) hypertension: Secondary | ICD-10-CM | POA: Diagnosis not present

## 2020-10-08 DIAGNOSIS — J1282 Pneumonia due to coronavirus disease 2019: Secondary | ICD-10-CM | POA: Diagnosis not present

## 2020-10-08 DIAGNOSIS — U071 COVID-19: Secondary | ICD-10-CM | POA: Diagnosis not present

## 2020-10-08 DIAGNOSIS — J9601 Acute respiratory failure with hypoxia: Secondary | ICD-10-CM | POA: Diagnosis not present

## 2020-10-08 DIAGNOSIS — E119 Type 2 diabetes mellitus without complications: Secondary | ICD-10-CM | POA: Diagnosis not present

## 2020-10-10 DIAGNOSIS — I10 Essential (primary) hypertension: Secondary | ICD-10-CM | POA: Diagnosis not present

## 2020-10-10 DIAGNOSIS — J9601 Acute respiratory failure with hypoxia: Secondary | ICD-10-CM | POA: Diagnosis not present

## 2020-10-10 DIAGNOSIS — E119 Type 2 diabetes mellitus without complications: Secondary | ICD-10-CM | POA: Diagnosis not present

## 2020-10-10 DIAGNOSIS — J1282 Pneumonia due to coronavirus disease 2019: Secondary | ICD-10-CM | POA: Diagnosis not present

## 2020-10-10 DIAGNOSIS — R1312 Dysphagia, oropharyngeal phase: Secondary | ICD-10-CM | POA: Diagnosis not present

## 2020-10-10 DIAGNOSIS — U071 COVID-19: Secondary | ICD-10-CM | POA: Diagnosis not present

## 2020-10-11 DIAGNOSIS — E119 Type 2 diabetes mellitus without complications: Secondary | ICD-10-CM | POA: Diagnosis not present

## 2020-10-11 DIAGNOSIS — E782 Mixed hyperlipidemia: Secondary | ICD-10-CM | POA: Diagnosis not present

## 2020-10-11 DIAGNOSIS — I10 Essential (primary) hypertension: Secondary | ICD-10-CM | POA: Diagnosis not present

## 2020-10-11 DIAGNOSIS — N181 Chronic kidney disease, stage 1: Secondary | ICD-10-CM | POA: Diagnosis not present

## 2020-10-15 DIAGNOSIS — I10 Essential (primary) hypertension: Secondary | ICD-10-CM | POA: Diagnosis not present

## 2020-10-15 DIAGNOSIS — U071 COVID-19: Secondary | ICD-10-CM | POA: Diagnosis not present

## 2020-10-15 DIAGNOSIS — J1282 Pneumonia due to coronavirus disease 2019: Secondary | ICD-10-CM | POA: Diagnosis not present

## 2020-10-15 DIAGNOSIS — R1312 Dysphagia, oropharyngeal phase: Secondary | ICD-10-CM | POA: Diagnosis not present

## 2020-10-15 DIAGNOSIS — J9601 Acute respiratory failure with hypoxia: Secondary | ICD-10-CM | POA: Diagnosis not present

## 2020-10-15 DIAGNOSIS — E119 Type 2 diabetes mellitus without complications: Secondary | ICD-10-CM | POA: Diagnosis not present

## 2020-11-09 DIAGNOSIS — E119 Type 2 diabetes mellitus without complications: Secondary | ICD-10-CM | POA: Diagnosis not present

## 2020-11-09 DIAGNOSIS — N181 Chronic kidney disease, stage 1: Secondary | ICD-10-CM | POA: Diagnosis not present

## 2020-11-09 DIAGNOSIS — E782 Mixed hyperlipidemia: Secondary | ICD-10-CM | POA: Diagnosis not present

## 2020-11-09 DIAGNOSIS — I10 Essential (primary) hypertension: Secondary | ICD-10-CM | POA: Diagnosis not present

## 2020-11-27 ENCOUNTER — Other Ambulatory Visit: Payer: Self-pay | Admitting: *Deleted

## 2020-11-27 ENCOUNTER — Other Ambulatory Visit: Payer: Self-pay

## 2020-11-27 ENCOUNTER — Encounter: Payer: Self-pay | Admitting: *Deleted

## 2020-11-27 NOTE — Patient Outreach (Signed)
[facility] Customer service manager [facility]) Care Management  11/27/2020  DUNYA MEINERS 1945-11-02 [id number]   [facility] tocomplex care  Referral Date:08/30/20 Referral Source:Atika Margo Aye, MSN, RN,BSN-THN Post Acute Care Coordinator Referral reasonPlease assign to [facility] RN CM for care coordination. Member transitioned from [facility] on 08/23/20 to home. [facility]. Lives with daughter Burnell Blanks who is agreeable to home health. Date of Admission:  Diagnosis: Last admission 07/30/20- 08/05/20- weakness, confusion - acute respiratory failure with hypoxia due to COVID 19 pneumonia,Diabetes (DM) type 2 HgA1c 8.7 on 07/30/20   Patient's daughter Grover Canavan is able to verify HIPAA [facility] Portability and Accountability Act) identifiers Reviewed and addressed the purpose of the follow up call with the patient  Consent: THN([facility]) RN CM reviewed [facility] services with patient. Patient gave verbal consent for services.  Follow assessment  Sees Dr Phillips Odor primarily and the Dr Sherwood Gambler if Dr Phillips Odor is not available  Seen by Dr Phillips Odor last 09/25/20 Her daughters Grover Canavan and Jasmine December continue to provide good support  Patient is reported to be doing very good  Medications Denies issues with medications, (cost etc) confirms no longer taking insulins Transportation Denies issues with transportation to medical appointments Falls No falls since last outreach in December 2021 Diabetes (DM) type 2 Ms Fager is reporting lows 60-70 x 3 in the mornings since last outreach She has resolved them with intake of Orange juice  THN RN CM educated on use of proteins like peanut butter and night snack to assist with hypoglycemic episodes She reports hyperglycemia values of 160-200 She report average CBG value of 160's   11/24/20 Wt=153 lb reviewed CHF zones and action plan  Covid Denies symptoms since last outreach Oxygen intake was decreased to 2 L and is now discontinued  after pcp discussed the oxygen goal by January 10 2021 was to have oxygen discontinued per daughter Grover Canavan Monitoring at home with pulse oximeter values 98-99 % per pt  They voice awareness of oxygen use prn but reports has not needed oxygen in the "last two weeks"  [facility] RN Cm encouraged follow up with pcp for oxygen challenge test Daughter voice interest of having oxygen returned to DME provider  Has not received further vaccines for flu nor covid  Patient Active Problem List   Diagnosis Date Noted  . Uncontrolled type 2 diabetes mellitus with hyperglycemia (HCC) 07/31/2020  . Acute respiratory failure with hypoxia (HCC) 07/31/2020  . Essential hypertension 07/30/2020  . Acute metabolic encephalopathy 07/30/2020  . Gastroesophageal reflux disease with esophagitis 06/01/2018  . Diverticulosis 04/28/2018  . Diabetes (HCC) 04/04/2018  . History of adenomatous polyp of colon 04/04/2018  . Hyperlipidemia 04/04/2018   EMMIs sent via confirmed e-mail address in EPIC blood sugar emergencies for type 2 diabetes, covid -19 full program, adult oxygen therapy and adult heart failure  Plans Patient agrees to care plan and follow up within the next 60 -75 business days Pt encouraged to return a call to [facility] RN CM prn  Goals Addressed              This Visit's Progress     Patient Stated   .  [facility]) Monitor and Manage My Blood Sugar-Diabetes Type 2 (pt-stated)   On track     Timeframe:  Long-Range Goal Priority:  Medium Start Date:       09/26/20                      Expected End  Date:     12/3119                  Follow Up Date 01/25/21   - check blood sugar at prescribed times     Notes: 11/27/20  continues to monitor at home Pending 2022 HgA1c reporting lows 60-70 x 3 in the mornings since last outreach She has resolved them with intake of Orange juice  Continues use of alogliptin No insulins She reports hyperglycemia values of 160-200 She report average CBG value of 160's    .   COMPLETED: (THN) Prevent Falls and Injury (pt-stated)   On track     Follow Up Date 11/28/19- Goal met   - always use handrails on the stairs - always wear low-heeled or flat shoes or slippers with nonskid soles - use a cane or walker     Notes: 11/27/20 Falls No falls since last outreach in December 2021       Page. Lavina Hamman, RN, BSN, [place] Coordinator Office number [phone] Main [facility] number [phone] Fax number [phone]

## 2020-12-09 DIAGNOSIS — E119 Type 2 diabetes mellitus without complications: Secondary | ICD-10-CM | POA: Diagnosis not present

## 2020-12-09 DIAGNOSIS — I10 Essential (primary) hypertension: Secondary | ICD-10-CM | POA: Diagnosis not present

## 2020-12-09 DIAGNOSIS — E782 Mixed hyperlipidemia: Secondary | ICD-10-CM | POA: Diagnosis not present

## 2020-12-09 DIAGNOSIS — N181 Chronic kidney disease, stage 1: Secondary | ICD-10-CM | POA: Diagnosis not present

## 2020-12-18 DIAGNOSIS — E1129 Type 2 diabetes mellitus with other diabetic kidney complication: Secondary | ICD-10-CM | POA: Diagnosis not present

## 2020-12-18 DIAGNOSIS — U071 COVID-19: Secondary | ICD-10-CM | POA: Diagnosis not present

## 2020-12-18 DIAGNOSIS — E039 Hypothyroidism, unspecified: Secondary | ICD-10-CM | POA: Diagnosis not present

## 2020-12-18 DIAGNOSIS — I10 Essential (primary) hypertension: Secondary | ICD-10-CM | POA: Diagnosis not present

## 2020-12-18 DIAGNOSIS — E663 Overweight: Secondary | ICD-10-CM | POA: Diagnosis not present

## 2020-12-18 DIAGNOSIS — E7849 Other hyperlipidemia: Secondary | ICD-10-CM | POA: Diagnosis not present

## 2020-12-18 DIAGNOSIS — Z6827 Body mass index (BMI) 27.0-27.9, adult: Secondary | ICD-10-CM | POA: Diagnosis not present

## 2021-01-08 DIAGNOSIS — E119 Type 2 diabetes mellitus without complications: Secondary | ICD-10-CM | POA: Diagnosis not present

## 2021-01-08 DIAGNOSIS — N181 Chronic kidney disease, stage 1: Secondary | ICD-10-CM | POA: Diagnosis not present

## 2021-01-08 DIAGNOSIS — E7849 Other hyperlipidemia: Secondary | ICD-10-CM | POA: Diagnosis not present

## 2021-01-08 DIAGNOSIS — I10 Essential (primary) hypertension: Secondary | ICD-10-CM | POA: Diagnosis not present

## 2021-01-27 ENCOUNTER — Other Ambulatory Visit: Payer: Self-pay | Admitting: *Deleted

## 2021-01-27 NOTE — Patient Outreach (Signed)
[facility]) Care Management  01/27/2021  [name] [birth year] [id number]   THNunsuccessful outreach tocomplex carepatient  Referral Date:08/30/20 Referral Source:Atika [place] Crane, MSN, RN,BSN-THN Post Acute Care Coordinator Referral reasonPlease assign to [facility] CMfor care coordination. Member transitioned from [facility] on 08/23/20 to home. Has [facility]. Lives with daughter [name] who is agreeable to home health. Date of Admission:  Diagnosis: Last admission 07/30/20- 08/05/20- weakness, confusion - acute respiratory failure with hypoxia due to COVID 19 pneumonia,Diabetes (DM) type 2 HgA1c 8.7 on 07/30/20   [id number] outreach attempts x 3 to [phone] unsuccessful Line busy unable to leave a voice message [phone] outreach to [phone] unsuccessful as the mail box is full Sent a text message to daughter [name]   Plans THN RN CM will follow up with patient within the next 7-10 business days   [place]le[place] Prill L. Lavina Hamman, RN, BSN, [place] Coordinator Office number [phone] Main [facility] number [phone] Fax number [phone]

## 2021-02-07 ENCOUNTER — Other Ambulatory Visit: Payer: Self-pay | Admitting: *Deleted

## 2021-02-07 NOTE — Patient Outreach (Signed)
[facility]) Care Management  02/07/2021  SONNET RIZOR 02-Oct-1946 [id number]   [facility] unsuccessful outreach tocomplex carepatient  Referral Date:08/30/20 Referral Source:Atika [place] Crane, MSN, RN,BSN-THN Post Acute Care Coordinator Referral reasonPlease assign to [facility] CMfor care coordination. Member transitioned from [facility] on 08/23/20 to home. Has [facility]. Lives with daughter [name] who is agreeable to home health.  Last successful outreach on 11/27/20  Last admission 07/30/20- 08/05/20- weakness, confusion - acute respiratory failure with hypoxia due to COVID 19 pneumonia,Diabetes (DM) type 2 HgA1c 8.7 on 07/30/20   outreach attempt to [phone] No answer. THN RN CM left HIPAA HIPAA [facility] Portability and Accountability Act) compliant voicemail message along with CM's contact info.    Plans [facility] RN CM will follow up with patient within the next 4-7 business days Unsuccessful outreaches on 01/27/21 and 02/07/21  Sent unsuccessful outreach letter   [place]. Lavina Hamman, RN, BSN, [place] Coordinator Office number [phone] Main [facility] number [phone] Fax number [phone]

## 2021-02-08 DIAGNOSIS — E7849 Other hyperlipidemia: Secondary | ICD-10-CM | POA: Diagnosis not present

## 2021-02-08 DIAGNOSIS — I10 Essential (primary) hypertension: Secondary | ICD-10-CM | POA: Diagnosis not present

## 2021-02-08 DIAGNOSIS — E119 Type 2 diabetes mellitus without complications: Secondary | ICD-10-CM | POA: Diagnosis not present

## 2021-02-08 DIAGNOSIS — N181 Chronic kidney disease, stage 1: Secondary | ICD-10-CM | POA: Diagnosis not present

## 2021-02-13 ENCOUNTER — Other Ambulatory Visit: Payer: Self-pay | Admitting: *Deleted

## 2021-02-13 NOTE — Patient Outreach (Signed)
[facility]) Care Management  02/13/2021  ANYSHA FRAPPIER 1946/01/02 [id number]   THN3rd unsuccessfuloutreach tocomplex carepatient  Referral Date:08/30/20 Referral Source:Atika [place] Crane, MSN, RN,BSN-THN Post Acute Care Coordinator Referral reasonPlease assign to [facility] CMfor care coordination. Member transitioned from [facility] on 08/23/20 to home. Has [facility]. Lives with daughter [name] who is agreeable to home health.  Last successful outreach on 11/27/20  Last admission 07/30/20- 08/05/20- weakness, confusion - acute respiratory failure with hypoxia due to COVID 19 pneumonia,Diabetes (DM) type 2 HgA1c 8.7 on 07/30/20   outreach attempt to [phone] female answered. THN RN CM left HIPAA HIPAA [facility] Portability and Accountability Act) compliant voicemail message along with CM's contact info.    Plans [facility] RN CM placed patient on hold per workflow and will follow up for [facility] case closure within 10 business days Unsuccessful outreaches on 01/27/21, 02/07/21& 02/13/21 Sent unsuccessful outreach letter 02/07/21  Joelene Millin L. Lavina Hamman, RN, BSN, [place] Coordinator Office number [phone] Main [facility] number [phone] Fax number [phone]

## 2021-02-27 ENCOUNTER — Other Ambulatory Visit: Payer: Self-pay | Admitting: *Deleted

## 2021-02-27 DIAGNOSIS — D0472 Carcinoma in situ of skin of left lower limb, including hip: Secondary | ICD-10-CM | POA: Diagnosis not present

## 2021-02-27 DIAGNOSIS — D0439 Carcinoma in situ of skin of other parts of face: Secondary | ICD-10-CM | POA: Diagnosis not present

## 2021-02-27 DIAGNOSIS — D225 Melanocytic nevi of trunk: Secondary | ICD-10-CM | POA: Diagnosis not present

## 2021-02-27 DIAGNOSIS — L218 Other seborrheic dermatitis: Secondary | ICD-10-CM | POA: Diagnosis not present

## 2021-02-27 NOTE — Patient Outreach (Signed)
[facility]) Care Management  02/27/2021  [name] November 05, 1945 [id number]   THN4thunsuccessfuloutreach tocomplex carepatient  Referral Date:08/30/20 Referral Source:Atika [place] Crane, MSN, RN,BSN-THN Post Acute Care Coordinator Referral reasonPlease assign to [facility] CMfor care coordination. Member transitioned from [facility] on 08/23/20 to home. Has [facility]. Lives with daughter [name] who is agreeable to home health.    Last admission 07/30/20- 08/05/20- weakness, confusion - acute respiratory failure with hypoxia due to COVID 19 pneumonia,Diabetes (DM) type 2 HgA1c 8.7 on 07/30/20  Last successful outreach on 11/27/20 Pt left a message on 02/19/21 for limited availability for outreach outreach attempt to [place] CM left HIPAA HIPAA ([place]) compliant voicemail message along with CM's contact info.    Plans [facility] RN CM placed patient on hold per workflow and will follow up for [facility] case closure within 10business days Unsuccessful outreaches on 01/27/21, 02/07/21, 02/13/21 02/27/21 Sent unsuccessful outreach letter4/29/22  Joelene Millin L. Lavina Hamman, RN, BSN, [place] Coordinator Office number [phone] Main [facility] number [phone] Fax number [phone]

## 2021-03-05 ENCOUNTER — Other Ambulatory Visit: Payer: Self-pay | Admitting: *Deleted

## 2021-03-05 NOTE — Patient Outreach (Signed)
[place] [facility]) Care Management  03/05/2021  [name] 1946/01/20 [id number]   [facility] Case closure    Referral Date:08/30/20 Referral Source:Atika [place] Crane, MSN, RN,BSN-THN Post Acute Care Coordinator Referral reasonPlease assign to [facility] CMfor care coordination. Member transitioned from [facility] on 08/23/20 to home. Has [facility]. Lives with daughter [name] who is agreeable to home health.    Last admission 07/30/20- 08/05/20- weakness, confusion - acute respiratory failure with hypoxia due to COVID 19 pneumonia,Diabetes (DM) type 2 HgA1c 8.7 on 07/30/20  Unsuccessful outreaches on 01/27/21,02/07/21, 02/13/21 02/27/21 Sent unsuccessful outreach letter4/29/22 without a response   Plan THN RN CM will close case after no response from patient within 10 business days. Unable to reach Case closure letters sent to patient and MD  Joelene Millin L. Lavina Hamman, RN, BSN, [place] Coordinator Office number [phone] Mobile number [phone]  Main THN number [phone] Fax number [phone]

## 2021-03-06 ENCOUNTER — Ambulatory Visit: Payer: Self-pay | Admitting: *Deleted

## 2021-03-11 DIAGNOSIS — E782 Mixed hyperlipidemia: Secondary | ICD-10-CM | POA: Diagnosis not present

## 2021-03-11 DIAGNOSIS — I10 Essential (primary) hypertension: Secondary | ICD-10-CM | POA: Diagnosis not present

## 2021-03-11 DIAGNOSIS — E119 Type 2 diabetes mellitus without complications: Secondary | ICD-10-CM | POA: Diagnosis not present

## 2021-03-11 DIAGNOSIS — N181 Chronic kidney disease, stage 1: Secondary | ICD-10-CM | POA: Diagnosis not present

## 2021-03-19 DIAGNOSIS — Z1231 Encounter for screening mammogram for malignant neoplasm of breast: Secondary | ICD-10-CM | POA: Diagnosis not present

## 2021-04-09 DIAGNOSIS — Z6830 Body mass index (BMI) 30.0-30.9, adult: Secondary | ICD-10-CM | POA: Diagnosis not present

## 2021-04-09 DIAGNOSIS — L237 Allergic contact dermatitis due to plants, except food: Secondary | ICD-10-CM | POA: Diagnosis not present

## 2021-04-10 DIAGNOSIS — E119 Type 2 diabetes mellitus without complications: Secondary | ICD-10-CM | POA: Diagnosis not present

## 2021-04-10 DIAGNOSIS — E782 Mixed hyperlipidemia: Secondary | ICD-10-CM | POA: Diagnosis not present

## 2021-04-10 DIAGNOSIS — N181 Chronic kidney disease, stage 1: Secondary | ICD-10-CM | POA: Diagnosis not present

## 2021-04-10 DIAGNOSIS — I10 Essential (primary) hypertension: Secondary | ICD-10-CM | POA: Diagnosis not present

## 2021-04-17 DIAGNOSIS — Z85828 Personal history of other malignant neoplasm of skin: Secondary | ICD-10-CM | POA: Diagnosis not present

## 2021-04-17 DIAGNOSIS — L570 Actinic keratosis: Secondary | ICD-10-CM | POA: Diagnosis not present

## 2021-04-17 DIAGNOSIS — X32XXXD Exposure to sunlight, subsequent encounter: Secondary | ICD-10-CM | POA: Diagnosis not present

## 2021-04-17 DIAGNOSIS — Z08 Encounter for follow-up examination after completed treatment for malignant neoplasm: Secondary | ICD-10-CM | POA: Diagnosis not present

## 2021-04-20 DIAGNOSIS — R21 Rash and other nonspecific skin eruption: Secondary | ICD-10-CM | POA: Diagnosis not present

## 2021-04-20 DIAGNOSIS — Z6830 Body mass index (BMI) 30.0-30.9, adult: Secondary | ICD-10-CM | POA: Diagnosis not present

## 2021-05-11 DIAGNOSIS — N181 Chronic kidney disease, stage 1: Secondary | ICD-10-CM | POA: Diagnosis not present

## 2021-05-11 DIAGNOSIS — E782 Mixed hyperlipidemia: Secondary | ICD-10-CM | POA: Diagnosis not present

## 2021-05-11 DIAGNOSIS — E119 Type 2 diabetes mellitus without complications: Secondary | ICD-10-CM | POA: Diagnosis not present

## 2021-05-11 DIAGNOSIS — I10 Essential (primary) hypertension: Secondary | ICD-10-CM | POA: Diagnosis not present

## 2021-05-21 DIAGNOSIS — E663 Overweight: Secondary | ICD-10-CM | POA: Diagnosis not present

## 2021-05-21 DIAGNOSIS — Z1331 Encounter for screening for depression: Secondary | ICD-10-CM | POA: Diagnosis not present

## 2021-05-21 DIAGNOSIS — E1129 Type 2 diabetes mellitus with other diabetic kidney complication: Secondary | ICD-10-CM | POA: Diagnosis not present

## 2021-05-21 DIAGNOSIS — E1165 Type 2 diabetes mellitus with hyperglycemia: Secondary | ICD-10-CM | POA: Diagnosis not present

## 2021-05-21 DIAGNOSIS — U071 COVID-19: Secondary | ICD-10-CM | POA: Diagnosis not present

## 2021-05-21 DIAGNOSIS — Z0001 Encounter for general adult medical examination with abnormal findings: Secondary | ICD-10-CM | POA: Diagnosis not present

## 2021-05-21 DIAGNOSIS — Z6827 Body mass index (BMI) 27.0-27.9, adult: Secondary | ICD-10-CM | POA: Diagnosis not present

## 2021-05-21 DIAGNOSIS — E782 Mixed hyperlipidemia: Secondary | ICD-10-CM | POA: Diagnosis not present

## 2021-05-21 DIAGNOSIS — Z20822 Contact with and (suspected) exposure to covid-19: Secondary | ICD-10-CM | POA: Diagnosis not present

## 2021-07-10 DIAGNOSIS — H35363 Drusen (degenerative) of macula, bilateral: Secondary | ICD-10-CM | POA: Diagnosis not present

## 2021-07-10 DIAGNOSIS — H35371 Puckering of macula, right eye: Secondary | ICD-10-CM | POA: Diagnosis not present

## 2021-07-10 DIAGNOSIS — E119 Type 2 diabetes mellitus without complications: Secondary | ICD-10-CM | POA: Diagnosis not present

## 2021-07-10 DIAGNOSIS — H35033 Hypertensive retinopathy, bilateral: Secondary | ICD-10-CM | POA: Diagnosis not present

## 2021-08-21 DIAGNOSIS — Z20822 Contact with and (suspected) exposure to covid-19: Secondary | ICD-10-CM | POA: Diagnosis not present

## 2021-09-10 DIAGNOSIS — Z20828 Contact with and (suspected) exposure to other viral communicable diseases: Secondary | ICD-10-CM | POA: Diagnosis not present

## 2021-09-17 ENCOUNTER — Encounter (HOSPITAL_COMMUNITY): Payer: Self-pay

## 2021-09-17 ENCOUNTER — Emergency Department (HOSPITAL_COMMUNITY): Payer: Medicare Other

## 2021-09-17 ENCOUNTER — Emergency Department (HOSPITAL_COMMUNITY)
Admission: EM | Admit: 2021-09-17 | Discharge: 2021-09-17 | Disposition: A | Discharge status: Home / Self Care | Payer: Medicare Other | Attending: Emergency Medicine | Admitting: Emergency Medicine

## 2021-09-17 ENCOUNTER — Other Ambulatory Visit: Payer: Self-pay

## 2021-09-17 DIAGNOSIS — M25519 Pain in unspecified shoulder: Secondary | ICD-10-CM | POA: Diagnosis not present

## 2021-09-17 DIAGNOSIS — R52 Pain, unspecified: Secondary | ICD-10-CM | POA: Diagnosis not present

## 2021-09-17 DIAGNOSIS — S4991XA Unspecified injury of right shoulder and upper arm, initial encounter: Secondary | ICD-10-CM | POA: Diagnosis present

## 2021-09-17 DIAGNOSIS — S42021A Displaced fracture of shaft of right clavicle, initial encounter for closed fracture: Secondary | ICD-10-CM | POA: Diagnosis not present

## 2021-09-17 DIAGNOSIS — Z79899 Other long term (current) drug therapy: Secondary | ICD-10-CM | POA: Insufficient documentation

## 2021-09-17 DIAGNOSIS — E119 Type 2 diabetes mellitus without complications: Secondary | ICD-10-CM | POA: Insufficient documentation

## 2021-09-17 DIAGNOSIS — Z7982 Long term (current) use of aspirin: Secondary | ICD-10-CM | POA: Insufficient documentation

## 2021-09-17 DIAGNOSIS — S42001A Fracture of unspecified part of right clavicle, initial encounter for closed fracture: Secondary | ICD-10-CM

## 2021-09-17 DIAGNOSIS — I10 Essential (primary) hypertension: Secondary | ICD-10-CM | POA: Insufficient documentation

## 2021-09-17 DIAGNOSIS — Z7984 Long term (current) use of oral hypoglycemic drugs: Secondary | ICD-10-CM | POA: Diagnosis not present

## 2021-09-17 DIAGNOSIS — M25511 Pain in right shoulder: Secondary | ICD-10-CM | POA: Diagnosis not present

## 2021-09-17 DIAGNOSIS — W130XXA Fall from, out of or through balcony, initial encounter: Secondary | ICD-10-CM | POA: Insufficient documentation

## 2021-09-17 DIAGNOSIS — W19XXXA Unspecified fall, initial encounter: Secondary | ICD-10-CM | POA: Diagnosis not present

## 2021-09-17 MED ORDER — LOSARTAN POTASSIUM 25 MG PO TABS
100.0000 mg | ORAL_TABLET | Freq: Once | ORAL | Status: AC
  Administered 2021-09-17: 100 mg via ORAL
  Filled 2021-09-17: qty 4

## 2021-09-17 MED ORDER — HYDROCODONE-ACETAMINOPHEN 5-325 MG PO TABS: 1.0000 | ORAL_TABLET | Freq: Four times a day (QID) | ORAL | 0 refills | Status: DC | PRN

## 2021-09-17 MED ORDER — HYDROCODONE-ACETAMINOPHEN 5-325 MG PO TABS
1.0000 | ORAL_TABLET | Freq: Once | ORAL | Status: AC
  Administered 2021-09-17: 1 via ORAL
  Filled 2021-09-17: qty 1

## 2021-09-17 NOTE — ED Notes (Signed)
Pt BP trending upwards, most recent reading 209/81. Pt states she takes Losartan in the evening prior to bedtime and has not taken her medication today. MD made aware and at pt bedside at this time.

## 2021-09-17 NOTE — ED Triage Notes (Signed)
Pt brought in via [facility]. States pt fell off porch this afternoon and injured right shoulder. Denies LOC or hitting head. Is able to move shoulder up and down. Pain and tenderness is present.

## 2021-09-17 NOTE — ED Provider Notes (Signed)
[facility] EMERGENCY DEPARTMENT Provider Note   CSN: [id number] Arrival date & time: 09/17/21  1910     History Chief Complaint  Patient presents with   Shoulder Pain    [name] is a 75 y.o. female.  Patient fell and hit her right shoulder.  Patient did not hit her head  The history is provided by the patient and medical records. No language interpreter was used.  Shoulder Pain Location:  Shoulder Shoulder location:  R shoulder Pain details:    Quality:  Aching and sharp   Radiates to:  Does not radiate   Severity:  Moderate   Onset quality:  Sudden   Timing:  Constant   Progression:  Worsening Dislocation: no   Tetanus status:  Unknown Relieved by:  Nothing Associated symptoms: no back pain and no fatigue       Past Medical History:  Diagnosis Date   Diabetes mellitus without complication ([place])    High cholesterol    Hypertension     Patient Active Problem List   Diagnosis Date Noted   Uncontrolled type 2 diabetes mellitus with hyperglycemia ([place]) 07/31/2020   Acute respiratory failure with hypoxia ([place]) 07/31/2020   Essential hypertension 55/97/4163   Acute metabolic encephalopathy 84/53/6468   Gastroesophageal reflux disease with esophagitis 06/01/2018   Diverticulosis 04/28/2018   Diabetes ([place]) 04/04/2018   History of adenomatous polyp of colon 04/04/2018   Hyperlipidemia 04/04/2018    Past Surgical History:  Procedure Laterality Date   FOOT SURGERY       OB History     Gravida      Para      Term      Preterm      AB      Living  2      SAB      IAB      Ectopic      Multiple      Live Births              History reviewed. No pertinent family history.  Social History   Tobacco Use   Smoking status: Never   Smokeless tobacco: Never  Vaping Use   Vaping Use: Never used  Substance Use Topics   Alcohol use: Never   Drug use: Never    Home Medications Prior to Admission medications   Medication Sig Start  Date End Date Taking? Authorizing Provider  HYDROcodone-acetaminophen (NORCO/VICODIN) 5-325 MG tablet Take 1 tablet by mouth every 6 (six) hours as needed for moderate pain. 09/17/21  Yes Milton Ferguson, MD  ACCU-CHEK AVIVA PLUS test strip 1 each by Other route 4 (four) times daily.  01/25/18   [provider]  ACCU-CHEK FASTCLIX LANCETS MISC U TO TEST QID 01/21/18   [provider]  ADMELOG SOLOSTAR 100 UNIT/ML KwikPen Inject into the skin. Patient not taking: Reported on 11/27/2020 08/07/20   [provider]  albuterol (PROVENTIL) (2.5 MG/3ML) 0.083% nebulizer solution Take by nebulization. 08/06/20   [provider]  albuterol (VENTOLIN HFA) 108 (90 Base) MCG/ACT inhaler Inhale into the lungs. 08/17/20   [provider]  Alogliptin Benzoate 12.5 MG TABS Take 12.5 mg by mouth daily.     [provider]  Alogliptin-metFORMIN HCl 12.5-500 MG TABS Take 1 tablet by mouth 2 (two) times daily. 08/05/20   [provider]  amLODipine (NORVASC) 2.5 MG tablet Take 2.5 mg by mouth daily. 08/06/20   [provider]  ascorbic acid (VITAMIN C)  500 MG tablet Take 1 tablet (500 mg total) by mouth daily. X 5 days 08/06/20   Orson Eva, MD  aspirin 325 MG tablet Take 325 mg by mouth daily.    [provider]  Calcium Citrate (CITRACAL PO) Take 1 tablet by mouth daily.     [provider]  Cholecalciferol (VITAMIN D PO) Take 1 tablet by mouth daily.     [provider]  escitalopram (LEXAPRO) 10 MG tablet Take 10 mg by mouth daily.  01/20/18   [provider]  fesoterodine (TOVIAZ) 8 MG TB24 tablet Take 8 mg by mouth daily.    [provider]  furosemide (LASIX) 10 MG/ML injection  08/06/20   [provider]  furosemide (LASIX) 20 MG tablet Take 20 mg by mouth daily as needed. 08/28/20   [provider]  glimepiride (AMARYL) 2 MG tablet Take 4 mg by mouth daily.  01/21/18   [provider]  hydrochlorothiazide (HYDRODIURIL) 25 MG tablet Take 25 mg by mouth daily.  01/21/18   [provider]  ipratropium-albuterol (DUONEB) 0.5-2.5 (3) MG/3ML SOLN Take 3 mLs by nebulization 4 (four) times daily. 08/28/20   [provider]  LANTUS SOLOSTAR 100 UNIT/ML Solostar Pen Inject into the skin. Patient not taking: Reported on 11/27/2020 08/06/20   [provider]  levothyroxine (SYNTHROID, LEVOTHROID) 100 MCG tablet Take 100 mcg by mouth daily.  01/21/18   [provider]  losartan (COZAAR) 100 MG tablet Take 100 mg by mouth daily.  11/17/17   [provider]  meloxicam (MOBIC) 15 MG tablet TAKE 1 TABLET(15 MG) BY MOUTH DAILY Patient taking differently: Take 15 mg by mouth daily.  07/15/20   Edrick Kins, DPM  metFORMIN (GLUCOPHAGE) 500 MG tablet Take 500 mg by mouth 2 (two) times daily with a meal.  12/31/17   [provider]  NOVOLOG 100 UNIT/ML injection Inject into the skin. Patient not taking: Reported on 11/27/2020 08/17/20   [provider]  omeprazole (PRILOSEC) 20 MG capsule Take 20 mg by mouth daily.  01/21/18   [provider]  oxybutynin (DITROPAN XL) 15 MG 24 hr tablet Take 15 mg by mouth daily.  01/21/18   [provider]  pantoprazole (PROTONIX) 40 MG tablet Take 40 mg by mouth daily. 09/02/20   [provider]  predniSONE (DELTASONE) 20 MG tablet Take 3 tablets (60 mg total) by mouth 2 (two) times daily with a meal. X 4 days 08/05/20   Tat, Shanon Brow, MD  simvastatin (ZOCOR) 40 MG tablet Take 40 mg by mouth daily.  01/21/18   [provider]  SOLU-MEDROL 125 MG injection SMARTSIG:1 IM Every Night 08/06/20   [provider]  zinc sulfate 220 (50 Zn) MG capsule Take 1 capsule (220 mg total) by mouth daily. X 5 days 08/06/20   Orson Eva, MD    Allergies    Patient has no known allergies.  Review of Systems   Review of Systems  Constitutional:  Negative for appetite  change and fatigue.  HENT:  Negative for congestion, ear discharge and sinus pressure.   Eyes:  Negative for discharge.  Respiratory:  Negative for cough.   Cardiovascular:  Negative for chest pain.  Gastrointestinal:  Negative for abdominal pain and diarrhea.  Genitourinary:  Negative for frequency and hematuria.  Musculoskeletal:  Negative for back pain.       Right shoulder pain  Skin:  Negative for rash.  Neurological:  Negative for  seizures and headaches.  Psychiatric/Behavioral:  Negative for hallucinations.    Physical Exam Updated Vital Signs BP (!) 209/81   Pulse 71   Temp 98.7 F (37.1 C) (Oral)   Resp 20   Ht 5' (1.524 m)   Wt 72.6 kg   SpO2 98%   BMI 31.25 kg/m   Physical Exam Vitals and nursing note reviewed.  Constitutional:      Appearance: She is well-developed.  HENT:     Head: Normocephalic.     Nose: Nose normal.  Eyes:     General: No scleral icterus.    Conjunctiva/sclera: Conjunctivae normal.  Neck:     Thyroid: No thyromegaly.  Cardiovascular:     Rate and Rhythm: Normal rate and regular rhythm.     Heart sounds: No murmur heard.   No friction rub. No gallop.  Pulmonary:     Breath sounds: No stridor. No wheezing or rales.  Chest:     Chest wall: No tenderness.  Abdominal:     General: There is no distension.     Tenderness: There is no abdominal tenderness. There is no rebound.  Musculoskeletal:     Cervical back: Neck supple.     Comments: Tenderness to right shoulder and clavicle  Lymphadenopathy:     Cervical: No cervical adenopathy.  Skin:    Findings: No erythema or rash.  Neurological:     Mental Status: She is alert and oriented to person, place, and time.     Motor: No abnormal muscle tone.     Coordination: Coordination normal.  Psychiatric:        Behavior: Behavior normal.    ED Results / Procedures / Treatments   Labs (all labs ordered are listed, but only abnormal results are displayed) Labs Reviewed - No data to  display  EKG None  Radiology DG Shoulder Right  Result Date: 09/17/2021 CLINICAL DATA:  Fall, injury, right shoulder pain EXAM: RIGHT SHOULDER - 2+ VIEW COMPARISON:  None. FINDINGS: There is an acute, mildly displaced mid-diaphyseal fracture of the right clavicle. Acromioclavicular and coracoclavicular alignment appears normal. Glenohumeral alignment is normal. Scapula and visualized proximal humerus are intact. Numerous surgical clips noted within the right axilla. IMPRESSION: Acute mildly displaced right clavicle mid-diaphyseal fracture. Electronically Signed   By: Fidela Salisbury M.D.   On: 09/17/2021 20:01    Procedures Procedures   Medications Ordered in ED Medications  losartan (COZAAR) tablet 100 mg (has no administration in time range)  HYDROcodone-acetaminophen (NORCO/VICODIN) 5-325 MG per tablet 1 tablet (has no administration in time range)    ED Course  I have reviewed the triage vital signs and the nursing notes.  Pertinent labs & imaging results that were available during my care of the patient were reviewed by me and considered in my medical decision making (see chart for details).    MDM Rules/Calculators/A&P                           Right clavicle fracture.  Patient given a sling and pain medicine will follow up with Ortho Final Clinical Impression(s) / ED Diagnoses Final diagnoses:  Closed displaced fracture of right clavicle, unspecified part of clavicle, initial encounter    Rx / DC Orders ED Discharge Orders          Ordered    HYDROcodone-acetaminophen (NORCO/VICODIN) 5-325 MG tablet  Every 6 hours PRN        09/17/21 2115  Milton Ferguson, MD 09/19/21 1137

## 2021-09-17 NOTE — Discharge Instructions (Signed)
Follow-up with Dr. Aline Brochure or one of his colleagues here in [place] next week

## 2021-09-22 ENCOUNTER — Other Ambulatory Visit: Payer: Self-pay

## 2021-09-22 ENCOUNTER — Ambulatory Visit (INDEPENDENT_AMBULATORY_CARE_PROVIDER_SITE_OTHER): Payer: Medicare Other | Admitting: Orthopedic Surgery

## 2021-09-22 ENCOUNTER — Ambulatory Visit: Payer: Medicare Other

## 2021-09-22 ENCOUNTER — Encounter: Payer: Self-pay | Admitting: Orthopedic Surgery

## 2021-09-22 VITALS — BP 176/79 | HR 74 | Ht 60.0 in | Wt 160.0 lb

## 2021-09-22 DIAGNOSIS — S42021A Displaced fracture of shaft of right clavicle, initial encounter for closed fracture: Secondary | ICD-10-CM | POA: Diagnosis not present

## 2021-09-22 MED ORDER — HYDROCODONE-ACETAMINOPHEN 7.5-325 MG PO TABS: 1.0000 | ORAL_TABLET | ORAL | 0 refills | Status: AC | PRN

## 2021-09-22 NOTE — Progress Notes (Signed)
EVALUATION AND MANAGEMENT   Type of appointment : new er f/u   PLAN: 75 year old female with displaced right clavicle fracture  I discussed with her treatment options of surgery versus nonoperative treatment with option to do surgery if the fracture does not heal  We both agreed to proceed with nonoperative care  X-ray in 4 weeks  Recommended stronger hydrocodone with precautions given    Meds ordered this encounter  Medications   HYDROcodone-acetaminophen (NORCO) 7.5-325 MG tablet    Sig: Take 1 tablet by mouth every 4 (four) hours as needed for up to 5 days for moderate pain.    Dispense:  30 tablet    Refill:  0      Chief Complaint  Patient presents with   Clavicle Injury    Right clavicle fracture 09/17/21    75 year old female who fell on December 7 injured her right shoulder.  She was getting food for the dog tipped over.  ER evaluation right shoulder x-ray shows a clavicle fracture she was placed in a sling and swathe and she is here for follow-up  ER records indicate she was given some hydrocodone for pain   Review of Systems  All other systems reviewed and are negative.   Body mass index is 31.25 kg/m.  Physical Exam Constitutional:      General: She is not in acute distress.    Appearance: She is well-developed.     Comments: Well developed, well nourished Normal grooming and hygiene     Cardiovascular:     Comments: No peripheral edema Musculoskeletal:     Comments: Right shoulder  Skin large bruise over the shoulder and into the chest.  Skin is intact.  Tenderness over the clavicle.  Range of motion deferred acuity  Instability test deferred acuity  Muscle tone normal hand motion and strength normal  Sensory exam normal.  Skin:    General: Skin is warm and dry.  Neurological:     Mental Status: She is alert and oriented to person, place, and time.     Sensory: No sensory deficit.     Coordination: Coordination normal.     Gait:  Gait normal.     Deep Tendon Reflexes: Reflexes are normal and symmetric.  Psychiatric:        Mood and Affect: Mood normal.        Behavior: Behavior normal.        Thought Content: Thought content normal.        Judgment: Judgment normal.     Comments: Affect normal     Past Medical History:  Diagnosis Date   Diabetes mellitus without complication (H[facility])    High cholesterol    Hypertension    Past Surgical History:  Procedure Laterality Date   FOOT SURGERY     Social History   Tobacco Use   Smoking status: Never   Smokeless tobacco: Never  Vaping Use   Vaping Use: Never used  Substance Use Topics   Alcohol use: Never   Drug use: Never     Assessment and Plan:  X-ray evaluation right shoulder x-rays were taken.  There is a midshaft clavicle fracture it has some slight comminution it does not look displaced  However to evaluate the fracture more clearly we will order a clavicle x-ray  The clavicle x-ray shows a displaced clavicle fracture  Encounter Diagnosis  Name Primary?   Closed displaced fracture of shaft of right clavicle, initial encounter Yes    Sling and  swathe x-ray 4 weeks

## 2021-09-24 DIAGNOSIS — Z1331 Encounter for screening for depression: Secondary | ICD-10-CM | POA: Diagnosis not present

## 2021-09-24 DIAGNOSIS — M1991 Primary osteoarthritis, unspecified site: Secondary | ICD-10-CM | POA: Diagnosis not present

## 2021-09-24 DIAGNOSIS — E039 Hypothyroidism, unspecified: Secondary | ICD-10-CM | POA: Diagnosis not present

## 2021-09-24 DIAGNOSIS — E6609 Other obesity due to excess calories: Secondary | ICD-10-CM | POA: Diagnosis not present

## 2021-09-24 DIAGNOSIS — E1129 Type 2 diabetes mellitus with other diabetic kidney complication: Secondary | ICD-10-CM | POA: Diagnosis not present

## 2021-09-24 DIAGNOSIS — F329 Major depressive disorder, single episode, unspecified: Secondary | ICD-10-CM | POA: Diagnosis not present

## 2021-09-24 DIAGNOSIS — E782 Mixed hyperlipidemia: Secondary | ICD-10-CM | POA: Diagnosis not present

## 2021-09-24 DIAGNOSIS — F419 Anxiety disorder, unspecified: Secondary | ICD-10-CM | POA: Diagnosis not present

## 2021-09-24 DIAGNOSIS — I10 Essential (primary) hypertension: Secondary | ICD-10-CM | POA: Diagnosis not present

## 2021-09-24 DIAGNOSIS — E7849 Other hyperlipidemia: Secondary | ICD-10-CM | POA: Diagnosis not present

## 2021-09-24 DIAGNOSIS — E1165 Type 2 diabetes mellitus with hyperglycemia: Secondary | ICD-10-CM | POA: Diagnosis not present

## 2021-09-24 DIAGNOSIS — Z6831 Body mass index (BMI) 31.0-31.9, adult: Secondary | ICD-10-CM | POA: Diagnosis not present

## 2021-10-10 DIAGNOSIS — I10 Essential (primary) hypertension: Secondary | ICD-10-CM | POA: Diagnosis not present

## 2021-10-10 DIAGNOSIS — E119 Type 2 diabetes mellitus without complications: Secondary | ICD-10-CM | POA: Diagnosis not present

## 2021-10-10 DIAGNOSIS — N181 Chronic kidney disease, stage 1: Secondary | ICD-10-CM | POA: Diagnosis not present

## 2021-10-10 DIAGNOSIS — E782 Mixed hyperlipidemia: Secondary | ICD-10-CM | POA: Diagnosis not present

## 2021-10-14 DIAGNOSIS — S42021A Displaced fracture of shaft of right clavicle, initial encounter for closed fracture: Secondary | ICD-10-CM | POA: Insufficient documentation

## 2021-10-20 ENCOUNTER — Other Ambulatory Visit: Payer: Self-pay

## 2021-10-20 ENCOUNTER — Ambulatory Visit: Payer: Medicare Other

## 2021-10-20 ENCOUNTER — Encounter: Payer: Self-pay | Admitting: Orthopedic Surgery

## 2021-10-20 ENCOUNTER — Ambulatory Visit (INDEPENDENT_AMBULATORY_CARE_PROVIDER_SITE_OTHER): Payer: Medicare Other | Admitting: Orthopedic Surgery

## 2021-10-20 DIAGNOSIS — M545 Low back pain, unspecified: Secondary | ICD-10-CM

## 2021-10-20 DIAGNOSIS — S42021D Displaced fracture of shaft of right clavicle, subsequent encounter for fracture with routine healing: Secondary | ICD-10-CM

## 2021-10-20 MED ORDER — IBUPROFEN 800 MG PO TABS: 800.0000 mg | ORAL_TABLET | Freq: Three times a day (TID) | ORAL | 1 refills | Status: DC | PRN

## 2021-10-20 NOTE — Progress Notes (Signed)
Fracture care follow-up  Right clavicle fracture  Date of first visit was 09/22/2021   Chief Complaint  Patient presents with   Fracture    Right Clavicle DOI 09/17/21   X-rays at 1 month fracture is essentially nondisplaced with no angulation there is comminution of the midshaft clavicle fracture  The skin is still bruised but there is no break in the skin she is neurovascularly intact  Recommend x-ray in 4 weeks  She did complain of some back pain she tried some Tylenol and heat  Exam shows there were some tenderness at the base of the thoracic spine over the vertebrae but no tenderness on either side of the spinous process  Recommend ibuprofen continue Tylenol and heat as needed

## 2021-11-12 ENCOUNTER — Other Ambulatory Visit: Payer: Self-pay

## 2021-11-12 ENCOUNTER — Encounter: Payer: Self-pay | Admitting: Urology

## 2021-11-12 ENCOUNTER — Ambulatory Visit (INDEPENDENT_AMBULATORY_CARE_PROVIDER_SITE_OTHER): Payer: Medicare Other | Admitting: Urology

## 2021-11-12 VITALS — BP 166/75 | HR 85 | Wt 160.0 lb

## 2021-11-12 DIAGNOSIS — R351 Nocturia: Secondary | ICD-10-CM | POA: Diagnosis not present

## 2021-11-12 DIAGNOSIS — N3281 Overactive bladder: Secondary | ICD-10-CM

## 2021-11-12 LAB — URINALYSIS, ROUTINE W REFLEX MICROSCOPIC
Bilirubin, UA: NEGATIVE
Glucose, UA: NEGATIVE
Ketones, UA: NEGATIVE
Leukocytes,UA: NEGATIVE
Nitrite, UA: NEGATIVE
Protein,UA: NEGATIVE
RBC, UA: NEGATIVE
Specific Gravity, UA: 1.02 (ref 1.005–1.030)
Urobilinogen, Ur: 1 mg/dL (ref 0.2–1.0)
pH, UA: 5.5 (ref 5.0–7.5)

## 2021-11-12 MED ORDER — FESOTERODINE FUMARATE ER 8 MG PO TB24: 8.0000 mg | ORAL_TABLET | Freq: Every day | ORAL | 11 refills | Status: DC

## 2021-11-12 NOTE — Addendum Note (Signed)
Addended byIris Pert on: 11/12/2021 02:32 PM   Modules accepted: Orders

## 2021-11-12 NOTE — Addendum Note (Signed)
Addended byIris Pert on: 11/12/2021 01:18 PM   Modules accepted: Orders

## 2021-11-12 NOTE — Progress Notes (Signed)
11/12/2021 11:34 AM   Tyrell Antonio 1946-07-21 [id number]  Referring provider: Redmond School, MD [address] [place],  [place] [address]  Followup OAB   HPI: Ms Gosser is a 76yo here for evaluation of OAb and nocturia. She is currently on toviaz 8mg  daily. She has nocturia 4x which are medium size volumes. She drinks 8oz of Diet Dr. Malachi Bonds within 2 hours of going to bed. She wears depends and uses 1-2 per day. Daytime frequency is every 2-3 hours. Urine stream strong. No feeling of incomplete emptying.   Her records from AUs are as follows:  I leak when I have the urge to urinate. HPI: Victorina Kable is a 76 year-old female established patient who is here for urge incontinence.??She does not have problems getting to the bathroom in time after she has the urge to urinate. Her urge incontinence began approximately 04/11/2018. Her symptoms have gotten worse over the last year. ??She does wear protective pads. She wears 1-2 pads per day. She generally urinates every 3 hours in the daytime. She gets up at night to urinate 2 times. She is not having problems with emptying her bladder well. ??11/07/2018: For the past 6 months she has noticed worsening urgency and urge incontinence. She uses 5 pads per pay. She was on oxybutynin 5mg  for several years and then recently she was started on 15mg  ER. No isseus with constipation. She has not been on any other OAB meds. ?She has DMII and A1c is 6.9. ??12/16/2018: The mirabegron 25mg  failed to improve her urgency and urge incontinence. Nocturia unimproved ??01/27/2019: Incontinence resolved with toviaz 8mg  ??08/04/2019: Incontinence resolved. Nocturia improved to 2x. No issue swith dry mouth or constipation ??CC: I get up too often at night to urinate. HPI: She first noticed the symptom approximately 01/11/2016. She usually gets up at night to urinate 2 times. She does not have nights when she does not get up to urinate at all. She does not have trouble falling back asleep  once she has been woken up at night. ??She does not usually have swelling in her hands and feet during the day. She does not take a diuretic. She does not have to strain or bear down to start her urinary stream. ??01/27/2019: toviaz improved nocturia to 2-3x from 6-8x. ??08/04/2019: Nocturia improved to 2x since last visit. ?    PMH: Past Medical History:  Diagnosis Date   Diabetes mellitus without complication ([place])    High cholesterol    Hypertension     Surgical History: Past Surgical History:  Procedure Laterality Date   FOOT SURGERY      Home Medications:  Allergies as of 11/12/2021   No Known Allergies      Medication List        Accurate as of November 12, 2021 11:34 AM. If you have any questions, ask your nurse or doctor.          STOP taking these medications    Alogliptin Benzoate 12.5 MG Tabs Stopped by: Nicolette Bang, MD   meloxicam 15 MG tablet Commonly known as: MOBIC Stopped by: Nicolette Bang, MD   predniSONE 20 MG tablet Commonly known as: DELTASONE Stopped by: Nicolette Bang, MD       TAKE these medications    Accu-Chek Aviva Plus test strip Generic drug: glucose blood 1 each by Other route 4 (four) times daily.   Accu-Chek FastClix Lancets Misc U TO TEST QID   amLODipine 2.5 MG tablet Commonly known as: NORVASC  Take 2.5 mg by mouth daily.   ascorbic acid 500 MG tablet Commonly known as: VITAMIN C Take 1 tablet (500 mg total) by mouth daily. X 5 days   aspirin 325 MG tablet Take 325 mg by mouth daily.   BIOTIN PO Take by mouth.   calcium-vitamin D 500-5 MG-MCG tablet Commonly known as: OSCAL WITH D Take 1 tablet by mouth.   CITRACAL PO Take 1 tablet by mouth daily.   co-enzyme Q-10 30 MG capsule Take 30 mg by mouth 3 (three) times daily.   ELDERBERRY PO Take by mouth.   escitalopram 10 MG tablet Commonly known as: LEXAPRO Take 10 mg by mouth daily.   FISH OIL PO Take by mouth.   furosemide 10 MG/ML  injection Commonly known as: LASIX   furosemide 20 MG tablet Commonly known as: LASIX Take 20 mg by mouth daily as needed.   glimepiride 2 MG tablet Commonly known as: AMARYL Take 4 mg by mouth daily.   hydrochlorothiazide 25 MG tablet Commonly known as: HYDRODIURIL Take 25 mg by mouth daily.   ibuprofen 800 MG tablet Commonly known as: ADVIL Take 1 tablet (800 mg total) by mouth every 8 (eight) hours as needed.   Janumet 50-500 MG tablet Generic drug: sitaGLIPtin-metformin Take 1 tablet by mouth 2 (two) times daily.   levothyroxine 100 MCG tablet Commonly known as: SYNTHROID Take 100 mcg by mouth daily.   losartan 100 MG tablet Commonly known as: COZAAR Take 100 mg by mouth daily.   omeprazole 20 MG capsule Commonly known as: PRILOSEC Take 20 mg by mouth daily.   pantoprazole 40 MG tablet Commonly known as: PROTONIX Take 40 mg by mouth daily.   simvastatin 40 MG tablet Commonly known as: ZOCOR Take 40 mg by mouth daily.   Toviaz 8 MG Tb24 tablet Generic drug: fesoterodine Take 8 mg by mouth daily.   VITAMIN B 12 PO Take by mouth.   VITAMIN D PO Take 1 tablet by mouth daily.   zinc sulfate 220 (50 Zn) MG capsule Take 1 capsule (220 mg total) by mouth daily. X 5 days        Allergies: No Known Allergies  Family History: No family history on file.  Social History:  reports that she has never smoked. She has never used smokeless tobacco. She reports that she does not drink alcohol and does not use drugs.  ROS: All other review of systems were reviewed and are negative except what is noted above in HPI  Physical Exam: BP (!) 166/75    Pulse 85    Wt 160 lb (72.6 kg)    BMI 31.25 kg/m   Constitutional:  Alert and oriented, No acute distress. HEENT: [place] AT, moist mucus membranes.  Trachea midline, no masses. Cardiovascular: No clubbing, cyanosis, or edema. Respiratory: Normal respiratory effort, no increased work of breathing. GI: Abdomen is soft,  nontender, nondistended, no abdominal masses GU: No CVA tenderness.  Lymph: No cervical or inguinal lymphadenopathy. Skin: No rashes, bruises or suspicious lesions. Neurologic: Grossly intact, no focal deficits, moving all 4 extremities. Psychiatric: Normal mood and affect.  Laboratory Data: Lab Results  Component Value Date   WBC 11.2 (H) 08/04/2020   HGB 13.8 08/04/2020   HCT 42.4 08/04/2020   MCV 89.8 08/04/2020   PLT 364 08/04/2020    Lab Results  Component Value Date   CREATININE 0.56 08/04/2020    No results found for: PSA  No results found for: TESTOSTERONE  Lab  Results  Component Value Date   HGBA1C 8.7 (H) 07/30/2020    Urinalysis No results found for: COLORURINE, APPEARANCEUR, LABSPEC, PHURINE, GLUCOSEU, HGBUR, BILIRUBINUR, KETONESUR, PROTEINUR, UROBILINOGEN, NITRITE, LEUKOCYTESUR  No results found for: LABMICR, [place], RBCUA, LABEPIT, MUCUS, BACTERIA  Pertinent Imaging:  No results found for this or any previous visit.  No results found for this or any previous visit.  No results found for this or any previous visit.  No results found for this or any previous visit.  No results found for this or any previous visit.  No results found for this or any previous visit.  No results found for this or any previous visit.  No results found for this or any previous visit.   Assessment & Plan:    1. OAB (overactive bladder) -Continue toviaz 8mg  daily   2. Nocturia -Stop caffeine and sodas after 2pm.    No follow-ups on file.  Nicolette Bang, MD  [facility] [place]

## 2021-11-12 NOTE — Progress Notes (Signed)
Urological Symptom Review  Patient is experiencing the following symptoms: Frequent urination Get up at night to urinate Leakage of urine   Review of Systems  Gastrointestinal (upper)  : Negative for upper GI symptoms  Gastrointestinal (lower) : Negative for lower GI symptoms  Constitutional : Negative for symptoms  Skin: Negative for skin symptoms  Eyes: Negative for eye symptoms  Ear/Nose/Throat : Negative for Ear/Nose/Throat symptoms  Hematologic/Lymphatic: Negative for Hematologic/Lymphatic symptoms  Cardiovascular : Negative for cardiovascular symptoms  Respiratory : Negative for respiratory symptoms  Endocrine: Negative for endocrine symptoms  Musculoskeletal: Negative for musculoskeletal symptoms  Neurological: Negative for neurological symptoms  Psychologic: Negative for psychiatric symptoms

## 2021-11-12 NOTE — Patient Instructions (Signed)

## 2021-11-17 ENCOUNTER — Ambulatory Visit: Payer: Medicare Other

## 2021-11-17 ENCOUNTER — Other Ambulatory Visit: Payer: Self-pay

## 2021-11-17 ENCOUNTER — Ambulatory Visit (INDEPENDENT_AMBULATORY_CARE_PROVIDER_SITE_OTHER): Payer: Medicare Other | Admitting: Orthopedic Surgery

## 2021-11-17 ENCOUNTER — Encounter: Payer: Self-pay | Admitting: Orthopedic Surgery

## 2021-11-17 DIAGNOSIS — S42021D Displaced fracture of shaft of right clavicle, subsequent encounter for fracture with routine healing: Secondary | ICD-10-CM | POA: Diagnosis not present

## 2021-11-17 NOTE — Progress Notes (Signed)
Chief Complaint  Patient presents with   Shoulder Injury    Right clavicle fracture 09/17/21 improvin g   Encounter Diagnosis  Name Primary?   Closed displaced fracture of shaft of right clavicle with routine healing, subsequent encounter 09/17/21 Yes    The patient is comfortable in the sling  Her x-ray shows the fracture is healing with good callus formation no displacement or angulation  She can remove the sling and wear just for comfort  She can drive in 2 weeks  X-ray clavicle in 4 weeks

## 2021-11-18 DIAGNOSIS — Z6831 Body mass index (BMI) 31.0-31.9, adult: Secondary | ICD-10-CM | POA: Diagnosis not present

## 2021-11-18 DIAGNOSIS — J329 Chronic sinusitis, unspecified: Secondary | ICD-10-CM | POA: Diagnosis not present

## 2021-11-18 DIAGNOSIS — J029 Acute pharyngitis, unspecified: Secondary | ICD-10-CM | POA: Diagnosis not present

## 2021-12-15 ENCOUNTER — Ambulatory Visit (INDEPENDENT_AMBULATORY_CARE_PROVIDER_SITE_OTHER): Payer: Medicare Other | Admitting: Orthopedic Surgery

## 2021-12-15 ENCOUNTER — Ambulatory Visit: Payer: Medicare Other

## 2021-12-15 ENCOUNTER — Other Ambulatory Visit: Payer: Self-pay

## 2021-12-15 DIAGNOSIS — S42021D Displaced fracture of shaft of right clavicle, subsequent encounter for fracture with routine healing: Secondary | ICD-10-CM | POA: Diagnosis not present

## 2021-12-15 NOTE — Progress Notes (Signed)
Chief Complaint  ?Patient presents with  ? fracture care  ?  RT clavicle/ closed displaced fracture of shaft of RT clavicle ?DOI 09/17/21  ? ? ?Encounter Diagnosis  ?Name Primary?  ? Closed displaced fracture of shaft of right clavicle with routine healing, subsequent encounter Yes  ? ? ?[name] is right at 12 weeks after her midshaft clavicle fracture she complains of some tenderness to palpation at the fracture site but her range of motion has nearly improved to normal ? ?Her x-ray looks good ? ?Recommend gradual return to normal activities I do not think she needs physical therapy ? ?Follow-up as needed ?

## 2021-12-18 ENCOUNTER — Ambulatory Visit (INDEPENDENT_AMBULATORY_CARE_PROVIDER_SITE_OTHER): Payer: Medicare Other

## 2021-12-18 ENCOUNTER — Other Ambulatory Visit: Payer: Self-pay

## 2021-12-18 ENCOUNTER — Ambulatory Visit
Admission: EM | Admit: 2021-12-18 | Discharge: 2021-12-18 | Disposition: A | Discharge status: Home / Self Care | Payer: Medicare Other | Attending: Urgent Care | Admitting: Urgent Care

## 2021-12-18 DIAGNOSIS — A084 Viral intestinal infection, unspecified: Secondary | ICD-10-CM | POA: Diagnosis not present

## 2021-12-18 DIAGNOSIS — R059 Cough, unspecified: Secondary | ICD-10-CM

## 2021-12-18 DIAGNOSIS — R197 Diarrhea, unspecified: Secondary | ICD-10-CM | POA: Diagnosis not present

## 2021-12-18 DIAGNOSIS — R053 Chronic cough: Secondary | ICD-10-CM | POA: Diagnosis not present

## 2021-12-18 DIAGNOSIS — R112 Nausea with vomiting, unspecified: Secondary | ICD-10-CM

## 2021-12-18 DIAGNOSIS — R9389 Abnormal findings on diagnostic imaging of other specified body structures: Secondary | ICD-10-CM

## 2021-12-18 MED ORDER — ONDANSETRON 8 MG PO TBDP: 8.0000 mg | ORAL_TABLET | Freq: Three times a day (TID) | ORAL | 0 refills | Status: DC | PRN

## 2021-12-18 MED ORDER — LOPERAMIDE HCL 2 MG PO CAPS: 2.0000 mg | ORAL_CAPSULE | Freq: Two times a day (BID) | ORAL | 0 refills | Status: DC | PRN

## 2021-12-18 MED ORDER — PROMETHAZINE-DM 6.25-15 MG/5ML PO SYRP
5.0000 mL | ORAL_SOLUTION | Freq: Every evening | ORAL | 0 refills | Status: DC | PRN
Start: 2021-12-18 — End: 2023-10-13

## 2021-12-18 MED ORDER — CETIRIZINE HCL 10 MG PO TABS: 10.0000 mg | ORAL_TABLET | Freq: Every day | ORAL | 0 refills | Status: DC

## 2021-12-18 NOTE — Discharge Instructions (Addendum)

## 2021-12-18 NOTE — ED Triage Notes (Signed)
Pt reports diarrhea and vomiting since last nigh; cough at morning and nights x 3 months.  ?

## 2021-12-18 NOTE — ED Provider Notes (Signed)
?[place] ? ? ?MRN: [id number] DOB: [birth year] ? ?Subjective:  ? ?[name] is a 76 y.o. female presenting for 1 day history of acute onset persistent nausea with vomiting and diarrhea.  Patient reports that she has had too many episodes to keep count up.  No bloody stools, hematemesis.  No history of ulcerative colitis, Crohn disease, IBS.  She does have a history of diverticulosis but has never caused her any issues.  She is also had a chronic and persistent cough worse in the morning and at night for the past 3 months.  No chest pain, shortness of breath or wheezing.  She is not a smoker.  No history of asthma.  Reports that her diabetes is well controlled, treated without insulin.  She has not followed up with her regular doctor in 3 months.  Plans on doing so. ? ?No current facility-administered medications for this encounter. ? ?Current Outpatient Medications:  ?  ACCU-CHEK AVIVA PLUS test strip, 1 each by Other route 4 (four) times daily. , Disp: , Rfl: 5 ?  ACCU-CHEK FASTCLIX LANCETS MISC, U TO TEST QID, Disp: , Rfl: 10 ?  amLODipine (NORVASC) 2.5 MG tablet, Take 2.5 mg by mouth daily., Disp: , Rfl:  ?  ascorbic acid (VITAMIN C) 500 MG tablet, Take 1 tablet (500 mg total) by mouth daily. X 5 days, Disp: , Rfl:  ?  aspirin 325 MG tablet, Take 325 mg by mouth daily., Disp: , Rfl:  ?  BIOTIN PO, Take by mouth., Disp: , Rfl:  ?  Calcium Citrate (CITRACAL PO), Take 1 tablet by mouth daily. , Disp: , Rfl:  ?  calcium-vitamin D (OSCAL WITH D) 500-5 MG-MCG tablet, Take 1 tablet by mouth., Disp: , Rfl:  ?  Cholecalciferol (VITAMIN D PO), Take 1 tablet by mouth daily. , Disp: , Rfl:  ?  co-enzyme Q-10 30 MG capsule, Take 30 mg by mouth 3 (three) times daily., Disp: , Rfl:  ?  Cyanocobalamin (VITAMIN B 12 PO), Take by mouth., Disp: , Rfl:  ?  ELDERBERRY PO, Take by mouth., Disp: , Rfl:  ?  escitalopram (LEXAPRO) 10 MG tablet, Take 10 mg by mouth daily. , Disp: , Rfl: 1 ?  esomeprazole (NEXIUM) 20  MG capsule, Take 20 mg by mouth daily at 12 noon., Disp: , Rfl:  ?  fesoterodine (TOVIAZ) 8 MG TB24 tablet, Take 1 tablet (8 mg total) by mouth daily., Disp: 30 tablet, Rfl: 11 ?  furosemide (LASIX) 10 MG/ML injection, , Disp: , Rfl:  ?  furosemide (LASIX) 20 MG tablet, Take 20 mg by mouth daily as needed., Disp: , Rfl:  ?  glimepiride (AMARYL) 2 MG tablet, Take 4 mg by mouth daily. , Disp: , Rfl: 1 ?  hydrochlorothiazide (HYDRODIURIL) 25 MG tablet, Take 25 mg by mouth daily. , Disp: , Rfl: 3 ?  ibuprofen (ADVIL) 800 MG tablet, Take 1 tablet (800 mg total) by mouth every 8 (eight) hours as needed., Disp: 90 tablet, Rfl: 1 ?  JANUMET 50-500 MG tablet, Take 1 tablet by mouth 2 (two) times daily., Disp: , Rfl:  ?  levothyroxine (SYNTHROID, LEVOTHROID) 100 MCG tablet, Take 100 mcg by mouth daily. , Disp: , Rfl: 3 ?  losartan (COZAAR) 100 MG tablet, Take 100 mg by mouth daily. , Disp: , Rfl: 0 ?  Omega-3 Fatty Acids (FISH OIL PO), Take by mouth., Disp: , Rfl:  ?  pantoprazole (PROTONIX) 40 MG tablet, Take 40 mg by  mouth daily., Disp: , Rfl:  ?  simvastatin (ZOCOR) 40 MG tablet, Take 40 mg by mouth daily. , Disp: , Rfl: 3 ?  zinc sulfate 220 (50 Zn) MG capsule, Take 1 capsule (220 mg total) by mouth daily. X 5 days, Disp: , Rfl:   ? ?No Known Allergies ? ?Past Medical History:  ?Diagnosis Date  ? Diabetes mellitus without complication ([place])   ? High cholesterol   ? Hypertension   ?  ? ?Past Surgical History:  ?Procedure Laterality Date  ? FOOT SURGERY    ? ? ?History reviewed. No pertinent family history. ? ?Social History  ? ?Tobacco Use  ? Smoking status: Never  ? Smokeless tobacco: Never  ?Vaping Use  ? Vaping Use: Never used  ?Substance Use Topics  ? Alcohol use: Never  ? Drug use: Never  ? ? ?ROS ? ? ?Objective:  ? ?Vitals: ?BP (!) 145/77 (BP Location: Right Arm)   Pulse 74   Temp 98.9 ?F (37.2 ?C) (Oral)   Resp 18   SpO2 95%  ? ?Physical Exam ?Constitutional:   ?   General: She is not in acute distress. ?    Appearance: Normal appearance. She is well-developed. She is not ill-appearing, toxic-appearing or diaphoretic.  ?HENT:  ?   Head: Normocephalic and atraumatic.  ?   Nose: Nose normal.  ?   Mouth/Throat:  ?   Mouth: Mucous membranes are moist.  ?Eyes:  ?   General: No scleral icterus.    ?   Right eye: No discharge.     ?   Left eye: No discharge.  ?   Extraocular Movements: Extraocular movements intact.  ?   Conjunctiva/sclera: Conjunctivae normal.  ?Cardiovascular:  ?   Rate and Rhythm: Normal rate.  ?   Heart sounds: No murmur heard. ?  No friction rub. No gallop.  ?Pulmonary:  ?   Effort: Pulmonary effort is normal. No respiratory distress.  ?   Breath sounds: No stridor. No wheezing, rhonchi or rales.  ?Chest:  ?   Chest wall: No tenderness.  ?Abdominal:  ?   General: There is no distension.  ?   Palpations: Abdomen is soft. There is no mass.  ?   Tenderness: There is no abdominal tenderness. There is no right CVA tenderness, left CVA tenderness, guarding or rebound.  ?   Comments: Hyperactive bowel sounds.  ?Skin: ?   General: Skin is warm and dry.  ?Neurological:  ?   General: No focal deficit present.  ?   Mental Status: She is alert and oriented to person, place, and time.  ?Psychiatric:     ?   Mood and Affect: Mood normal.     ?   Behavior: Behavior normal.     ?   Thought Content: Thought content normal.     ?   Judgment: Judgment normal.  ? ? ?DG Chest 2 View ? ?Result Date: 12/18/2021 ?CLINICAL DATA:  Cough. EXAM: CHEST - 2 VIEW COMPARISON:  Chest x-ray 09/25/2020. FINDINGS: There is some minimal strandy opacities in the left lung base. There is no lung consolidation, pleural effusion or pneumothorax. Cardiomediastinal silhouette is within normal limits. Right axillary surgical clips are again seen. No acute fractures are identified. IMPRESSION: 1. Minimal strandy opacities in the left lung base favored is atelectasis. No focal lung consolidation. Electronically Signed   By: Ronney Asters M.D.   On:  12/18/2021 16:44   ? ? ?Assessment and Plan :  ? ?  PDMP not reviewed this encounter. ? ?1. Viral gastroenteritis   ?2. Nausea vomiting and diarrhea   ?3. Persistent cough   ? ?Will manage for suspected viral gastroenteritis with supportive care.  Recommended patient hydrate well, eat light meals and maintain electrolytes.  Will use Zofran and Imodium for nausea, vomiting and diarrhea.  Regarding her cough, will defer antibiotic use given no focal lung consolidation.  Recommended conservative management with an antihistamine, cough medication.  Follow-up closely with PCP for recheck on the cough and consideration for repeat chest x-ray.  Counseled patient on potential for adverse effects with medications prescribed/recommended today, ER and return-to-clinic precautions discussed, patient verbalized understanding. ? ?  ?Jaynee Eagles, PA-C ?12/18/21 1658 ? ?

## 2021-12-22 DIAGNOSIS — Z20822 Contact with and (suspected) exposure to covid-19: Secondary | ICD-10-CM | POA: Diagnosis not present

## 2021-12-30 DIAGNOSIS — Z6827 Body mass index (BMI) 27.0-27.9, adult: Secondary | ICD-10-CM | POA: Diagnosis not present

## 2021-12-30 DIAGNOSIS — J069 Acute upper respiratory infection, unspecified: Secondary | ICD-10-CM | POA: Diagnosis not present

## 2021-12-30 DIAGNOSIS — E663 Overweight: Secondary | ICD-10-CM | POA: Diagnosis not present

## 2021-12-30 DIAGNOSIS — J40 Bronchitis, not specified as acute or chronic: Secondary | ICD-10-CM | POA: Diagnosis not present

## 2022-01-10 DIAGNOSIS — Z20822 Contact with and (suspected) exposure to covid-19: Secondary | ICD-10-CM | POA: Diagnosis not present

## 2022-01-20 DIAGNOSIS — Z20822 Contact with and (suspected) exposure to covid-19: Secondary | ICD-10-CM | POA: Diagnosis not present

## 2022-01-29 DIAGNOSIS — I10 Essential (primary) hypertension: Secondary | ICD-10-CM | POA: Diagnosis not present

## 2022-01-29 DIAGNOSIS — F329 Major depressive disorder, single episode, unspecified: Secondary | ICD-10-CM | POA: Diagnosis not present

## 2022-01-29 DIAGNOSIS — Z6827 Body mass index (BMI) 27.0-27.9, adult: Secondary | ICD-10-CM | POA: Diagnosis not present

## 2022-01-29 DIAGNOSIS — M1991 Primary osteoarthritis, unspecified site: Secondary | ICD-10-CM | POA: Diagnosis not present

## 2022-01-29 DIAGNOSIS — E782 Mixed hyperlipidemia: Secondary | ICD-10-CM | POA: Diagnosis not present

## 2022-01-29 DIAGNOSIS — F419 Anxiety disorder, unspecified: Secondary | ICD-10-CM | POA: Diagnosis not present

## 2022-01-29 DIAGNOSIS — E1129 Type 2 diabetes mellitus with other diabetic kidney complication: Secondary | ICD-10-CM | POA: Diagnosis not present

## 2022-01-29 DIAGNOSIS — E1165 Type 2 diabetes mellitus with hyperglycemia: Secondary | ICD-10-CM | POA: Diagnosis not present

## 2022-01-29 DIAGNOSIS — E039 Hypothyroidism, unspecified: Secondary | ICD-10-CM | POA: Diagnosis not present

## 2022-02-03 ENCOUNTER — Ambulatory Visit: Payer: Medicare Other | Admitting: Urology

## 2022-02-04 DIAGNOSIS — Z20822 Contact with and (suspected) exposure to covid-19: Secondary | ICD-10-CM | POA: Diagnosis not present

## 2022-02-06 DIAGNOSIS — Z20822 Contact with and (suspected) exposure to covid-19: Secondary | ICD-10-CM | POA: Diagnosis not present

## 2022-02-09 DIAGNOSIS — Z20822 Contact with and (suspected) exposure to covid-19: Secondary | ICD-10-CM | POA: Diagnosis not present

## 2022-02-09 DIAGNOSIS — R051 Acute cough: Secondary | ICD-10-CM | POA: Diagnosis not present

## 2022-02-09 DIAGNOSIS — R059 Cough, unspecified: Secondary | ICD-10-CM | POA: Diagnosis not present

## 2022-02-10 DIAGNOSIS — Z20822 Contact with and (suspected) exposure to covid-19: Secondary | ICD-10-CM | POA: Diagnosis not present

## 2022-02-11 DIAGNOSIS — Z20822 Contact with and (suspected) exposure to covid-19: Secondary | ICD-10-CM | POA: Diagnosis not present

## 2022-02-12 DIAGNOSIS — Z20822 Contact with and (suspected) exposure to covid-19: Secondary | ICD-10-CM | POA: Diagnosis not present

## 2022-02-16 DIAGNOSIS — Z20822 Contact with and (suspected) exposure to covid-19: Secondary | ICD-10-CM | POA: Diagnosis not present

## 2022-02-18 DIAGNOSIS — Z20822 Contact with and (suspected) exposure to covid-19: Secondary | ICD-10-CM | POA: Diagnosis not present

## 2022-02-19 DIAGNOSIS — Z20822 Contact with and (suspected) exposure to covid-19: Secondary | ICD-10-CM | POA: Diagnosis not present

## 2022-03-25 DIAGNOSIS — Z1231 Encounter for screening mammogram for malignant neoplasm of breast: Secondary | ICD-10-CM | POA: Diagnosis not present

## 2022-07-08 IMAGING — DX DG CHEST 2V
2 series · 2 of 2 positions shown · non-contrast
Comparison: 4771

CLINICAL DATA: Hypoxemia

EXAM:
CHEST - 2 VIEW

[chest lat]
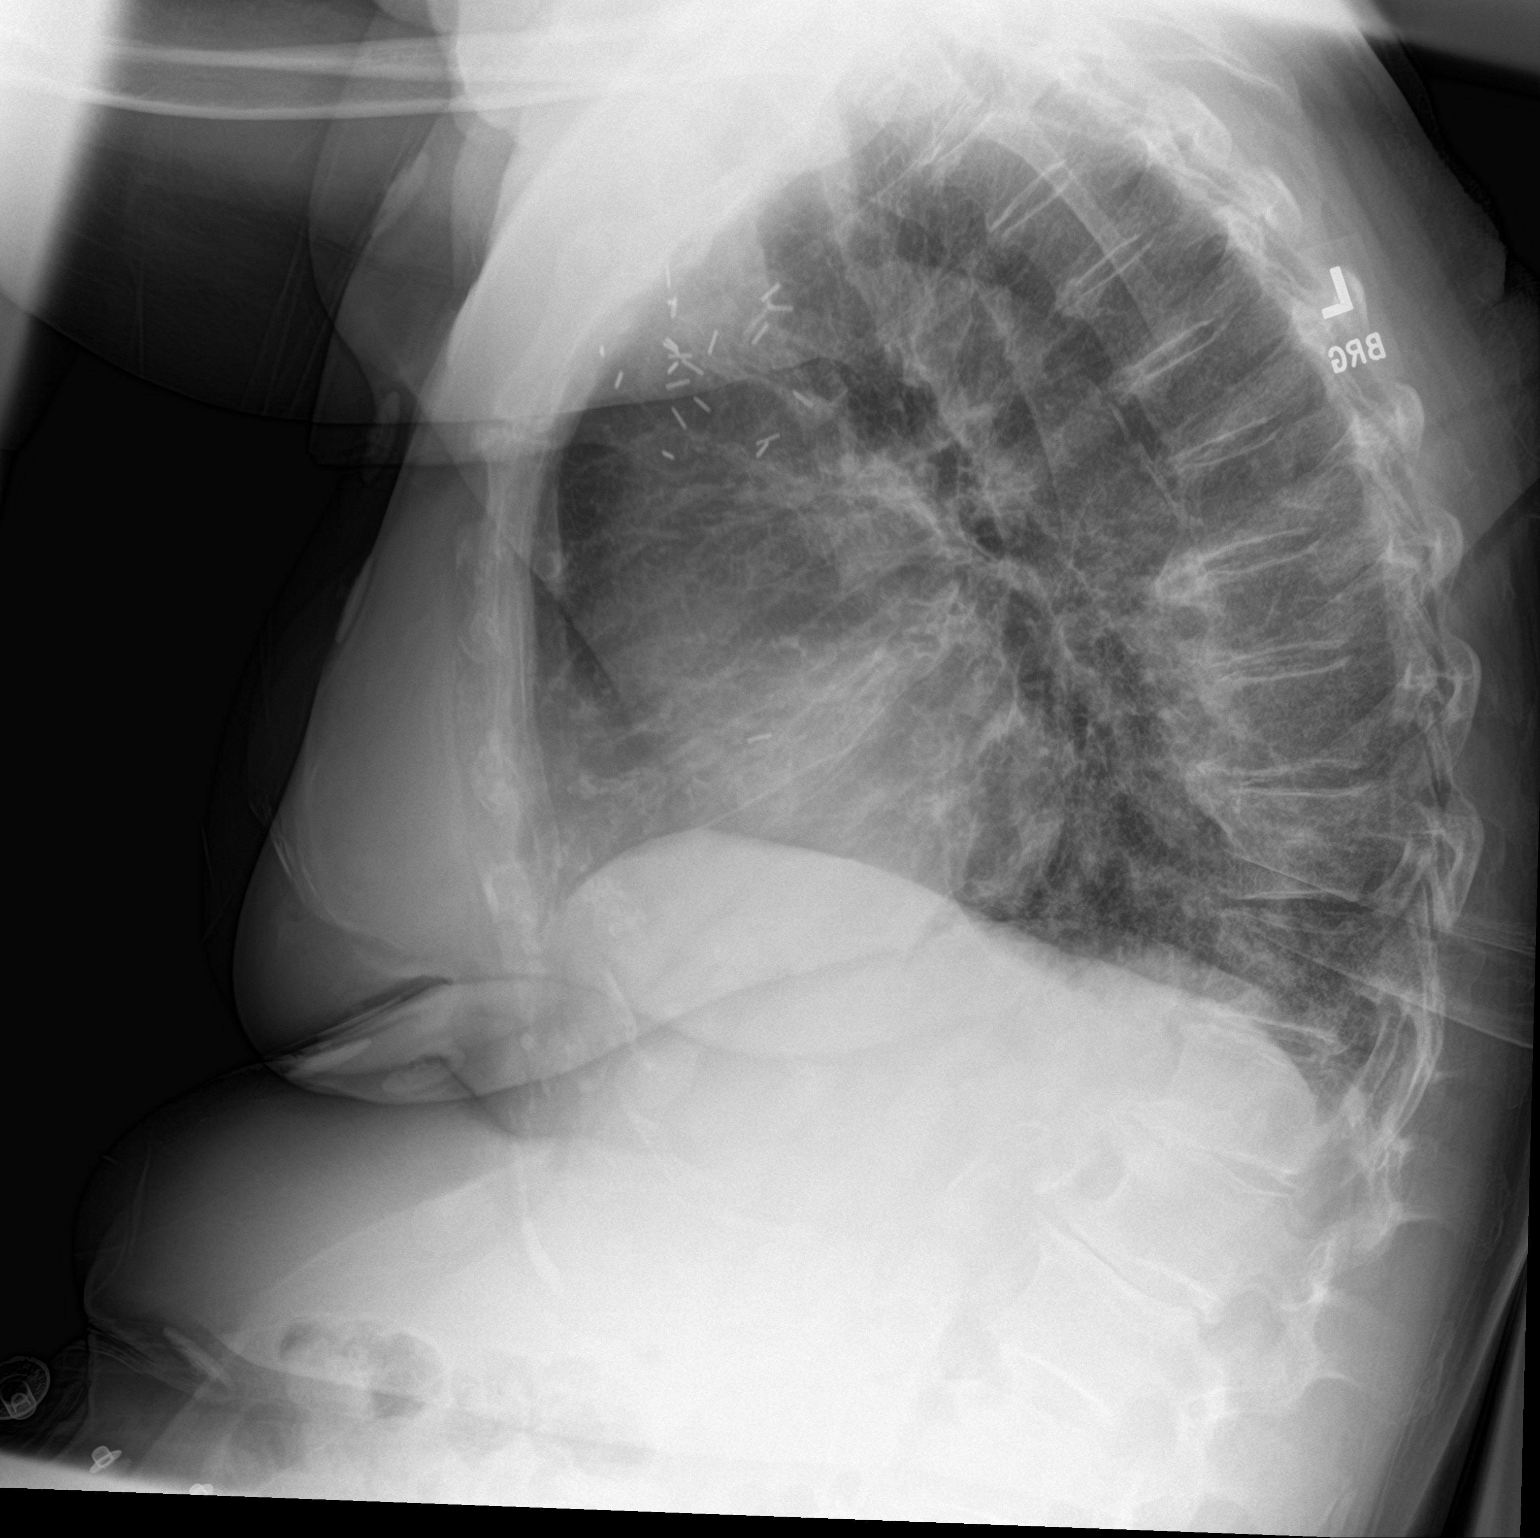

[chest ap]
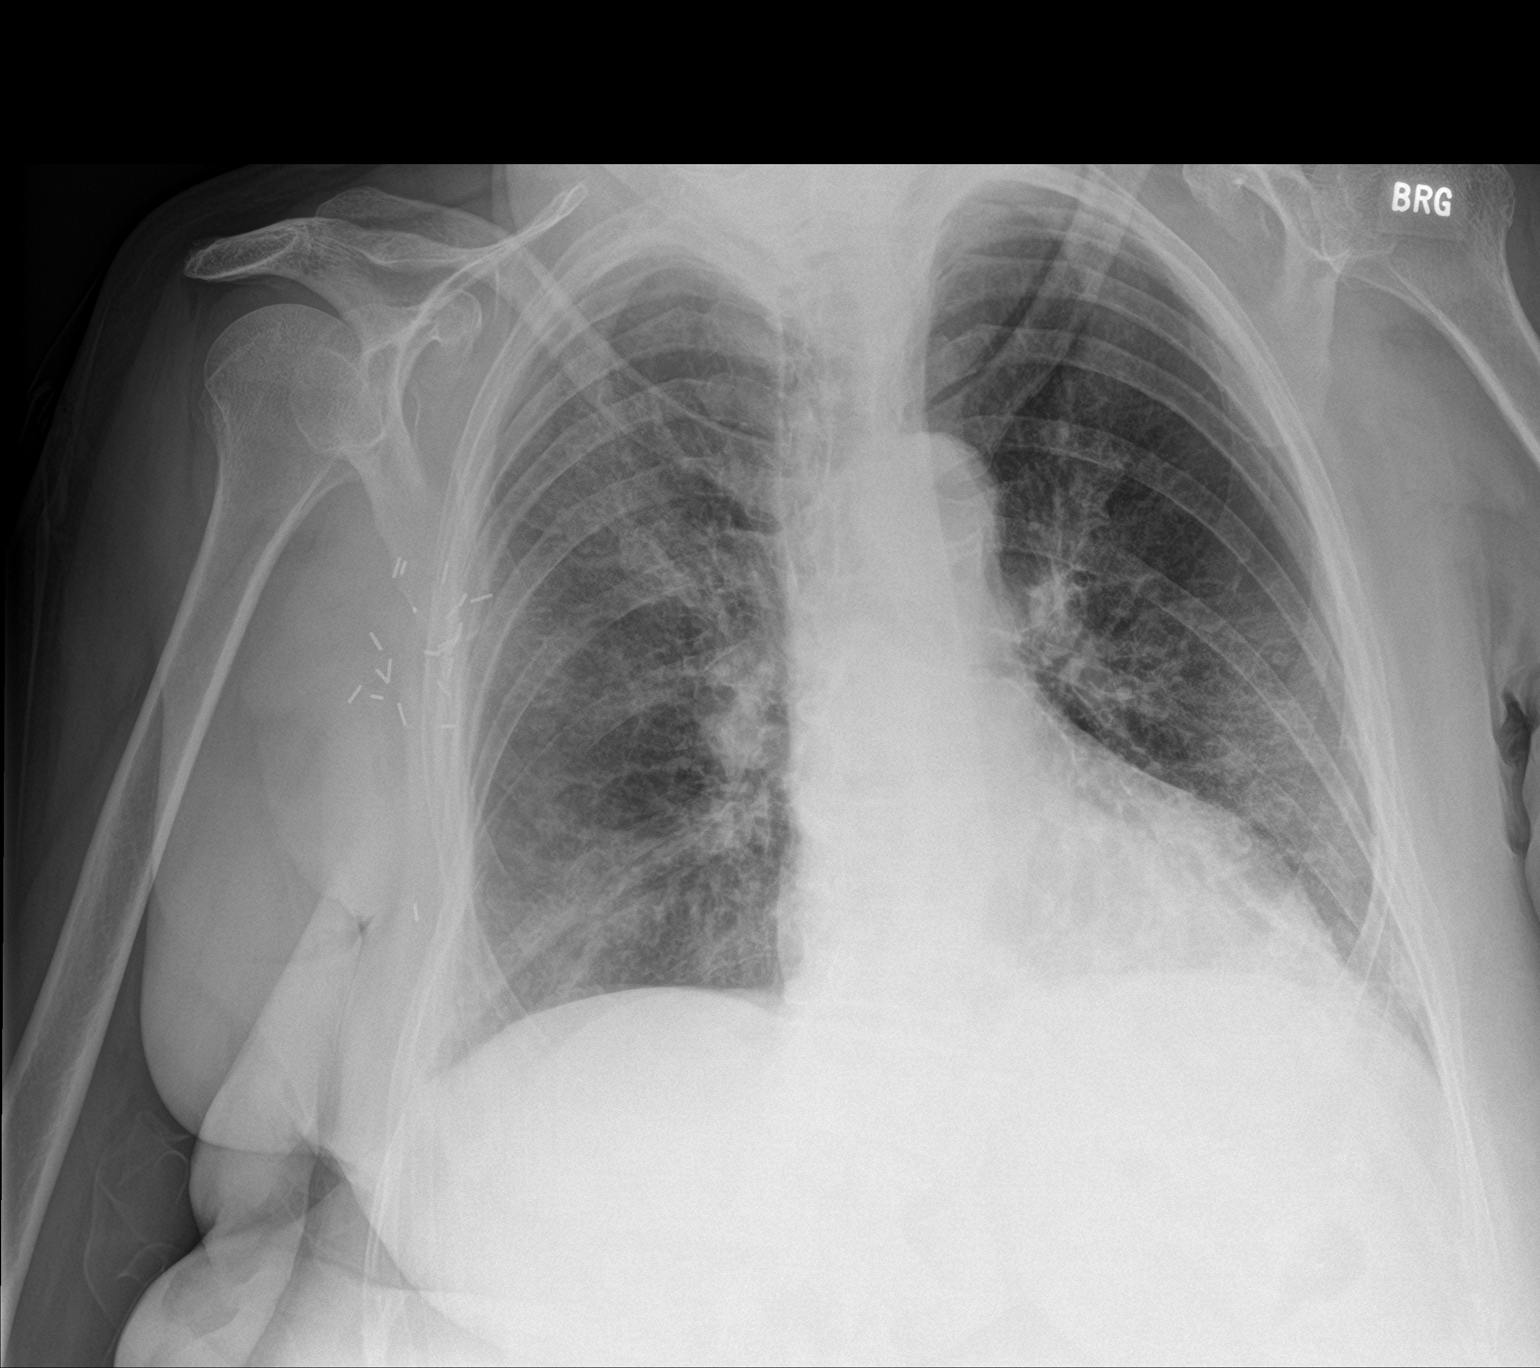

[2 of 2 positions shown; findings below may reference images not displayed]

FINDINGS: There is interstitial prominence with patchy ill-defined density
bilaterally at the lung bases primarily. No large pleural effusion.
No pneumothorax. Cardiomediastinal contours are within normal
limits. No acute osseous abnormality. Surgical clips overlie the
right chest wall.
IMPRESSION: Interstitial prominence and patchy ill-defined density at the lung
bases, which may reflect edema or atypical/viral pneumonia.

## 2022-07-09 IMAGING — MR MR MRA HEAD W/O CM
2 series · 48 of 48 positions shown · non-contrast
Comparison: Noncontrast head CT 07/30/2020.

CLINICAL DATA: Neuro deficit, acute, stroke suspected; right lower
extremity weakness, COVID pneumonia.

EXAM:
MRI HEAD WITHOUT CONTRAST
MRA HEAD WITHOUT CONTRAST
TECHNIQUE: Multiplanar, multiecho pulse sequences of the brain and surrounding
structures were obtained without intravenous contrast. Angiographic
images of the head were obtained using MRA technique without
contrast.

[Series 1: TOF · axial · 0.5mm · 0.76mm/px · z∈[-17,+74]mm · 37 of 200 slices shown]
[im 1/200]
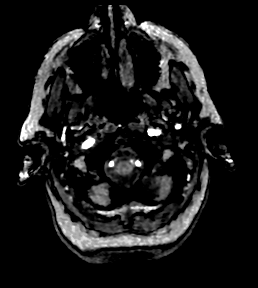
[im 6/200]
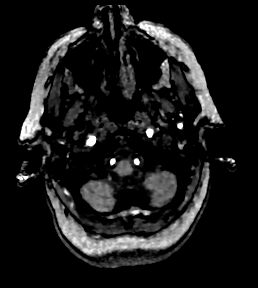
[im 12/200]
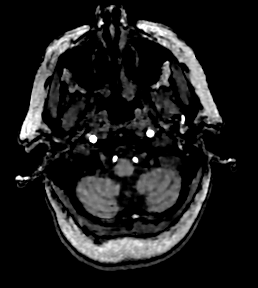
[im 17/200]
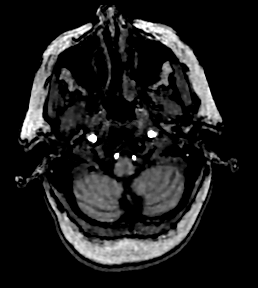
[im 23/200]
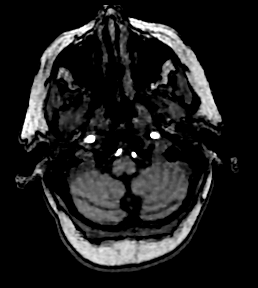
[im 28/200]
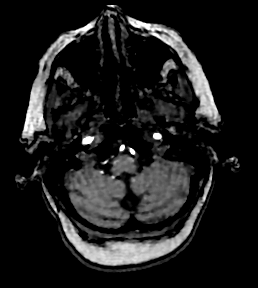
[im 34/200]
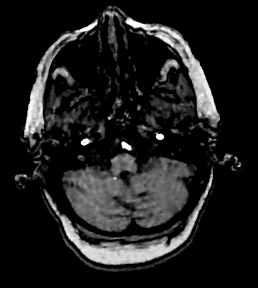
[im 39/200]
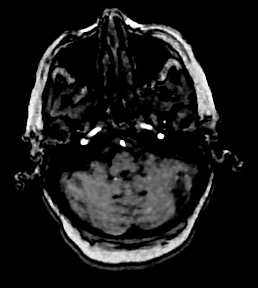
[im 45/200]
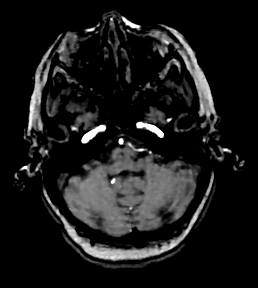
[im 50/200]
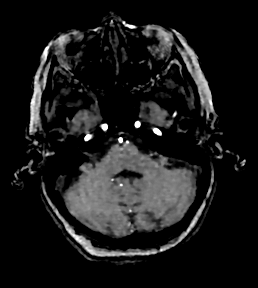
[im 56/200]
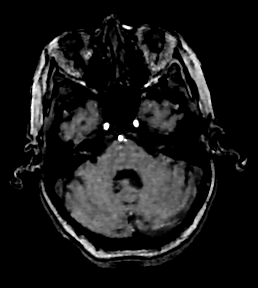
[im 61/200]
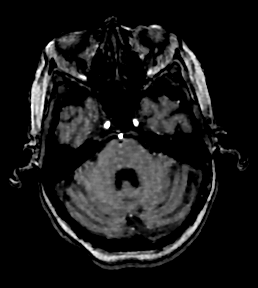
[im 67/200]
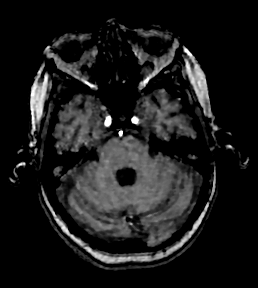
[im 72/200]
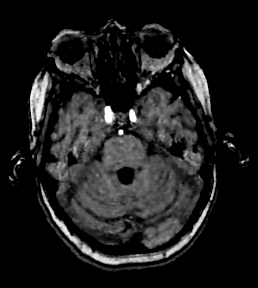
[im 78/200]
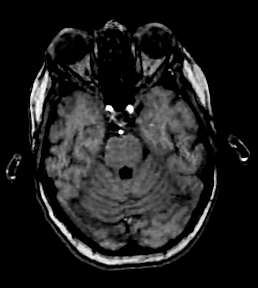
[im 83/200]
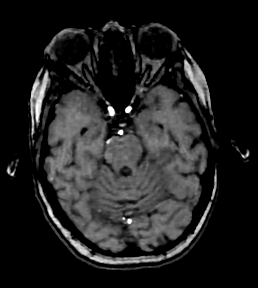
[im 89/200]
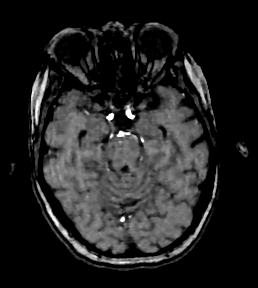
[im 94/200]
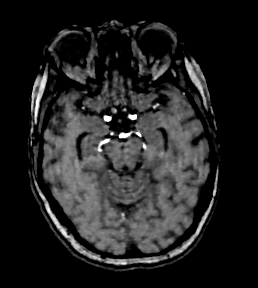
[im 100/200]
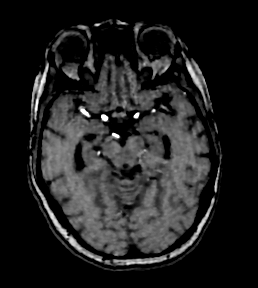
[im 106/200]
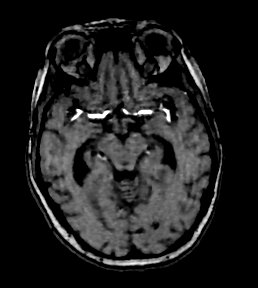
[im 111/200]
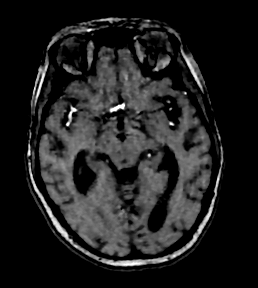
[im 117/200]
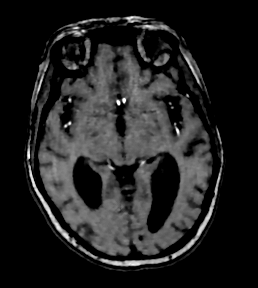
[im 122/200]
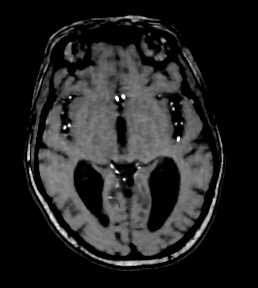
[im 128/200]
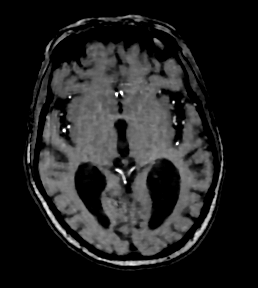
[im 133/200]
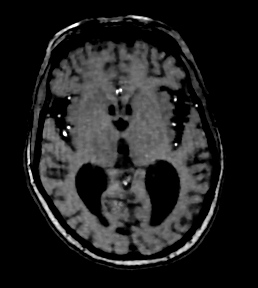
[im 139/200]
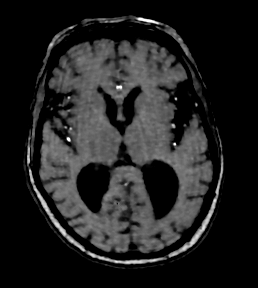
[im 144/200]
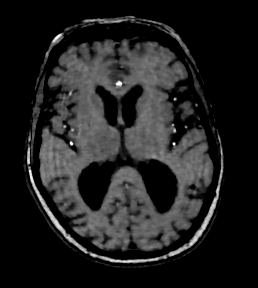
[im 150/200]
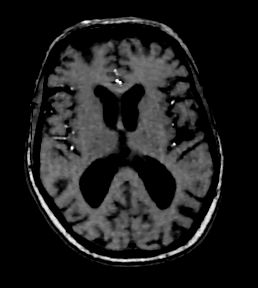
[im 155/200]
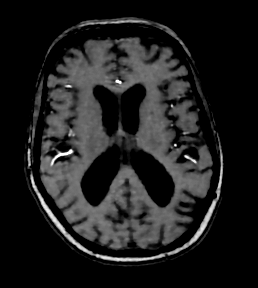
[im 161/200]
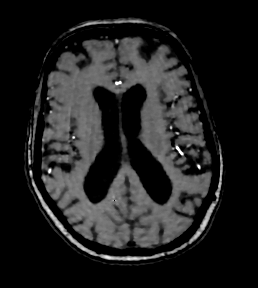
[im 166/200]
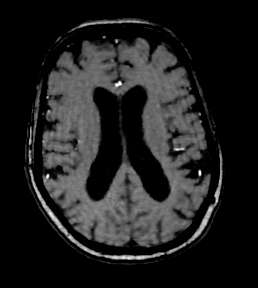
[im 172/200]
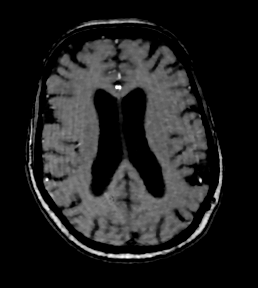
[im 177/200]
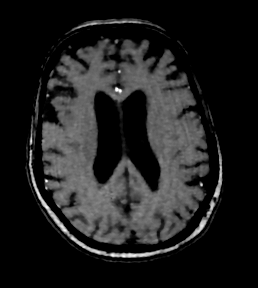
[im 183/200]
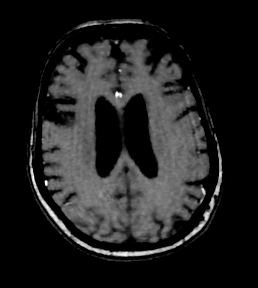
[im 188/200]
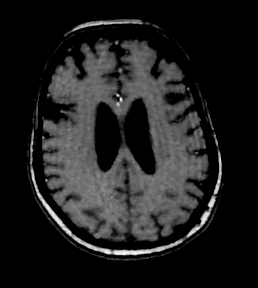
[im 194/200]
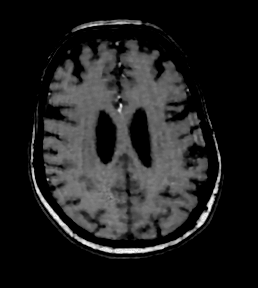
[im 200/200]
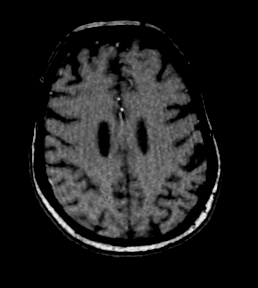

[Series 6: T1 · axial · non-contrast · 3.0mm · 0.90mm/px · z∈[-40,+133]mm · 11 of 60 slices shown]
[im 1/60]
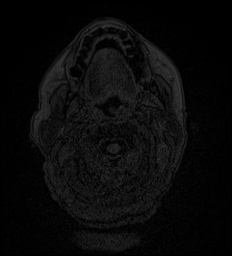
[im 6/60]
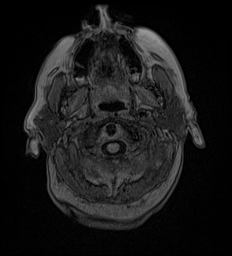
[im 12/60]
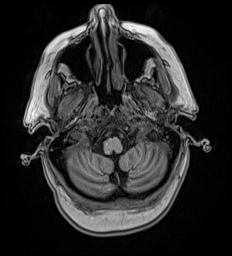
[im 18/60]
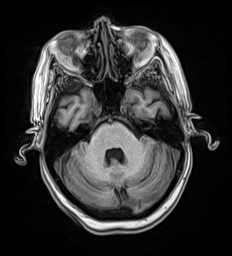
[im 24/60]
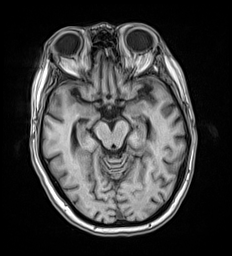
[im 30/60]
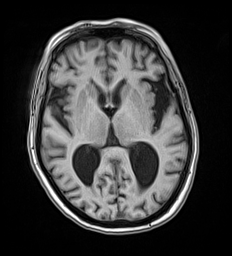
[im 36/60]
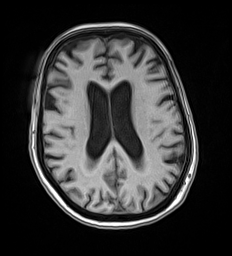
[im 42/60]
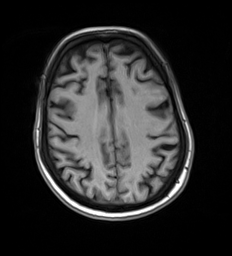
[im 48/60]
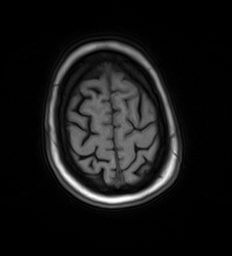
[im 54/60]
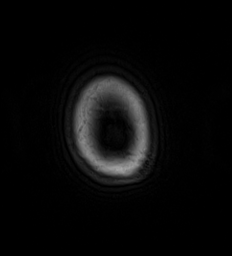
[im 60/60]
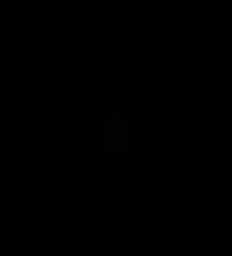

[48 of 48 positions shown; findings below may reference images not displayed]

FINDINGS: MRI HEAD FINDINGS

Brain:

Mild generalized cerebral atrophy with associated prominence of
ventricles and sulci.

No significant white matter disease for age.

There is no acute infarct.

No evidence of intracranial mass.

No chronic intracranial blood products.

No extra-axial fluid collection.

No midline shift.

Vascular: Reported below.

Skull and upper cervical spine: No focal marrow lesion.

Sinuses/Orbits: Visualized orbits show no acute finding. Minimal
ethmoid sinus mucosal thickening. Trace fluid within the bilateral
mastoid air cells.

MRA HEAD FINDINGS

The intracranial internal carotid arteries are patent. The M1 middle
cerebral arteries are patent without significant stenosis. No M2
proximal branch occlusion. Atherosclerotic irregularity of M2 and
more distal MCA branch vessels bilaterally. Most notably, there is a
moderate focal stenosis within a proximal superior division left M2
MCA branch vessel. Apparent high-grade stenosis within a mid to
distal M2 right MCA branch vessel. The anterior cerebral arteries
are patent.

The intracranial vertebral arteries are patent. The basilar artery
is patent. The posterior cerebral arteries are patent.
Atherosclerotic irregularity of the P2 and P3 posterior cerebral
arteries bilaterally with sites of apparent high-grade stenosis.
Posterior communicating arteries are hypoplastic or absent
bilaterally.

No intracranial aneurysm is identified.
IMPRESSION: MRI brain:

1. No evidence of acute intracranial abnormality, including acute
infarction.
2. Mild generalized cerebral atrophy.
3. Minimal ethmoid sinus mucosal thickening.
4. Trace bilateral mastoid effusions.

MRA head:

1. No intracranial large vessel occlusion.
2. Intracranial atherosclerotic disease with multifocal stenoses,
most notably as follows.
3. Moderate focal stenosis within a proximal superior division left
M2 MCA branch vessel.
4. Apparent high-grade stenosis within a mid to distal M2 right MCA
branch vessel.
5. Sites of high-grade stenosis within the P2 and P3 posterior
cerebral arteries bilaterally.

## 2022-07-13 DIAGNOSIS — H35033 Hypertensive retinopathy, bilateral: Secondary | ICD-10-CM | POA: Diagnosis not present

## 2022-07-13 DIAGNOSIS — E119 Type 2 diabetes mellitus without complications: Secondary | ICD-10-CM | POA: Diagnosis not present

## 2022-07-13 DIAGNOSIS — Z961 Presence of intraocular lens: Secondary | ICD-10-CM | POA: Diagnosis not present

## 2022-07-13 DIAGNOSIS — H43813 Vitreous degeneration, bilateral: Secondary | ICD-10-CM | POA: Diagnosis not present

## 2022-07-14 DIAGNOSIS — Z6827 Body mass index (BMI) 27.0-27.9, adult: Secondary | ICD-10-CM | POA: Diagnosis not present

## 2022-07-14 DIAGNOSIS — Z0001 Encounter for general adult medical examination with abnormal findings: Secondary | ICD-10-CM | POA: Diagnosis not present

## 2022-07-14 DIAGNOSIS — E782 Mixed hyperlipidemia: Secondary | ICD-10-CM | POA: Diagnosis not present

## 2022-07-14 DIAGNOSIS — F419 Anxiety disorder, unspecified: Secondary | ICD-10-CM | POA: Diagnosis not present

## 2022-07-14 DIAGNOSIS — F329 Major depressive disorder, single episode, unspecified: Secondary | ICD-10-CM | POA: Diagnosis not present

## 2022-07-14 DIAGNOSIS — E1165 Type 2 diabetes mellitus with hyperglycemia: Secondary | ICD-10-CM | POA: Diagnosis not present

## 2022-07-14 DIAGNOSIS — Z1331 Encounter for screening for depression: Secondary | ICD-10-CM | POA: Diagnosis not present

## 2022-07-14 DIAGNOSIS — E663 Overweight: Secondary | ICD-10-CM | POA: Diagnosis not present

## 2022-07-14 DIAGNOSIS — E039 Hypothyroidism, unspecified: Secondary | ICD-10-CM | POA: Diagnosis not present

## 2022-07-14 DIAGNOSIS — Z23 Encounter for immunization: Secondary | ICD-10-CM | POA: Diagnosis not present

## 2022-07-14 DIAGNOSIS — E119 Type 2 diabetes mellitus without complications: Secondary | ICD-10-CM | POA: Diagnosis not present

## 2022-07-14 DIAGNOSIS — I10 Essential (primary) hypertension: Secondary | ICD-10-CM | POA: Diagnosis not present

## 2022-07-14 DIAGNOSIS — K219 Gastro-esophageal reflux disease without esophagitis: Secondary | ICD-10-CM | POA: Diagnosis not present

## 2022-07-14 DIAGNOSIS — E7849 Other hyperlipidemia: Secondary | ICD-10-CM | POA: Diagnosis not present

## 2022-08-01 ENCOUNTER — Other Ambulatory Visit: Payer: Self-pay

## 2022-08-01 ENCOUNTER — Emergency Department (HOSPITAL_COMMUNITY)
Admission: EM | Admit: 2022-08-01 | Discharge: 2022-08-01 | Disposition: A | Discharge status: Home / Self Care | Payer: Medicare Other | Attending: Emergency Medicine | Admitting: Emergency Medicine

## 2022-08-01 ENCOUNTER — Encounter (HOSPITAL_COMMUNITY): Payer: Self-pay

## 2022-08-01 DIAGNOSIS — E119 Type 2 diabetes mellitus without complications: Secondary | ICD-10-CM | POA: Diagnosis not present

## 2022-08-01 DIAGNOSIS — I10 Essential (primary) hypertension: Secondary | ICD-10-CM | POA: Diagnosis not present

## 2022-08-01 DIAGNOSIS — M79641 Pain in right hand: Secondary | ICD-10-CM | POA: Diagnosis present

## 2022-08-01 DIAGNOSIS — L03113 Cellulitis of right upper limb: Secondary | ICD-10-CM | POA: Diagnosis not present

## 2022-08-01 MED ORDER — CEPHALEXIN 500 MG PO CAPS: 500.0000 mg | ORAL_CAPSULE | Freq: Four times a day (QID) | ORAL | 0 refills | Status: AC

## 2022-08-01 MED ORDER — LIDOCAINE HCL (PF) 1 % IJ SOLN
2.0000 mL | Freq: Once | INTRAMUSCULAR | Status: AC
  Administered 2022-08-01: 2 mL
  Filled 2022-08-01: qty 5

## 2022-08-01 MED ORDER — ACETAMINOPHEN 500 MG PO TABS
1000.0000 mg | ORAL_TABLET | Freq: Once | ORAL | Status: AC
  Administered 2022-08-01: 1000 mg via ORAL
  Filled 2022-08-01: qty 2

## 2022-08-01 MED ORDER — CEFTRIAXONE SODIUM 1 G IJ SOLR
1.0000 g | Freq: Once | INTRAMUSCULAR | Status: AC
  Administered 2022-08-01: 1 g via INTRAMUSCULAR
  Filled 2022-08-01: qty 10

## 2022-08-01 NOTE — ED Provider Notes (Signed)
[facility] EMERGENCY DEPARTMENT Provider Note  CSN: [id number] Arrival date & time: 08/01/22 2129  Chief Complaint(s) hand swelling  HPI [name] is a 76 y.o. female with a history of diabetes, hypertension presenting to the emergency department for right hand pain.  She denies trauma to her hand, denies any bites or injuries.  She noticed some redness and warmth to her hand.  Has not had any fevers or chills at home.  No nausea or vomiting.  No numbness or tingling.   Past Medical History Past Medical History:  Diagnosis Date   Diabetes mellitus without complication ([place])    High cholesterol    Hypertension    Patient Active Problem List   Diagnosis Date Noted   Closed displaced fracture of shaft of right clavicle 10/14/2021   Uncontrolled type 2 diabetes mellitus with hyperglycemia ([place]) 07/31/2020   Acute respiratory failure with hypoxia ([place]) 07/31/2020   Essential hypertension 40/98/1191   Acute metabolic encephalopathy 47/82/9562   Gastroesophageal reflux disease with esophagitis 06/01/2018   Diverticulosis 04/28/2018   Diabetes ([place]) 04/04/2018   History of adenomatous polyp of colon 04/04/2018   Hyperlipidemia 04/04/2018   Home Medication(s) Prior to Admission medications   Medication Sig Start Date End Date Taking? Authorizing Provider  cephALEXin (KEFLEX) 500 MG capsule Take 1 capsule (500 mg total) by mouth 4 (four) times daily for 7 days. 08/01/22 08/08/22 Yes Cristie Hem, MD  ACCU-CHEK AVIVA PLUS test strip 1 each by Other route 4 (four) times daily.  01/25/18   [provider]  ACCU-CHEK FASTCLIX LANCETS MISC U TO TEST QID 01/21/18   [provider]  amLODipine (NORVASC) 2.5 MG tablet Take 2.5 mg by mouth daily. 08/06/20   [provider]  ascorbic acid (VITAMIN C) 500 MG tablet Take 1 tablet (500 mg total) by mouth daily. X 5 days 08/06/20   Orson Eva, MD  aspirin 325 MG tablet Take 325 mg by mouth daily.    [provider]  BIOTIN PO Take by mouth.    [provider]  Calcium Citrate (CITRACAL PO) Take 1 tablet by mouth daily.     [provider]  calcium-vitamin D (OSCAL WITH D) 500-5 MG-MCG tablet Take 1 tablet by mouth.    [provider]  cetirizine (ZYRTEC ALLERGY) 10 MG tablet Take 1 tablet (10 mg total) by mouth daily. 12/18/21   Jaynee Eagles, PA-C  Cholecalciferol (VITAMIN D PO) Take 1 tablet by mouth daily.     [provider]  co-enzyme Q-10 30 MG capsule Take 30 mg by mouth 3 (three) times daily.    [provider]  Cyanocobalamin (VITAMIN B 12 PO) Take by mouth.    [provider]  ELDERBERRY PO Take by mouth.    [provider]  escitalopram (LEXAPRO) 10 MG tablet Take 10 mg by mouth daily.  01/20/18   [provider]  esomeprazole (NEXIUM) 20 MG capsule Take 20 mg by mouth daily at 12 noon.    [provider]  fesoterodine (TOVIAZ) 8 MG TB24 tablet Take 1 tablet (8 mg total) by mouth daily. 11/12/21   McKenzie, Candee Furbish, MD  furosemide (LASIX) 10 MG/ML injection  08/06/20   [provider]  furosemide (LASIX) 20 MG tablet Take 20 mg by mouth daily as needed. 08/28/20   [provider]  glimepiride (AMARYL) 2 MG tablet Take 4 mg by mouth daily.  01/21/18   [provider]  hydrochlorothiazide (HYDRODIURIL)  25 MG tablet Take 25 mg by mouth daily.  01/21/18   [provider]  ibuprofen (ADVIL) 800 MG tablet Take 1 tablet (800 mg total) by mouth every 8 (eight) hours as needed. 10/20/21   Carole Civil, MD  JANUMET 50-500 MG tablet Take 1 tablet by mouth 2 (two) times daily. 09/25/21   [provider]  levothyroxine (SYNTHROID, LEVOTHROID) 100 MCG tablet Take 100 mcg by mouth daily.  01/21/18   [provider]  loperamide (IMODIUM) 2 MG capsule Take 1 capsule (2 mg total) by mouth 2 (two) times daily as needed for diarrhea or loose stools. 12/18/21   Jaynee Eagles,  PA-C  losartan (COZAAR) 100 MG tablet Take 100 mg by mouth daily.  11/17/17   [provider]  Omega-3 Fatty Acids (FISH OIL PO) Take by mouth.    [provider]  ondansetron (ZOFRAN-ODT) 8 MG disintegrating tablet Take 1 tablet (8 mg total) by mouth every 8 (eight) hours as needed for nausea or vomiting. 12/18/21   Jaynee Eagles, PA-C  pantoprazole (PROTONIX) 40 MG tablet Take 40 mg by mouth daily. 09/02/20   [provider]  promethazine-dextromethorphan (PROMETHAZINE-DM) 6.25-15 MG/5ML syrup Take 5 mLs by mouth at bedtime as needed for cough. 12/18/21   Jaynee Eagles, PA-C  simvastatin (ZOCOR) 40 MG tablet Take 40 mg by mouth daily.  01/21/18   [provider]  zinc sulfate 220 (50 Zn) MG capsule Take 1 capsule (220 mg total) by mouth daily. X 5 days 08/06/20   Orson Eva, MD                                                                                                                                    Past Surgical History Past Surgical History:  Procedure Laterality Date   FOOT SURGERY     Family History History reviewed. No pertinent family history.  Social History Social History   Tobacco Use   Smoking status: Never   Smokeless tobacco: Never  Vaping Use   Vaping Use: Never used  Substance Use Topics   Alcohol use: Never   Drug use: Never   Allergies Patient has no known allergies.  Review of Systems Review of Systems  All other systems reviewed and are negative.   Physical Exam Vital Signs  I have reviewed the triage vital signs BP (!) 180/65   Pulse 77   Temp 100.1 F (37.8 C) (Oral)   Resp 18   Ht 5' (1.524 m)   Wt 68 kg   SpO2 97%   BMI 29.29 kg/m  Physical Exam Vitals and nursing note reviewed.  Constitutional:      General: She is not in acute distress.    Appearance: She is well-developed.  HENT:     Head: Normocephalic and atraumatic.     Mouth/Throat:     Mouth: Mucous membranes are moist.  Eyes:  Pupils:  Pupils are equal, round, and reactive to light.  Cardiovascular:     Rate and Rhythm: Normal rate and regular rhythm.     Heart sounds: No murmur heard. Pulmonary:     Effort: Pulmonary effort is normal. No respiratory distress.     Breath sounds: Normal breath sounds.  Abdominal:     General: Abdomen is flat.     Palpations: Abdomen is soft.     Tenderness: There is no abdominal tenderness.  Musculoskeletal:        General: No tenderness.     Right lower leg: No edema.     Left lower leg: No edema.     Comments: Able to make a fist easily of the right hand without significant pain.  No tenderness over flexor sheaths.  No spreading erythema to the palm or the palmar aspect of the digits  Skin:    General: Skin is warm and dry.     Comments: Small area of erythema over the dorsum of the hand around the second and third digits without fluctuance or induration mild warmth and tenderness. No skin wound  Neurological:     General: No focal deficit present.     Mental Status: She is alert. Mental status is at baseline.  Psychiatric:        Mood and Affect: Mood normal.        Behavior: Behavior normal.     ED Results and Treatments Labs (all labs ordered are listed, but only abnormal results are displayed) Labs Reviewed - No data to display                                                                                                                        Radiology No results found.  Pertinent labs & imaging results that were available during my care of the patient were reviewed by me and considered in my medical decision making (see MDM for details).  Medications Ordered in ED Medications  lidocaine (PF) (XYLOCAINE) 1 % injection 2 mL (has no administration in time range)  acetaminophen (TYLENOL) tablet 1,000 mg (1,000 mg Oral Given 08/01/22 2228)  cefTRIAXone (ROCEPHIN) injection 1 g (1 g Intramuscular Given 08/01/22 2228)  Procedures Procedures  (including critical care time)  Medical Decision Making / ED Course   MDM:  76 year old female presenting to the emergency department with hand pain.  Patient well-appearing, physical exam notable for area of erythema and warmth, tenderness to the dorsum of the right hand.  Vital signs notable for hypertension, patient asymptomatic without headache, chest pain, abdominal pain, vision changes.  Patient also has borderline elevated temperature of 37.8 but no true fever.  No tachycardia or hypotension.  Exam most consistent with cellulitis.  Will give dose of ceftriaxone and started on Keflex.  No evidence of wound or animal bite to suggest need to cover for alternative pathogen.  No signs of purulence to suggest staphylococcal infection.  No signs of flexor tenosynovitis.  Able to range hand without difficulty, doubt deep space hand infection.  No crepitus to suggest necrotizing infection.  Advise close follow-up with primary physician for wound recheck.  Demarcated area and advised to return if symptoms worsen. Will discharge patient to home. All questions answered. Patient comfortable with plan of discharge. Return precautions discussed with patient and specified on the after visit summary.       Additional history obtained: -External records from outside source obtained and reviewed including: Chart review including previous notes, labs, imaging, consultation notes including ED visit 12/18/21 for viral gastroenteritis  Medicines ordered and prescription drug management: Meds ordered this encounter  Medications   acetaminophen (TYLENOL) tablet 1,000 mg   cefTRIAXone (ROCEPHIN) injection 1 g    Order Specific Question:   Antibiotic Indication:    Answer:   Cellulitis   lidocaine (PF) (XYLOCAINE) 1 % injection 2 mL   cephALEXin (KEFLEX) 500 MG capsule    Sig: Take 1 capsule (500 mg  total) by mouth 4 (four) times daily for 7 days.    Dispense:  28 capsule    Refill:  0    -I have reviewed the patients home medicines and have made adjustments as needed    Reevaluation: After the interventions noted above, I reevaluated the patient and found that they have improved  Co morbidities that complicate the patient evaluation  Past Medical History:  Diagnosis Date   Diabetes mellitus without complication ([place])    High cholesterol    Hypertension       Dispostion: Disposition decision including need for hospitalization was considered, and patient discharged from emergency department.    Final Clinical Impression(s) / ED Diagnoses Final diagnoses:  Cellulitis of right upper extremity     This chart was dictated using voice recognition software.  Despite best efforts to proofread,  errors can occur which can change the documentation meaning.    Cristie Hem, MD 08/01/22 2231

## 2022-08-01 NOTE — ED Triage Notes (Signed)
Pt reports her right hand is swollen. Started around 3/4pm today. Painful.

## 2022-08-01 NOTE — Discharge Instructions (Addendum)
We evaluated you for your hand pain.  Your evaluation shows signs of a skin infection on your hand.  We marked the area with a marker.  We gave you a dose of antibiotics in the emergency department.  Please take the prescribed antibiotics 4 times daily for 7 days.  Please schedule follow-up appointment with your primary doctor for recheck of your rash.  Please return if you develop any worsening symptoms, the rash spreads beyond the line, you develop fevers and chills at home, vomiting, lightheadedness or dizziness, or any other concerning symptoms.

## 2022-08-03 DIAGNOSIS — Z6827 Body mass index (BMI) 27.0-27.9, adult: Secondary | ICD-10-CM | POA: Diagnosis not present

## 2022-08-03 DIAGNOSIS — E663 Overweight: Secondary | ICD-10-CM | POA: Diagnosis not present

## 2022-08-03 DIAGNOSIS — L03113 Cellulitis of right upper limb: Secondary | ICD-10-CM | POA: Diagnosis not present

## 2022-09-19 DIAGNOSIS — U071 COVID-19: Secondary | ICD-10-CM | POA: Diagnosis not present

## 2022-11-03 ENCOUNTER — Ambulatory Visit (HOSPITAL_COMMUNITY)
Admission: RE | Admit: 2022-11-03 | Discharge: 2022-11-03 | Disposition: A | Discharge status: Home / Self Care | Payer: Medicare Other | Source: Ambulatory Visit | Attending: Family Medicine | Admitting: Family Medicine

## 2022-11-03 ENCOUNTER — Other Ambulatory Visit (HOSPITAL_COMMUNITY): Payer: Self-pay | Admitting: Family Medicine

## 2022-11-03 DIAGNOSIS — E1165 Type 2 diabetes mellitus with hyperglycemia: Secondary | ICD-10-CM | POA: Diagnosis not present

## 2022-11-03 DIAGNOSIS — R053 Chronic cough: Secondary | ICD-10-CM | POA: Diagnosis not present

## 2022-11-03 DIAGNOSIS — K219 Gastro-esophageal reflux disease without esophagitis: Secondary | ICD-10-CM | POA: Diagnosis not present

## 2022-11-03 DIAGNOSIS — E782 Mixed hyperlipidemia: Secondary | ICD-10-CM | POA: Diagnosis not present

## 2022-11-03 DIAGNOSIS — U099 Post covid-19 condition, unspecified: Secondary | ICD-10-CM | POA: Diagnosis not present

## 2022-11-03 DIAGNOSIS — I10 Essential (primary) hypertension: Secondary | ICD-10-CM | POA: Diagnosis not present

## 2022-11-03 DIAGNOSIS — Z6827 Body mass index (BMI) 27.0-27.9, adult: Secondary | ICD-10-CM | POA: Diagnosis not present

## 2022-11-03 DIAGNOSIS — N181 Chronic kidney disease, stage 1: Secondary | ICD-10-CM | POA: Diagnosis not present

## 2022-11-03 DIAGNOSIS — E1129 Type 2 diabetes mellitus with other diabetic kidney complication: Secondary | ICD-10-CM | POA: Diagnosis not present

## 2022-11-03 DIAGNOSIS — E663 Overweight: Secondary | ICD-10-CM | POA: Diagnosis not present

## 2022-11-06 ENCOUNTER — Other Ambulatory Visit (HOSPITAL_COMMUNITY): Payer: Self-pay | Admitting: Family Medicine

## 2022-11-06 DIAGNOSIS — U099 Post covid-19 condition, unspecified: Secondary | ICD-10-CM

## 2022-11-25 ENCOUNTER — Encounter: Payer: Self-pay | Admitting: Urology

## 2022-11-25 ENCOUNTER — Ambulatory Visit (INDEPENDENT_AMBULATORY_CARE_PROVIDER_SITE_OTHER): Payer: Medicare Other | Admitting: Urology

## 2022-11-25 VITALS — BP 145/76 | HR 73

## 2022-11-25 DIAGNOSIS — R351 Nocturia: Secondary | ICD-10-CM

## 2022-11-25 DIAGNOSIS — N3281 Overactive bladder: Secondary | ICD-10-CM

## 2022-11-25 MED ORDER — FESOTERODINE FUMARATE ER 8 MG PO TB24: 8.0000 mg | ORAL_TABLET | Freq: Every day | ORAL | 11 refills | Status: DC

## 2022-11-25 NOTE — Patient Instructions (Signed)

## 2022-11-25 NOTE — Progress Notes (Signed)
11/25/2022 2:10 PM   ADRIELLE FLORESCA 1946-03-02 [id number]  Referring provider: Redmond School, MD [address] [place],  [place] [address]  Followup nocturia and OAB   HPI: [name] is a 77yo here for followup for OAB and nocturia. She has nocturia 3-4 which has improved since she has decreased her soda intake. She is on toviaz 53m and notes stable daytime frequency and urge incontinence. She uses 1-3 pads per day. Most days she uses 1 pad. She is happy with her response to [place] She drinks 2 sodas a day. She drinks 20oz of water at night   PMH: Past Medical History:  Diagnosis Date   Diabetes mellitus without complication ([place]ent    High cholesterol    Hypertension     Surgical History: Past Surgical History:  Procedure Laterality Date   FOOT SURGERY      Home Medications:  Allergies as of 11/25/2022   No Known Allergies      Medication List        Accurate as of November 25, 2022  2:10 PM. If you have any questions, ask your nurse or doctor.          Accu-Chek Aviva Plus test strip Generic drug: glucose blood 1 each by Other route 4 (four) times daily.   Accu-Chek FastClix Lancets Misc U TO TEST QID   amLODipine 2.5 MG tablet Commonly known as: NORVASC Take 2.5 mg by mouth daily.   ascorbic acid 500 MG tablet Commonly known as: VITAMIN C Take 1 tablet (500 mg total) by mouth daily. X 5 days   aspirin 325 MG tablet Take 325 mg by mouth daily.   BIOTIN PO Take by mouth.   calcium-vitamin D 500-5 MG-MCG tablet Commonly known as: OSCAL WITH D Take 1 tablet by mouth.   cetirizine 10 MG tablet Commonly known as: ZyrTEC Allergy Take 1 tablet (10 mg total) by mouth daily.   CITRACAL PO Take 1 tablet by mouth daily.   co-enzyme Q-10 30 MG capsule Take 30 mg by mouth 3 (three) times daily.   ELDERBERRY PO Take by mouth.   escitalopram 10 MG tablet Commonly known as: LEXAPRO Take 10 mg by mouth daily.   esomeprazole 20 MG  capsule Commonly known as: NEXIUM Take 20 mg by mouth daily at 12 noon.   fesoterodine 8 MG Tb24 tablet Commonly known as: Toviaz Take 1 tablet (8 mg total) by mouth daily.   FISH OIL PO Take by mouth.   furosemide 10 MG/ML injection Commonly known as: LASIX   furosemide 20 MG tablet Commonly known as: LASIX Take 20 mg by mouth daily as needed.   glimepiride 2 MG tablet Commonly known as: AMARYL Take 4 mg by mouth daily.   hydrochlorothiazide 25 MG tablet Commonly known as: HYDRODIURIL Take 25 mg by mouth daily.   ibuprofen 800 MG tablet Commonly known as: ADVIL Take 1 tablet (800 mg total) by mouth every 8 (eight) hours as needed.   Janumet 50-500 MG tablet Generic drug: sitaGLIPtin-metformin Take 1 tablet by mouth 2 (two) times daily.   levothyroxine 100 MCG tablet Commonly known as: SYNTHROID Take 100 mcg by mouth daily.   loperamide 2 MG capsule Commonly known as: IMODIUM Take 1 capsule (2 mg total) by mouth 2 (two) times daily as needed for diarrhea or loose stools.   losartan 100 MG tablet Commonly known as: COZAAR Take 100 mg by mouth daily.   ondansetron 8 MG disintegrating tablet Commonly known as:  ZOFRAN-ODT Take 1 tablet (8 mg total) by mouth every 8 (eight) hours as needed for nausea or vomiting.   pantoprazole 40 MG tablet Commonly known as: PROTONIX Take 40 mg by mouth daily.   promethazine-dextromethorphan 6.25-15 MG/5ML syrup Commonly known as: PROMETHAZINE-DM Take 5 mLs by mouth at bedtime as needed for cough.   simvastatin 40 MG tablet Commonly known as: ZOCOR Take 40 mg by mouth daily.   VITAMIN B 12 PO Take by mouth.   VITAMIN D PO Take 1 tablet by mouth daily.   zinc sulfate 220 (50 Zn) MG capsule Take 1 capsule (220 mg total) by mouth daily. X 5 days        Allergies: No Known Allergies  Family History: No family history on file.  Social History:  reports that she has never smoked. She has never used smokeless  tobacco. She reports that she does not drink alcohol and does not use drugs.  ROS: All other review of systems were reviewed and are negative except what is noted above in HPI  Physical Exam: BP (!) 145/76   Pulse 73   Constitutional:  Alert and oriented, No acute distress. HEENT: [place] AT, moist mucus membranes.  Trachea midline, no masses. Cardiovascular: No clubbing, cyanosis, or edema. Respiratory: Normal respiratory effort, no increased work of breathing. GI: Abdomen is soft, nontender, nondistended, no abdominal masses GU: No CVA tenderness.  Lymph: No cervical or inguinal lymphadenopathy. Skin: No rashes, bruises or suspicious lesions. Neurologic: Grossly intact, no focal deficits, moving all 4 extremities. Psychiatric: Normal mood and affect.  Laboratory Data: Lab Results  Component Value Date   WBC 11.2 (H) 08/04/2020   HGB 13.8 08/04/2020   HCT 42.4 08/04/2020   MCV 89.8 08/04/2020   PLT 364 08/04/2020    Lab Results  Component Value Date   CREATININE 0.56 08/04/2020    No results found for: "PSA"  No results found for: "TESTOSTERONE"  Lab Results  Component Value Date   HGBA1C 8.7 (H) 07/30/2020    Urinalysis    Component Value Date/Time   APPEARANCEUR Clear 11/12/2021 1452   GLUCOSEU Negative 11/12/2021 1452   BILIRUBINUR Negative 11/12/2021 1452   PROTEINUR Negative 11/12/2021 1452   NITRITE Negative 11/12/2021 1452   LEUKOCYTESUR Negative 11/12/2021 1452    Lab Results  Component Value Date   LABMICR Comment 11/12/2021    Pertinent Imaging:  No results found for this or any previous visit.  No results found for this or any previous visit.  No results found for this or any previous visit.  No results found for this or any previous visit.  No results found for this or any previous visit.  No valid procedures specified. No results found for this or any previous visit.  No results found for this or any previous visit.   Assessment &  Plan:    1. OAB (overactive bladder) -Continue toviaz 1m daily - Urinalysis, Routine w reflex microscopic  2. Nocturia -decrease fluid intake within 2 hours of going to bed.   No follow-ups on file.  PNicolette Bang MD  [facility] [place] [facility] [place]

## 2022-11-26 LAB — MICROSCOPIC EXAMINATION: Bacteria, UA: NONE SEEN

## 2022-11-26 LAB — URINALYSIS, ROUTINE W REFLEX MICROSCOPIC
Bilirubin, UA: NEGATIVE
Nitrite, UA: NEGATIVE
RBC, UA: NEGATIVE
Specific Gravity, UA: 1.025 (ref 1.005–1.030)
Urobilinogen, Ur: 1 mg/dL (ref 0.2–1.0)
pH, UA: 5 (ref 5.0–7.5)

## 2022-12-03 ENCOUNTER — Ambulatory Visit (HOSPITAL_COMMUNITY)
Admission: RE | Admit: 2022-12-03 | Discharge: 2022-12-03 | Disposition: A | Discharge status: Home / Self Care | Payer: Medicare Other | Source: Ambulatory Visit | Attending: Family Medicine | Admitting: Family Medicine

## 2022-12-03 DIAGNOSIS — R0602 Shortness of breath: Secondary | ICD-10-CM | POA: Diagnosis not present

## 2022-12-03 DIAGNOSIS — U099 Post covid-19 condition, unspecified: Secondary | ICD-10-CM | POA: Insufficient documentation

## 2022-12-03 DIAGNOSIS — I700 Atherosclerosis of aorta: Secondary | ICD-10-CM | POA: Diagnosis not present

## 2022-12-03 DIAGNOSIS — R053 Chronic cough: Secondary | ICD-10-CM | POA: Diagnosis not present

## 2022-12-10 ENCOUNTER — Encounter: Payer: Self-pay | Admitting: Radiology

## 2023-04-09 DIAGNOSIS — Z1231 Encounter for screening mammogram for malignant neoplasm of breast: Secondary | ICD-10-CM | POA: Diagnosis not present

## 2023-04-09 DIAGNOSIS — N951 Menopausal and female climacteric states: Secondary | ICD-10-CM | POA: Diagnosis not present

## 2023-04-09 DIAGNOSIS — M8588 Other specified disorders of bone density and structure, other site: Secondary | ICD-10-CM | POA: Diagnosis not present

## 2023-04-09 DIAGNOSIS — E119 Type 2 diabetes mellitus without complications: Secondary | ICD-10-CM | POA: Diagnosis not present

## 2023-04-09 DIAGNOSIS — Z853 Personal history of malignant neoplasm of breast: Secondary | ICD-10-CM | POA: Diagnosis not present

## 2023-04-14 DIAGNOSIS — J029 Acute pharyngitis, unspecified: Secondary | ICD-10-CM | POA: Diagnosis not present

## 2023-07-29 DIAGNOSIS — Z6826 Body mass index (BMI) 26.0-26.9, adult: Secondary | ICD-10-CM | POA: Diagnosis not present

## 2023-07-29 DIAGNOSIS — F329 Major depressive disorder, single episode, unspecified: Secondary | ICD-10-CM | POA: Diagnosis not present

## 2023-07-29 DIAGNOSIS — I10 Essential (primary) hypertension: Secondary | ICD-10-CM | POA: Diagnosis not present

## 2023-07-29 DIAGNOSIS — E782 Mixed hyperlipidemia: Secondary | ICD-10-CM | POA: Diagnosis not present

## 2023-07-29 DIAGNOSIS — E1129 Type 2 diabetes mellitus with other diabetic kidney complication: Secondary | ICD-10-CM | POA: Diagnosis not present

## 2023-07-29 DIAGNOSIS — F419 Anxiety disorder, unspecified: Secondary | ICD-10-CM | POA: Diagnosis not present

## 2023-07-29 DIAGNOSIS — Z1331 Encounter for screening for depression: Secondary | ICD-10-CM | POA: Diagnosis not present

## 2023-07-29 DIAGNOSIS — Z23 Encounter for immunization: Secondary | ICD-10-CM | POA: Diagnosis not present

## 2023-07-29 DIAGNOSIS — Z0001 Encounter for general adult medical examination with abnormal findings: Secondary | ICD-10-CM | POA: Diagnosis not present

## 2023-07-29 DIAGNOSIS — K219 Gastro-esophageal reflux disease without esophagitis: Secondary | ICD-10-CM | POA: Diagnosis not present

## 2023-08-18 ENCOUNTER — Other Ambulatory Visit: Payer: Self-pay

## 2023-08-18 ENCOUNTER — Encounter (HOSPITAL_COMMUNITY): Payer: Self-pay | Admitting: *Deleted

## 2023-08-18 ENCOUNTER — Emergency Department (HOSPITAL_COMMUNITY)
Admission: EM | Admit: 2023-08-18 | Discharge: 2023-08-18 | Disposition: A | Discharge status: Home / Self Care | Payer: Medicare Other

## 2023-08-18 DIAGNOSIS — K529 Noninfective gastroenteritis and colitis, unspecified: Secondary | ICD-10-CM | POA: Insufficient documentation

## 2023-08-18 DIAGNOSIS — Z7982 Long term (current) use of aspirin: Secondary | ICD-10-CM | POA: Insufficient documentation

## 2023-08-18 DIAGNOSIS — R112 Nausea with vomiting, unspecified: Secondary | ICD-10-CM | POA: Diagnosis present

## 2023-08-18 LAB — COMPREHENSIVE METABOLIC PANEL
ALT: 13 U/L (ref 0–44)
AST: 22 U/L (ref 15–41)
Albumin: 4.2 g/dL (ref 3.5–5.0)
Alkaline Phosphatase: 40 U/L (ref 38–126)
Anion gap: 17 — ABNORMAL HIGH (ref 5–15)
BUN: 21 mg/dL (ref 8–23)
CO2: 26 mmol/L (ref 22–32)
Calcium: 9.1 mg/dL (ref 8.9–10.3)
Chloride: 88 mmol/L — ABNORMAL LOW (ref 98–111)
Creatinine, Ser: 1.31 mg/dL — ABNORMAL HIGH (ref 0.44–1.00)
GFR, Estimated: 42 mL/min — ABNORMAL LOW (ref >60)
Glucose, Bld: 194 mg/dL — ABNORMAL HIGH (ref 70–99)
Potassium: 3.2 mmol/L — ABNORMAL LOW (ref 3.5–5.1)
Sodium: 131 mmol/L — ABNORMAL LOW (ref 135–145)
Total Bilirubin: 0.4 mg/dL (ref <1.2)
Total Protein: 6.8 g/dL (ref 6.5–8.1)

## 2023-08-18 LAB — URINALYSIS, ROUTINE W REFLEX MICROSCOPIC
Bacteria, UA: NONE SEEN
Bilirubin Urine: NEGATIVE
Glucose, UA: NEGATIVE mg/dL
Hgb urine dipstick: NEGATIVE
Ketones, ur: NEGATIVE mg/dL
Nitrite: NEGATIVE
Protein, ur: NEGATIVE mg/dL
Specific Gravity, Urine: 1.015 (ref 1.005–1.030)
pH: 5 (ref 5.0–8.0)

## 2023-08-18 LAB — CBC
HCT: 36 % (ref 36.0–46.0)
Hemoglobin: 11.8 g/dL — ABNORMAL LOW (ref 12.0–15.0)
MCH: 28.6 pg (ref 26.0–34.0)
MCHC: 32.8 g/dL (ref 30.0–36.0)
MCV: 87.2 fL (ref 80.0–100.0)
Platelets: 423 10*3/uL — ABNORMAL HIGH (ref 150–400)
RBC: 4.13 MIL/uL (ref 3.87–5.11)
RDW: 13.3 % (ref 11.5–15.5)
WBC: 11.4 10*3/uL — ABNORMAL HIGH (ref 4.0–10.5)
nRBC: 0 % (ref 0.0–0.2)

## 2023-08-18 LAB — POC OCCULT BLOOD, ED: Fecal Occult Blood: NEGATIVE

## 2023-08-18 LAB — LIPASE, BLOOD: Lipase: 28 U/L (ref 11–51)

## 2023-08-18 MED ORDER — ONDANSETRON 4 MG PO TBDP: 4.0000 mg | ORAL_TABLET | Freq: Once | ORAL | Status: DC

## 2023-08-18 MED ORDER — ONDANSETRON HCL 4 MG PO TABS: 4.0000 mg | ORAL_TABLET | Freq: Four times a day (QID) | ORAL | 0 refills | Status: DC

## 2023-08-18 MED ORDER — SODIUM CHLORIDE 0.9 % IV BOLUS
500.0000 mL | Freq: Once | INTRAVENOUS | Status: AC
  Administered 2023-08-18: 500 mL via INTRAVENOUS

## 2023-08-18 MED ORDER — ONDANSETRON HCL 4 MG/2ML IJ SOLN
4.0000 mg | Freq: Once | INTRAMUSCULAR | Status: AC
  Administered 2023-08-18: 4 mg via INTRAVENOUS
  Filled 2023-08-18: qty 2

## 2023-08-18 MED ORDER — LOPERAMIDE HCL 2 MG PO CAPS: 2.0000 mg | ORAL_CAPSULE | Freq: Two times a day (BID) | ORAL | 0 refills | Status: DC | PRN

## 2023-08-18 NOTE — Discharge Instructions (Addendum)
Please follow-up with your primary doctor.  Please maintain oral hydration with frequent sips of water, low sugar Gatorade or Pedialyte.  You may use the medications we have provided for symptomatic relief.  Please return immediately if develop fevers, chills, chest pain, shortness of breath, abdominal pain, lightheadedness, passout or you develop any new or worsening symptoms that are concerning to you.

## 2023-08-18 NOTE — ED Notes (Signed)
Pt able to drink water without vomiting.

## 2023-08-18 NOTE — ED Triage Notes (Signed)
Pt c/o vomiting and diarrhea x 4 days; pt denies any abdominal pain

## 2023-08-19 NOTE — ED Provider Notes (Signed)
[facility] AT [facility] Provider Note   CSN: [id number] Arrival date & time: 08/18/23  1611     History  Chief Complaint  Patient presents with   Emesis    [name] is a 77 y.o. female.  This is a 77 year old female present emergency department with nausea vomiting diarrhea.  Symptoms x 4 days.  No abdominal pain, no fevers no chills no chest pain or shortness of breath.  No lightheadedness no dizziness.   Emesis      Home Medications Prior to Admission medications   Medication Sig Start Date End Date Taking? Authorizing Provider  loperamide (IMODIUM) 2 MG capsule Take 1 capsule (2 mg total) by mouth 2 (two) times daily as needed for diarrhea or loose stools. 08/18/23  Yes Mariell Nester, Feliz Beam J, DO  ondansetron (ZOFRAN) 4 MG tablet Take 1 tablet (4 mg total) by mouth every 6 (six) hours. 08/18/23  Yes Bunny Kleist, Harmon Dun, DO  ACCU-CHEK AVIVA PLUS test strip 1 each by Other route 4 (four) times daily.  01/25/18   [provider]  ACCU-CHEK FASTCLIX LANCETS MISC U TO TEST QID 01/21/18   [provider]  amLODipine (NORVASC) 2.5 MG tablet Take 2.5 mg by mouth daily. 08/06/20   [provider]  ascorbic acid (VITAMIN C) 500 MG tablet Take 1 tablet (500 mg total) by mouth daily. X 5 days 08/06/20   Catarina Hartshorn, MD  aspirin 325 MG tablet Take 325 mg by mouth daily.    [provider]  BIOTIN PO Take by mouth.    [provider]  Calcium Citrate (CITRACAL PO) Take 1 tablet by mouth daily.     [provider]  calcium-vitamin D (OSCAL WITH D) 500-5 MG-MCG tablet Take 1 tablet by mouth.    [provider]  cetirizine (ZYRTEC ALLERGY) 10 MG tablet Take 1 tablet (10 mg total) by mouth daily. 12/18/21   Wallis Bamberg, PA-C  Cholecalciferol (VITAMIN D PO) Take 1 tablet by mouth daily.     [provider]  co-enzyme Q-10 30 MG capsule Take 30 mg by mouth 3 (three) times daily.    [provider]  Cyanocobalamin (VITAMIN B 12 PO) Take by mouth.    [provider]  ELDERBERRY PO Take by mouth.    [provider]  escitalopram (LEXAPRO) 10 MG tablet Take 10 mg by mouth daily.  01/20/18   [provider]  esomeprazole (NEXIUM) 20 MG capsule Take 20 mg by mouth daily at 12 noon. Patient not taking: Reported on 11/25/2022    [provider]  fesoterodine (TOVIAZ) 8 MG TB24 tablet Take 1 tablet (8 mg total) by mouth daily. 11/25/22   McKenzie, Mardene Celeste, MD  furosemide (LASIX) 10 MG/ML injection  08/06/20   [provider]  furosemide (LASIX) 20 MG tablet Take 20 mg by mouth daily as needed. 08/28/20   [provider]  glimepiride (AMARYL) 2 MG tablet Take 4 mg by mouth daily.  01/21/18   [provider]  hydrochlorothiazide (HYDRODIURIL) 25 MG tablet Take 25 mg by mouth daily.  01/21/18   [provider]  ibuprofen (ADVIL) 800 MG tablet Take 1 tablet (800 mg total) by mouth every 8 (eight) hours as needed. Patient not taking: Reported on 11/25/2022 10/20/21   Vickki Hearing, MD  JANUMET 50-500 MG tablet Take 1 tablet by mouth 2 (two) times daily. 09/25/21   [provider]  levothyroxine (SYNTHROID,  LEVOTHROID) 100 MCG tablet Take 100 mcg by mouth daily.  01/21/18   [provider]  losartan (COZAAR) 100 MG tablet Take 100 mg by mouth daily.  11/17/17   [provider]  Omega-3 Fatty Acids (FISH OIL PO) Take by mouth. Patient not taking: Reported on 11/25/2022    [provider]  ondansetron (ZOFRAN-ODT) 8 MG disintegrating tablet Take 1 tablet (8 mg total) by mouth every 8 (eight) hours as needed for nausea or vomiting. Patient not taking: Reported on 11/25/2022 12/18/21   Wallis Bamberg, PA-C  pantoprazole (PROTONIX) 40 MG tablet Take 40 mg by mouth daily. 09/02/20   [provider]  promethazine-dextromethorphan (PROMETHAZINE-DM) 6.25-15 MG/5ML syrup Take 5 mLs by mouth at bedtime as  needed for cough. Patient not taking: Reported on 11/25/2022 12/18/21   Wallis Bamberg, PA-C  simvastatin (ZOCOR) 40 MG tablet Take 40 mg by mouth daily.  01/21/18   [provider]  zinc sulfate 220 (50 Zn) MG capsule Take 1 capsule (220 mg total) by mouth daily. X 5 days Patient not taking: Reported on 11/25/2022 08/06/20   Catarina Hartshorn, MD      Allergies    Patient has no known allergies.    Review of Systems   Review of Systems  Gastrointestinal:  Positive for vomiting.    Physical Exam Updated Vital Signs BP 135/69   Pulse 75   Temp 98.2 F (36.8 C) (Oral)   Resp 16   Ht 5' (1.524 m)   Wt 63.5 kg   SpO2 99%   BMI 27.34 kg/m  Physical Exam Vitals and nursing note reviewed.  Constitutional:      General: She is not in acute distress.    Appearance: She is not toxic-appearing.  HENT:     Head: Normocephalic.     Nose: Nose normal.     Mouth/Throat:     Mouth: Mucous membranes are moist.  Eyes:     Conjunctiva/sclera: Conjunctivae normal.  Cardiovascular:     Rate and Rhythm: Normal rate and regular rhythm.  Pulmonary:     Effort: Pulmonary effort is normal.     Breath sounds: Normal breath sounds.  Abdominal:     General: Abdomen is flat. There is no distension.     Tenderness: There is no abdominal tenderness. There is no guarding or rebound.  Musculoskeletal:        General: Normal range of motion.  Skin:    General: Skin is warm and dry.     Capillary Refill: Capillary refill takes less than 2 seconds.  Neurological:     Mental Status: She is alert and oriented to person, place, and time.  Psychiatric:        Mood and Affect: Mood normal.        Behavior: Behavior normal.     ED Results / Procedures / Treatments   Labs (all labs ordered are listed, but only abnormal results are displayed) Labs Reviewed  COMPREHENSIVE METABOLIC PANEL - Abnormal; Notable for the following components:      Result Value   Sodium 131 (*)    Potassium 3.2 (*)     Chloride 88 (*)    Glucose, Bld 194 (*)    Creatinine, Ser 1.31 (*)    GFR, Estimated 42 (*)    Anion gap 17 (*)    All other components within normal limits  CBC - Abnormal; Notable for the following components:   WBC 11.4 (*)    Hemoglobin  11.8 (*)    Platelets 423 (*)    All other components within normal limits  URINALYSIS, ROUTINE W REFLEX MICROSCOPIC - Abnormal; Notable for the following components:   Leukocytes,Ua MODERATE (*)    All other components within normal limits  LIPASE, BLOOD  POC OCCULT BLOOD, ED    EKG None  Radiology No results found.  Procedures Procedures    Medications Ordered in ED Medications  ondansetron (ZOFRAN-ODT) disintegrating tablet 4 mg (4 mg Oral Patient Refused/Not Given 08/18/23 1900)  sodium chloride 0.9 % bolus 500 mL (0 mLs Intravenous Stopped 08/18/23 2223)  ondansetron (ZOFRAN) injection 4 mg (4 mg Intravenous Given 08/18/23 2125)    ED Course/ Medical Decision Making/ A&P Clinical Course as of 08/19/23 0004  Wed Aug 18, 2023  2313 Patient reevaluated to improvement after Zofran.  Tolerating p.o.  Labs with some mild dehydration, received IV fluids.  Hemoccult negative.  Hemoglobin is minor anemia, however prior to compare to 3 years ago.  She has no transaminitis to suggest hepatobiliary disease.  Lipase is not elevated.  Pancreatitis unlikely.  She did say she had some dark-colored stool, however Hemoccult negative.  No evidence of UTI.  Considered CT scan, however history vitals and labs consistent with gastroenteritis type picture.  Will discharge with supportive medications.  Follow-up PCP.  Return precautions given. [TY]    Clinical Course User Index [TY] Coral Spikes, DO                                 Medical Decision Making 77 year old female present emergency department nausea vomiting diarrhea.  See ED course for MDM disposition.  Amount and/or Complexity of Data Reviewed Independent Historian:     Details: Daughter  notes patient was told to start taking iron pills by PCP.  This would suggest some chronicity to her anemia. External Data Reviewed: radiology.    Details: CT chest 12/03/22 MPRESSION: 1. No acute findings. 2. Moderate hiatal hernia. 3. 2.6 cm benign right pleural lipoma. 4. Scattered coarse peripheral interstitial opacities in the lung bases. 5. Coronary and aortic Atherosclerosis (ICD10-I70.0).  Labs: ordered.  Risk Prescription drug management. Decision regarding hospitalization.          Final Clinical Impression(s) / ED Diagnoses Final diagnoses:  Gastroenteritis    Rx / DC Orders ED Discharge Orders          Ordered    ondansetron (ZOFRAN) 4 MG tablet  Every 6 hours        08/18/23 2317    loperamide (IMODIUM) 2 MG capsule  2 times daily PRN        08/18/23 2317              Coral Spikes, DO 08/19/23 0004

## 2023-08-25 DIAGNOSIS — A084 Viral intestinal infection, unspecified: Secondary | ICD-10-CM | POA: Diagnosis not present

## 2023-08-25 DIAGNOSIS — Z6825 Body mass index (BMI) 25.0-25.9, adult: Secondary | ICD-10-CM | POA: Diagnosis not present

## 2023-09-11 DIAGNOSIS — E1129 Type 2 diabetes mellitus with other diabetic kidney complication: Secondary | ICD-10-CM | POA: Diagnosis not present

## 2023-09-11 DIAGNOSIS — E782 Mixed hyperlipidemia: Secondary | ICD-10-CM | POA: Diagnosis not present

## 2023-09-11 DIAGNOSIS — A084 Viral intestinal infection, unspecified: Secondary | ICD-10-CM | POA: Diagnosis not present

## 2023-09-11 DIAGNOSIS — I10 Essential (primary) hypertension: Secondary | ICD-10-CM | POA: Diagnosis not present

## 2023-09-24 DIAGNOSIS — I10 Essential (primary) hypertension: Secondary | ICD-10-CM | POA: Diagnosis not present

## 2023-09-24 DIAGNOSIS — Z6824 Body mass index (BMI) 24.0-24.9, adult: Secondary | ICD-10-CM | POA: Diagnosis not present

## 2023-10-12 ENCOUNTER — Other Ambulatory Visit: Payer: Self-pay

## 2023-10-12 ENCOUNTER — Inpatient Hospital Stay (HOSPITAL_COMMUNITY)
Admission: EM | Admit: 2023-10-12 | Discharge: 2023-10-15 | DRG: 682 | Disposition: A | Discharge status: Home / Self Care | Payer: Medicare Other | Attending: Family Medicine | Admitting: Family Medicine

## 2023-10-12 ENCOUNTER — Encounter (HOSPITAL_COMMUNITY): Payer: Self-pay

## 2023-10-12 DIAGNOSIS — K449 Diaphragmatic hernia without obstruction or gangrene: Secondary | ICD-10-CM | POA: Diagnosis not present

## 2023-10-12 DIAGNOSIS — K5790 Diverticulosis of intestine, part unspecified, without perforation or abscess without bleeding: Secondary | ICD-10-CM | POA: Diagnosis present

## 2023-10-12 DIAGNOSIS — R112 Nausea with vomiting, unspecified: Secondary | ICD-10-CM | POA: Diagnosis not present

## 2023-10-12 DIAGNOSIS — Z860101 Personal history of adenomatous and serrated colon polyps: Secondary | ICD-10-CM | POA: Diagnosis not present

## 2023-10-12 DIAGNOSIS — E785 Hyperlipidemia, unspecified: Secondary | ICD-10-CM | POA: Diagnosis not present

## 2023-10-12 DIAGNOSIS — Z7984 Long term (current) use of oral hypoglycemic drugs: Secondary | ICD-10-CM

## 2023-10-12 DIAGNOSIS — E43 Unspecified severe protein-calorie malnutrition: Secondary | ICD-10-CM | POA: Diagnosis present

## 2023-10-12 DIAGNOSIS — Z6826 Body mass index (BMI) 26.0-26.9, adult: Secondary | ICD-10-CM

## 2023-10-12 DIAGNOSIS — D631 Anemia in chronic kidney disease: Secondary | ICD-10-CM | POA: Diagnosis present

## 2023-10-12 DIAGNOSIS — K2970 Gastritis, unspecified, without bleeding: Secondary | ICD-10-CM

## 2023-10-12 DIAGNOSIS — G8929 Other chronic pain: Secondary | ICD-10-CM | POA: Diagnosis present

## 2023-10-12 DIAGNOSIS — R638 Other symptoms and signs concerning food and fluid intake: Secondary | ICD-10-CM | POA: Diagnosis not present

## 2023-10-12 DIAGNOSIS — F32A Depression, unspecified: Secondary | ICD-10-CM | POA: Diagnosis present

## 2023-10-12 DIAGNOSIS — K2990 Gastroduodenitis, unspecified, without bleeding: Secondary | ICD-10-CM | POA: Diagnosis present

## 2023-10-12 DIAGNOSIS — I10 Essential (primary) hypertension: Secondary | ICD-10-CM | POA: Diagnosis present

## 2023-10-12 DIAGNOSIS — N179 Acute kidney failure, unspecified: Secondary | ICD-10-CM | POA: Diagnosis not present

## 2023-10-12 DIAGNOSIS — R109 Unspecified abdominal pain: Secondary | ICD-10-CM | POA: Diagnosis not present

## 2023-10-12 DIAGNOSIS — Z79899 Other long term (current) drug therapy: Secondary | ICD-10-CM

## 2023-10-12 DIAGNOSIS — E7800 Pure hypercholesterolemia, unspecified: Secondary | ICD-10-CM | POA: Diagnosis present

## 2023-10-12 DIAGNOSIS — K9049 Malabsorption due to intolerance, not elsewhere classified: Secondary | ICD-10-CM | POA: Diagnosis not present

## 2023-10-12 DIAGNOSIS — E876 Hypokalemia: Secondary | ICD-10-CM | POA: Diagnosis not present

## 2023-10-12 DIAGNOSIS — E1129 Type 2 diabetes mellitus with other diabetic kidney complication: Secondary | ICD-10-CM | POA: Diagnosis not present

## 2023-10-12 DIAGNOSIS — K317 Polyp of stomach and duodenum: Secondary | ICD-10-CM | POA: Diagnosis present

## 2023-10-12 DIAGNOSIS — E039 Hypothyroidism, unspecified: Secondary | ICD-10-CM | POA: Diagnosis not present

## 2023-10-12 DIAGNOSIS — R634 Abnormal weight loss: Secondary | ICD-10-CM | POA: Diagnosis not present

## 2023-10-12 DIAGNOSIS — K2100 Gastro-esophageal reflux disease with esophagitis, without bleeding: Secondary | ICD-10-CM | POA: Diagnosis present

## 2023-10-12 DIAGNOSIS — N189 Chronic kidney disease, unspecified: Secondary | ICD-10-CM | POA: Diagnosis not present

## 2023-10-12 DIAGNOSIS — K2289 Other specified disease of esophagus: Secondary | ICD-10-CM | POA: Diagnosis present

## 2023-10-12 DIAGNOSIS — E1165 Type 2 diabetes mellitus with hyperglycemia: Secondary | ICD-10-CM | POA: Diagnosis present

## 2023-10-12 DIAGNOSIS — E440 Moderate protein-calorie malnutrition: Secondary | ICD-10-CM | POA: Insufficient documentation

## 2023-10-12 DIAGNOSIS — R627 Adult failure to thrive: Secondary | ICD-10-CM | POA: Diagnosis not present

## 2023-10-12 DIAGNOSIS — D649 Anemia, unspecified: Secondary | ICD-10-CM | POA: Diagnosis present

## 2023-10-12 DIAGNOSIS — R1084 Generalized abdominal pain: Secondary | ICD-10-CM | POA: Diagnosis not present

## 2023-10-12 DIAGNOSIS — N184 Chronic kidney disease, stage 4 (severe): Secondary | ICD-10-CM

## 2023-10-12 DIAGNOSIS — K3189 Other diseases of stomach and duodenum: Secondary | ICD-10-CM | POA: Diagnosis not present

## 2023-10-12 DIAGNOSIS — Z79890 Hormone replacement therapy (postmenopausal): Secondary | ICD-10-CM | POA: Diagnosis not present

## 2023-10-12 DIAGNOSIS — R1110 Vomiting, unspecified: Secondary | ICD-10-CM | POA: Diagnosis not present

## 2023-10-12 DIAGNOSIS — Z7982 Long term (current) use of aspirin: Secondary | ICD-10-CM

## 2023-10-12 DIAGNOSIS — E119 Type 2 diabetes mellitus without complications: Secondary | ICD-10-CM

## 2023-10-12 LAB — COMPREHENSIVE METABOLIC PANEL
ALT: 13 U/L (ref 0–44)
AST: 17 U/L (ref 15–41)
Albumin: 3.7 g/dL (ref 3.5–5.0)
Alkaline Phosphatase: 33 U/L — ABNORMAL LOW (ref 38–126)
Anion gap: 12 (ref 5–15)
BUN: 29 mg/dL — ABNORMAL HIGH (ref 8–23)
CO2: 25 mmol/L (ref 22–32)
Calcium: 8.8 mg/dL — ABNORMAL LOW (ref 8.9–10.3)
Chloride: 93 mmol/L — ABNORMAL LOW (ref 98–111)
Creatinine, Ser: 2.39 mg/dL — ABNORMAL HIGH (ref 0.44–1.00)
GFR, Estimated: 20 mL/min — ABNORMAL LOW (ref >60)
Glucose, Bld: 277 mg/dL — ABNORMAL HIGH (ref 70–99)
Potassium: 3.8 mmol/L (ref 3.5–5.1)
Sodium: 130 mmol/L — ABNORMAL LOW (ref 135–145)
Total Bilirubin: 0.6 mg/dL (ref 0.0–1.2)
Total Protein: 6.3 g/dL — ABNORMAL LOW (ref 6.5–8.1)

## 2023-10-12 LAB — CBC
HCT: 32.5 % — ABNORMAL LOW (ref 36.0–46.0)
Hemoglobin: 10.5 g/dL — ABNORMAL LOW (ref 12.0–15.0)
MCH: 29 pg (ref 26.0–34.0)
MCHC: 32.3 g/dL (ref 30.0–36.0)
MCV: 89.8 fL (ref 80.0–100.0)
Platelets: 346 10*3/uL (ref 150–400)
RBC: 3.62 MIL/uL — ABNORMAL LOW (ref 3.87–5.11)
RDW: 14.3 % (ref 11.5–15.5)
WBC: 9.4 10*3/uL (ref 4.0–10.5)
nRBC: 0 % (ref 0.0–0.2)

## 2023-10-12 LAB — LIPASE, BLOOD: Lipase: 35 U/L (ref 11–51)

## 2023-10-12 NOTE — ED Triage Notes (Signed)
 Pt arrives with daughter who repots that pt has been c/o abdominal pain for 3 months states that patient was here about a month ago and refused scan because of wait. Daughter reports pt has been vomiting after eating. Pt also endorses vomiting in the morning. Pt points to middle/lower abdomen when asked where pain is. Last BM today, pt reports WNL.

## 2023-10-13 ENCOUNTER — Emergency Department (HOSPITAL_COMMUNITY): Payer: Medicare Other

## 2023-10-13 DIAGNOSIS — I10 Essential (primary) hypertension: Secondary | ICD-10-CM

## 2023-10-13 DIAGNOSIS — R638 Other symptoms and signs concerning food and fluid intake: Secondary | ICD-10-CM | POA: Diagnosis not present

## 2023-10-13 DIAGNOSIS — Z7984 Long term (current) use of oral hypoglycemic drugs: Secondary | ICD-10-CM | POA: Diagnosis not present

## 2023-10-13 DIAGNOSIS — R627 Adult failure to thrive: Secondary | ICD-10-CM

## 2023-10-13 DIAGNOSIS — R1110 Vomiting, unspecified: Secondary | ICD-10-CM

## 2023-10-13 DIAGNOSIS — K2990 Gastroduodenitis, unspecified, without bleeding: Secondary | ICD-10-CM | POA: Diagnosis present

## 2023-10-13 DIAGNOSIS — K449 Diaphragmatic hernia without obstruction or gangrene: Secondary | ICD-10-CM | POA: Diagnosis present

## 2023-10-13 DIAGNOSIS — K9049 Malabsorption due to intolerance, not elsewhere classified: Secondary | ICD-10-CM | POA: Diagnosis not present

## 2023-10-13 DIAGNOSIS — E785 Hyperlipidemia, unspecified: Secondary | ICD-10-CM | POA: Diagnosis not present

## 2023-10-13 DIAGNOSIS — D631 Anemia in chronic kidney disease: Secondary | ICD-10-CM | POA: Diagnosis present

## 2023-10-13 DIAGNOSIS — Z79899 Other long term (current) drug therapy: Secondary | ICD-10-CM | POA: Diagnosis not present

## 2023-10-13 DIAGNOSIS — N179 Acute kidney failure, unspecified: Secondary | ICD-10-CM | POA: Diagnosis present

## 2023-10-13 DIAGNOSIS — Z6826 Body mass index (BMI) 26.0-26.9, adult: Secondary | ICD-10-CM | POA: Diagnosis not present

## 2023-10-13 DIAGNOSIS — K2100 Gastro-esophageal reflux disease with esophagitis, without bleeding: Secondary | ICD-10-CM | POA: Diagnosis present

## 2023-10-13 DIAGNOSIS — K2970 Gastritis, unspecified, without bleeding: Secondary | ICD-10-CM | POA: Diagnosis present

## 2023-10-13 DIAGNOSIS — E1165 Type 2 diabetes mellitus with hyperglycemia: Secondary | ICD-10-CM | POA: Diagnosis present

## 2023-10-13 DIAGNOSIS — R109 Unspecified abdominal pain: Secondary | ICD-10-CM | POA: Diagnosis not present

## 2023-10-13 DIAGNOSIS — R634 Abnormal weight loss: Secondary | ICD-10-CM | POA: Diagnosis not present

## 2023-10-13 DIAGNOSIS — K2289 Other specified disease of esophagus: Secondary | ICD-10-CM | POA: Diagnosis present

## 2023-10-13 DIAGNOSIS — D649 Anemia, unspecified: Secondary | ICD-10-CM | POA: Diagnosis present

## 2023-10-13 DIAGNOSIS — F32A Depression, unspecified: Secondary | ICD-10-CM | POA: Diagnosis present

## 2023-10-13 DIAGNOSIS — K3189 Other diseases of stomach and duodenum: Secondary | ICD-10-CM | POA: Diagnosis present

## 2023-10-13 DIAGNOSIS — K317 Polyp of stomach and duodenum: Secondary | ICD-10-CM | POA: Diagnosis present

## 2023-10-13 DIAGNOSIS — Z860101 Personal history of adenomatous and serrated colon polyps: Secondary | ICD-10-CM | POA: Diagnosis not present

## 2023-10-13 DIAGNOSIS — E7800 Pure hypercholesterolemia, unspecified: Secondary | ICD-10-CM | POA: Diagnosis present

## 2023-10-13 DIAGNOSIS — E039 Hypothyroidism, unspecified: Secondary | ICD-10-CM | POA: Diagnosis present

## 2023-10-13 DIAGNOSIS — Z79890 Hormone replacement therapy (postmenopausal): Secondary | ICD-10-CM | POA: Diagnosis not present

## 2023-10-13 DIAGNOSIS — E876 Hypokalemia: Secondary | ICD-10-CM | POA: Diagnosis not present

## 2023-10-13 DIAGNOSIS — G8929 Other chronic pain: Secondary | ICD-10-CM | POA: Diagnosis present

## 2023-10-13 DIAGNOSIS — E43 Unspecified severe protein-calorie malnutrition: Secondary | ICD-10-CM | POA: Diagnosis present

## 2023-10-13 DIAGNOSIS — R1084 Generalized abdominal pain: Secondary | ICD-10-CM | POA: Diagnosis present

## 2023-10-13 DIAGNOSIS — N189 Chronic kidney disease, unspecified: Secondary | ICD-10-CM | POA: Diagnosis not present

## 2023-10-13 LAB — IRON AND TIBC
Iron: 58 ug/dL (ref 28–170)
Saturation Ratios: 15 % (ref 10.4–31.8)
TIBC: 399 ug/dL (ref 250–450)
UIBC: 341 ug/dL

## 2023-10-13 LAB — GLUCOSE, CAPILLARY
Glucose-Capillary: 146 mg/dL — ABNORMAL HIGH (ref 70–99)
Glucose-Capillary: 120 mg/dL — ABNORMAL HIGH (ref 70–99)
Glucose-Capillary: 189 mg/dL — ABNORMAL HIGH (ref 70–99)

## 2023-10-13 LAB — VITAMIN B12: Vitamin B-12: 267 pg/mL (ref 180–914)

## 2023-10-13 LAB — URINALYSIS, ROUTINE W REFLEX MICROSCOPIC
Bilirubin Urine: NEGATIVE
Glucose, UA: NEGATIVE mg/dL
Hgb urine dipstick: NEGATIVE
Ketones, ur: NEGATIVE mg/dL
Nitrite: NEGATIVE
Protein, ur: NEGATIVE mg/dL
Specific Gravity, Urine: 1.004 — ABNORMAL LOW (ref 1.005–1.030)
pH: 7 (ref 5.0–8.0)

## 2023-10-13 LAB — FOLATE: Folate: 21.8 ng/mL (ref >5.9)

## 2023-10-13 LAB — RETICULOCYTES
Immature Retic Fract: 6.9 % (ref 2.3–15.9)
RBC.: 3.55 MIL/uL — ABNORMAL LOW (ref 3.87–5.11)
Retic Count, Absolute: 27.3 10*3/uL (ref 19.0–186.0)
Retic Ct Pct: 0.8 % (ref 0.4–3.1)

## 2023-10-13 LAB — FERRITIN: Ferritin: 17 ng/mL (ref 11–307)

## 2023-10-13 LAB — VITAMIN D 25 HYDROXY (VIT D DEFICIENCY, FRACTURES): Vit D, 25-Hydroxy: 51.44 ng/mL (ref 30–100)

## 2023-10-13 LAB — TSH: TSH: 1.554 u[IU]/mL (ref 0.350–4.500)

## 2023-10-13 MED ORDER — INSULIN ASPART 100 UNIT/ML IJ SOLN
0.0000 [IU] | Freq: Three times a day (TID) | INTRAMUSCULAR | Status: DC
Start: 2023-10-13 — End: 2023-10-15
  Administered 2023-10-13 – 2023-10-14 (×2): 1 [IU] via SUBCUTANEOUS
  Administered 2023-10-14: 3 [IU] via SUBCUTANEOUS
  Administered 2023-10-15: 2 [IU] via SUBCUTANEOUS
  Administered 2023-10-15: 1 [IU] via SUBCUTANEOUS

## 2023-10-13 MED ORDER — ACETAMINOPHEN 325 MG PO TABS: 650.0000 mg | ORAL_TABLET | Freq: Four times a day (QID) | ORAL | Status: DC | PRN

## 2023-10-13 MED ORDER — ACETAMINOPHEN 650 MG RE SUPP: 650.0000 mg | Freq: Four times a day (QID) | RECTAL | Status: DC | PRN

## 2023-10-13 MED ORDER — ONDANSETRON HCL 4 MG/2ML IJ SOLN: 4.0000 mg | Freq: Four times a day (QID) | INTRAMUSCULAR | Status: DC | PRN

## 2023-10-13 MED ORDER — SIMVASTATIN 20 MG PO TABS
40.0000 mg | ORAL_TABLET | Freq: Every day | ORAL | Status: DC
Start: 2023-10-13 — End: 2023-10-15
  Administered 2023-10-13 – 2023-10-14 (×2): 40 mg via ORAL
  Filled 2023-10-13 (×2): qty 2

## 2023-10-13 MED ORDER — PANTOPRAZOLE SODIUM 40 MG PO TBEC
40.0000 mg | DELAYED_RELEASE_TABLET | Freq: Every day | ORAL | Status: DC
  Administered 2023-10-13 – 2023-10-15 (×3): 40 mg via ORAL
  Filled 2023-10-13 (×3): qty 1

## 2023-10-13 MED ORDER — ESCITALOPRAM OXALATE 10 MG PO TABS
10.0000 mg | ORAL_TABLET | Freq: Every day | ORAL | Status: DC
  Administered 2023-10-13 – 2023-10-15 (×3): 10 mg via ORAL
  Filled 2023-10-13 (×3): qty 1

## 2023-10-13 MED ORDER — AMLODIPINE BESYLATE 5 MG PO TABS
5.0000 mg | ORAL_TABLET | Freq: Every day | ORAL | Status: DC
  Administered 2023-10-13 – 2023-10-15 (×3): 5 mg via ORAL
  Filled 2023-10-13 (×3): qty 1

## 2023-10-13 MED ORDER — HEPARIN SODIUM (PORCINE) 5000 UNIT/ML IJ SOLN
5000.0000 [IU] | Freq: Three times a day (TID) | INTRAMUSCULAR | Status: DC
Start: 2023-10-13 — End: 2023-10-14
  Administered 2023-10-13: 5000 [IU] via SUBCUTANEOUS
  Filled 2023-10-13: qty 1

## 2023-10-13 MED ORDER — HYDRALAZINE HCL 20 MG/ML IJ SOLN
10.0000 mg | INTRAMUSCULAR | Status: DC | PRN
  Administered 2023-10-13: 10 mg via INTRAVENOUS
  Filled 2023-10-13: qty 1

## 2023-10-13 MED ORDER — FESOTERODINE FUMARATE ER 4 MG PO TB24
8.0000 mg | ORAL_TABLET | Freq: Every day | ORAL | Status: DC
  Administered 2023-10-14 – 2023-10-15 (×2): 8 mg via ORAL
  Filled 2023-10-13 (×2): qty 1
  Filled 2023-10-13: qty 2
  Filled 2023-10-13 (×3): qty 1

## 2023-10-13 MED ORDER — LEVOTHYROXINE SODIUM 100 MCG PO TABS
100.0000 ug | ORAL_TABLET | Freq: Every day | ORAL | Status: DC
  Administered 2023-10-14 – 2023-10-15 (×2): 100 ug via ORAL
  Filled 2023-10-13 (×2): qty 1

## 2023-10-13 MED ORDER — BISACODYL 5 MG PO TBEC: 5.0000 mg | DELAYED_RELEASE_TABLET | Freq: Every day | ORAL | Status: DC | PRN

## 2023-10-13 MED ORDER — ASPIRIN 325 MG PO TABS
325.0000 mg | ORAL_TABLET | Freq: Every day | ORAL | Status: DC
  Administered 2023-10-13: 325 mg via ORAL
  Filled 2023-10-13: qty 1

## 2023-10-13 MED ORDER — ASPIRIN 81 MG PO CHEW
81.0000 mg | CHEWABLE_TABLET | Freq: Every day | ORAL | Status: DC
  Administered 2023-10-15: 81 mg via ORAL
  Filled 2023-10-13 (×2): qty 1

## 2023-10-13 MED ORDER — SODIUM CHLORIDE 0.9 % IV SOLN: INTRAVENOUS | Status: DC

## 2023-10-13 MED ORDER — SODIUM CHLORIDE 0.9 % IV BOLUS
1000.0000 mL | Freq: Once | INTRAVENOUS | Status: AC
Start: 2023-10-13 — End: 2023-10-14
  Administered 2023-10-13: 1000 mL via INTRAVENOUS

## 2023-10-13 MED ORDER — HYDROMORPHONE HCL 1 MG/ML IJ SOLN
0.2500 mg | INTRAMUSCULAR | Status: DC | PRN
Start: 2023-10-13 — End: 2023-10-15

## 2023-10-13 MED ORDER — OXYCODONE HCL 5 MG PO TABS: 5.0000 mg | ORAL_TABLET | ORAL | Status: DC | PRN

## 2023-10-13 MED ORDER — ASPIRIN 81 MG PO CHEW: 81.0000 mg | CHEWABLE_TABLET | Freq: Every day | ORAL | Status: DC

## 2023-10-13 MED ORDER — ONDANSETRON HCL 4 MG PO TABS: 4.0000 mg | ORAL_TABLET | Freq: Four times a day (QID) | ORAL | Status: DC | PRN

## 2023-10-13 MED ORDER — SODIUM CHLORIDE 0.9 % IV SOLN: INTRAVENOUS | Status: AC

## 2023-10-13 NOTE — ED Notes (Signed)
 Pt and family aware urine sample is needed Pt can transfer minimal assist to w/c

## 2023-10-13 NOTE — Hospital Course (Signed)
 78 year old female with type 2 diabetes mellitus, hypertension, hypothyroidism, hyperlipidemia, diverticulosis, GERD, history of adenomatous colon polyps, depression was brought into the emergency department with daughter complaining of 3 months of intermittent abdominal pain and reportedly vomiting after she eats meals.  She has lost a significant amount of weight.  She reports that the pain in the abdomen is middle to lower abdomen and generalized at times.  She reports that symptoms initially started off several months ago with diarrhea which has resolved.  She reports losing about 15 pounds unintentional weight loss.  She reports vomiting after eating.  She denies fever and chills.  In the ED she was sent for CT scan but it did not show any acute findings.  She did have some abnormal labs including sodium of 130 and creatinine of 2.39 consistent with an AKI.  She was noted to have a normocytic anemia.  Admission was requested for further workup of her idiopathic abdominal pain and treatment of AKI.

## 2023-10-13 NOTE — ED Notes (Signed)
 Family at bedside.

## 2023-10-13 NOTE — Plan of Care (Signed)

## 2023-10-13 NOTE — Progress Notes (Signed)
 Mobility Specialist Progress Note:    10/13/23 0925  Mobility  Activity Ambulated with assistance in room  Level of Assistance Standby assist, set-up cues, supervision of patient - no hands on  Assistive Device None  Distance Ambulated (ft) 45 ft  Range of Motion/Exercises Active;All extremities  Activity Response Tolerated well  Mobility Referral Yes  Mobility visit 1 Mobility  Mobility Specialist Start Time (ACUTE ONLY) 0910  Mobility Specialist Stop Time (ACUTE ONLY) 0925  Mobility Specialist Time Calculation (min) (ACUTE ONLY) 15 min   Pt received in chair, agreeable to mobility. Required supervision to stand and ambulate with no AD. Tolerated well, asx throughout. Returned to chair, all needs met.  Sherrilee Ditty Mobility Specialist Please contact via Special Educational Needs Teacher or  Rehab office at [phone]

## 2023-10-13 NOTE — Consult Note (Signed)
 Toribio Fortune, M.D. Gastroenterology & Hepatology                                           Patient Name: [name] Account #: @FLAACCTNO @   MRN: [id number] Admission Date: 10/12/2023 Date of Evaluation:  10/13/2023 Time of Evaluation: 11:44 AM   Referring Physician: Afton Louder, MD  Chief Complaint: Abdominal pain and weight loss, decreased oral intake  HPI:  This is a 78 y.o. female with history of diabetes, hyperlipidemia and hypertension, who was brought to the hospital by her daughters after presenting abdominal pain, vomiting and poor oral intake with weight loss.  The patient and daughters report that the patient has presented a 81-month interval of intermittent diffuse abdominal cramping which is not related to any specific food esophagogastroduodenospy, as well as poor oral intake and nausea with vomiting episodes, even if he has not eaten.  States that the vomiting episodes are happening on a daily basis.  Has lost close to 15 pounds since her symptoms started.  States that she also had some episodes of diarrhea when her symptoms started initially, but her stools have normalized recently.  No urinary symptoms.  Daughter states that she recently had a blood pressure making out of her who her least but this was added after she started having symptoms.  Only blood pressure listed is amlodipine .  Patient has been seeing Dr. Rosalie (GI) in [place], last EGD and colonoscopy in 2019. EGD was found to have inflammation at the GE junction. 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 The only other finding was presbyesophagus.  Colonoscopy was reported to have an adenoma and was recommended to have a repeat colonoscopy in 2024.  In the ED, she was HD stable and afebrile. Labs were remarkable for creatinine 2.39, BUN 29, sodium 130, chloride 93, rest of electrolytes within normal limits, glucose 277, normal LFTs, CBC with hemoglobin 10.5, WBC 9.4 and platelets 346.  CT of the  abdomen and pelvis without IV contrast showed extensive atherosclerosis but no other abnormalities.  Urinalysis showed large leukocytes but no other abnormalities   Patient denies any smoking or drinking alcohol.  Had a mild had a malignancy in the past.  Past Medical History: SEE CHRONIC ISSSUES: Past Medical History:  Diagnosis Date   Diabetes mellitus without complication (HCC)    High cholesterol    Hypertension    Past Surgical History:  Past Surgical History:  Procedure Laterality Date   FOOT SURGERY     Family History: History reviewed. No pertinent family history. Social History:  Social History   Tobacco Use   Smoking status: Never   Smokeless tobacco: Never  Vaping Use   Vaping status: Never Used  Substance Use Topics   Alcohol use: Never   Drug use: Never    Home Medications:  Prior to Admission medications   Medication Sig Start Date End Date Taking? Authorizing Provider  ACCU-CHEK AVIVA PLUS test strip 1 each by Other route 4 (four) times daily.  01/25/18   [provider]  ACCU-CHEK FASTCLIX LANCETS MISC U TO TEST QID 01/21/18   [provider]  amLODipine  (NORVASC ) 2.5 MG tablet Take 2.5 mg by mouth daily. 08/06/20   [provider]  ascorbic acid  (VITAMIN C) 500 MG tablet Take 1 tablet (500 mg total) by mouth daily. X 5 days 08/06/20   Tat,  Alm, MD  aspirin  325 MG tablet Take 325 mg by mouth daily.    [provider]  BIOTIN PO Take by mouth.    [provider]  Calcium Citrate (CITRACAL PO) Take 1 tablet by mouth daily.     [provider]  calcium-vitamin D  (OSCAL WITH D) 500-5 MG-MCG tablet Take 1 tablet by mouth.    [provider]  cetirizine  (ZYRTEC  ALLERGY) 10 MG tablet Take 1 tablet (10 mg total) by mouth daily. 12/18/21   Christopher Savannah, PA-C  Cholecalciferol (VITAMIN D  PO) Take 1 tablet by mouth daily.     [provider]  co-enzyme Q-10 30 MG capsule Take 30 mg by mouth 3 (three)  times daily.    [provider]  Cyanocobalamin  (VITAMIN B 12 PO) Take by mouth.    [provider]  ELDERBERRY PO Take by mouth.    [provider]  escitalopram  (LEXAPRO ) 10 MG tablet Take 10 mg by mouth daily.  01/20/18   [provider]  esomeprazole (NEXIUM) 20 MG capsule Take 20 mg by mouth daily at 12 noon. Patient not taking: Reported on 11/25/2022    [provider]  fesoterodine  (TOVIAZ ) 8 MG TB24 tablet Take 1 tablet (8 mg total) by mouth daily. 11/25/22   McKenzie, Belvie CROME, MD  furosemide (LASIX) 10 MG/ML injection  08/06/20   [provider]  furosemide (LASIX) 20 MG tablet Take 20 mg by mouth daily as needed. 08/28/20   [provider]  glimepiride (AMARYL) 2 MG tablet Take 4 mg by mouth daily.  01/21/18   [provider]  hydrochlorothiazide  (HYDRODIURIL ) 25 MG tablet Take 25 mg by mouth daily.  01/21/18   [provider]  ibuprofen  (ADVIL ) 800 MG tablet Take 1 tablet (800 mg total) by mouth every 8 (eight) hours as needed. Patient not taking: Reported on 11/25/2022 10/20/21   Margrette Taft BRAVO, MD  JANUMET  50-500 MG tablet Take 1 tablet by mouth 2 (two) times daily. 09/25/21   [provider]  levothyroxine  (SYNTHROID , LEVOTHROID) 100 MCG tablet Take 100 mcg by mouth daily.  01/21/18   [provider]  loperamide  (IMODIUM ) 2 MG capsule Take 1 capsule (2 mg total) by mouth 2 (two) times daily as needed for diarrhea or loose stools. 08/18/23   Neysa Caron PARAS, DO  losartan  (COZAAR ) 100 MG tablet Take 100 mg by mouth daily.  11/17/17   [provider]  Omega-3 Fatty Acids (FISH OIL PO) Take by mouth. Patient not taking: Reported on 11/25/2022    [provider]  ondansetron  (ZOFRAN ) 4 MG tablet Take 1 tablet (4 mg total) by mouth every 6 (six) hours. 08/18/23   Neysa Caron PARAS, DO  ondansetron  (ZOFRAN -ODT) 8 MG disintegrating tablet Take 1 tablet (8 mg total) by mouth every 8  (eight) hours as needed for nausea or vomiting. Patient not taking: Reported on 11/25/2022 12/18/21   Christopher Savannah, PA-C  pantoprazole  (PROTONIX ) 40 MG tablet Take 40 mg by mouth daily. 09/02/20   [provider]  promethazine -dextromethorphan  (PROMETHAZINE -DM) 6.25-15 MG/5ML syrup Take 5 mLs by mouth at bedtime as needed for cough. Patient not taking: Reported on 11/25/2022 12/18/21   Christopher Savannah, PA-C  simvastatin  (ZOCOR ) 40 MG tablet Take 40 mg by mouth daily.  01/21/18   [provider]  zinc  sulfate 220 (50 Zn) MG capsule Take 1 capsule (220 mg total) by mouth daily. X 5 days Patient not taking: Reported on 11/25/2022 08/06/20  Evonnie Lenis, MD    Inpatient Medications:  Current Facility-Administered Medications:    0.9 %  sodium chloride  infusion, , Intravenous, Continuous, Johnson, Clanford L, MD   acetaminophen  (TYLENOL ) tablet 650 mg, 650 mg, Oral, Q6H PRN **OR** acetaminophen  (TYLENOL ) suppository 650 mg, 650 mg, Rectal, Q6H PRN, Johnson, Clanford L, MD   bisacodyl  (DULCOLAX) EC tablet 5 mg, 5 mg, Oral, Daily PRN, Johnson, Clanford L, MD   heparin  injection 5,000 Units, 5,000 Units, Subcutaneous, Q8H, Johnson, Clanford L, MD   hydrALAZINE  (APRESOLINE ) injection 10 mg, 10 mg, Intravenous, Q4H PRN, Vicci, Clanford L, MD, 10 mg at 10/13/23 9048   HYDROmorphone  (DILAUDID ) injection 0.25 mg, 0.25 mg, Intravenous, Q2H PRN, Johnson, Clanford L, MD   insulin  aspart (novoLOG ) injection 0-9 Units, 0-9 Units, Subcutaneous, TID WC, Johnson, Clanford L, MD   ondansetron  (ZOFRAN ) tablet 4 mg, 4 mg, Oral, Q6H PRN **OR** ondansetron  (ZOFRAN ) injection 4 mg, 4 mg, Intravenous, Q6H PRN, Johnson, Clanford L, MD   oxyCODONE  (Oxy IR/ROXICODONE ) immediate release tablet 5 mg, 5 mg, Oral, Q4H PRN, Johnson, Clanford L, MD   pantoprazole  (PROTONIX ) EC tablet 40 mg, 40 mg, Oral, Q0600, Johnson, Clanford L, MD, 40 mg at 10/13/23 9048 Allergies: Patient has no known allergies.  Complete Review of  Systems: GENERAL: negative for malaise, night sweats HEENT: No changes in hearing or vision, no nose bleeds or other nasal problems. NECK: Negative for lumps, goiter, pain and significant neck swelling RESPIRATORY: Negative for cough, wheezing CARDIOVASCULAR: Negative for chest pain, leg swelling, palpitations, orthopnea GI: SEE HPI MUSCULOSKELETAL: Negative for joint pain or swelling, back pain, and muscle pain. SKIN: Negative for lesions, rash PSYCH: Negative for sleep disturbance, mood disorder and recent psychosocial stressors. HEMATOLOGY Negative for prolonged bleeding, bruising easily, and swollen nodes. ENDOCRINE: Negative for cold or heat intolerance, polyuria, polydipsia and goiter. NEURO: negative for tremor, gait imbalance, syncope and seizures. The remainder of the review of systems is noncontributory.  Physical Exam: BP (!) 177/60 (BP Location: Left Arm)   Pulse 65   Temp 98 F (36.7 C) (Oral)   Resp 18   Ht 5' (1.524 m)   Wt 60.8 kg   SpO2 100%   BMI 26.17 kg/m  GENERAL: The patient is AO x3, in no acute distress. HEENT: Head is normocephalic and atraumatic. EOMI are intact. Mouth is well hydrated and without lesions. NECK: Supple. No masses LUNGS: Clear to auscultation. No presence of rhonchi/wheezing/rales. Adequate chest expansion HEART: RRR, normal s1 and s2. ABDOMEN: Soft, nontender, no guarding, no peritoneal signs, and nondistended. BS +. No masses. EXTREMITIES: Without any cyanosis, clubbing, rash, lesions or edema. NEUROLOGIC: AOx3, no focal motor deficit. SKIN: no jaundice, no rashes  Laboratory Data CBC:     Component Value Date/Time   WBC 9.4 10/12/2023 2010   RBC 3.62 (L) 10/12/2023 2010   HGB 10.5 (L) 10/12/2023 2010   HCT 32.5 (L) 10/12/2023 2010   PLT 346 10/12/2023 2010   MCV 89.8 10/12/2023 2010   MCH 29.0 10/12/2023 2010   MCHC 32.3 10/12/2023 2010   RDW 14.3 10/12/2023 2010   LYMPHSABS 1.3 08/04/2020 0639   MONOABS 0.6 08/04/2020  0639   EOSABS 0.0 08/04/2020 0639   BASOSABS 0.0 08/04/2020 0639   COAG:  Lab Results  Component Value Date   INR 1.1 07/30/2020    BMP:     Latest Ref Rng & Units 10/12/2023    8:10 PM 08/18/2023    6:22 PM 08/04/2020    6:39 AM  BMP  Glucose 70 - 99 mg/dL 722  805  72   BUN 8 - 23 mg/dL 29  21  12    Creatinine 0.44 - 1.00 mg/dL 7.60  8.68  9.43   Sodium 135 - 145 mmol/L 130  131  132   Potassium 3.5 - 5.1 mmol/L 3.8  3.2  4.3   Chloride 98 - 111 mmol/L 93  88  98   CO2 22 - 32 mmol/L 25  26  26    Calcium 8.9 - 10.3 mg/dL 8.8  9.1  8.2     HEPATIC:     Latest Ref Rng & Units 10/12/2023    8:10 PM 08/18/2023    6:22 PM 08/04/2020    6:39 AM  Hepatic Function  Total Protein 6.5 - 8.1 g/dL 6.3  6.8  5.5   Albumin 3.5 - 5.0 g/dL 3.7  4.2  2.9   AST 15 - 41 U/L 17  22  40   ALT 0 - 44 U/L 13  13  38   Alk Phosphatase 38 - 126 U/L 33  40  36   Total Bilirubin 0.0 - 1.2 mg/dL 0.6  0.4  1.0     CARDIAC: No results found for: CKTOTAL, CKMB, CKMBINDEX, TROPONINI   Imaging: I personally reviewed and interpreted the available imaging.  Assessment & Plan: SOUMYA COLSON is a 78 y.o. female with history of diabetes, hyperlipidemia and hypertension, who was brought to the hospital by her daughters after presenting abdominal pain, vomiting and poor oral intake with weight loss.  Patient has presented persistent symptoms for the last 3 months.  She actually developed some AKI due to poor oral intake based on labs.  It is unclear why she has presented the symptoms.  Cross-sectional abdominal imaging was unremarkable for any etiology.  We will evaluate this further with an EGD tomorrow.  She actually is due for surveillance colonoscopy, which could be considered to be performed as outpatient if symptoms labs stabilize during the current hospitalization.  She will need to have further IV hydration to improve her kidney function.  Other considerations include chronic mesenteric ischemia  given extensive atherosclerosis, which may need further evaluation with CT angio abdomen and pelvis once renal function improves.  - IV fluid hydration -Proceed with EGD tomorrow -Zofran  as needed for nausea control -May consider CT angio abdomen and pelvis with IV contrast if symptoms persist and negative esophagogastroduodenospy. -Possible outpatient colonoscopy with Dr. Rosalie Toribio Fortune, MD Gastroenterology and Hepatology [facility] Gastroenterology

## 2023-10-13 NOTE — Progress Notes (Signed)
 Transition of Care Department [facility]) has reviewed patient and no other TOC needs have been identified at this time. We will continue to monitor patient advancement through interdisciplinary progression rounds. If new patient transition needs arise, please place a TOC consult.   10/13/23 1027  TOC Brief Assessment  Insurance and Status Reviewed  Patient has primary care physician Yes  Home environment has been reviewed Lives with children.  Prior level of function: Fairly independent.  Prior/Current Home Services No current home services  Social Drivers of Health Review SDOH reviewed no interventions necessary  Readmission risk has been reviewed Yes  Transition of care needs no transition of care needs at this time

## 2023-10-13 NOTE — ED Provider Notes (Signed)
 [facility]-EMERGENCY DEPT [facility] Emergency Department Provider Note MRN:  [id number]  Arrival date & time: 10/13/23     Chief Complaint   Abdominal Pain   History of Present Illness   [name] is a 78 y.o. year-old female with a history of hypertension, diabetes presenting to the ED with chief complaint of abdominal pain.  Persistent abdominal pain with nausea and vomiting for the past 3 months.  15 pound unintentional weight loss due to poor p.o. intake.  Symptoms happen either before or after meals.  Denies fever, no chest pain or shortness of breath, no headache.  Review of Systems  A thorough review of systems was obtained and all systems are negative except as noted in the HPI and PMH.   Patient's Health History    Past Medical History:  Diagnosis Date   Diabetes mellitus without complication (HCC)    High cholesterol    Hypertension     Past Surgical History:  Procedure Laterality Date   FOOT SURGERY      History reviewed. No pertinent family history.  Social History   Socioeconomic History   Marital status: Single    Spouse name: Not on file   Number of children: Not on file   Years of education: Not on file   Highest education level: High school graduate  Occupational History   Occupation: retired   Tobacco Use   Smoking status: Never   Smokeless tobacco: Never  Vaping Use   Vaping status: Never Used  Substance and Sexual Activity   Alcohol use: Never   Drug use: Never   Sexual activity: Not Currently  Other Topics Concern   Not on file  Social History Narrative   Not on file   Social Drivers of Health   Financial Resource Strain: Not on file  Food Insecurity: No Food Insecurity (09/26/2020)   Hunger Vital Sign    Worried About Running Out of Food in the Last Year: Never true    Ran Out of Food in the Last Year: Never true  Transportation Needs: No Transportation Needs (09/26/2020)   PRAPARE - Administrator, Civil Service  (Medical): No    Lack of Transportation (Non-Medical): No  Physical Activity: Not on file  Stress: Not on file  Social Connections: Moderately Integrated (09/26/2020)   Social Connection and Isolation Panel [NHANES]    Frequency of Communication with Friends and Family: More than three times a week    Frequency of Social Gatherings with Friends and Family: More than three times a week    Attends Religious Services: More than 4 times per year    Active Member of [employer] or Organizations: Yes    Attends Banker Meetings: More than 4 times per year    Marital Status: Never married  Intimate Partner Violence: Not At Risk (09/26/2020)   Humiliation, Afraid, Rape, and Kick questionnaire    Fear of Current or Ex-Partner: No    Emotionally Abused: No    Physically Abused: No    Sexually Abused: No     Physical Exam   Vitals:   10/13/23 0650 10/13/23 0654  BP: (!) 191/71   Pulse: 61   Resp: 17   Temp:  97.9 F (36.6 C)  SpO2: 98%     CONSTITUTIONAL: Well-appearing, NAD NEURO/PSYCH:  Alert and oriented x 3, no focal deficits EYES:  eyes equal and reactive ENT/NECK:  no LAD, no JVD CARDIO: Regular rate, well-perfused, normal S1 and S2  PULM:  CTAB no wheezing or rhonchi GI/GU:  non-distended, non-tender MSK/SPINE:  No gross deformities, no edema SKIN:  no rash, atraumatic   *Additional and/or pertinent findings included in MDM below  Diagnostic and Interventional Summary    EKG Interpretation Date/Time:    Ventricular Rate:    PR Interval:    QRS Duration:    QT Interval:    QTC Calculation:   R Axis:      Text Interpretation:         Labs Reviewed  COMPREHENSIVE METABOLIC PANEL - Abnormal; Notable for the following components:      Result Value   Sodium 130 (*)    Chloride 93 (*)    Glucose, Bld 277 (*)    BUN 29 (*)    Creatinine, Ser 2.39 (*)    Calcium 8.8 (*)    Total Protein 6.3 (*)    Alkaline Phosphatase 33 (*)    GFR, Estimated 20 (*)     All other components within normal limits  CBC - Abnormal; Notable for the following components:   RBC 3.62 (*)    Hemoglobin 10.5 (*)    HCT 32.5 (*)    All other components within normal limits  LIPASE, BLOOD  URINALYSIS, ROUTINE W REFLEX MICROSCOPIC    CT ABDOMEN PELVIS WO CONTRAST    (Results Pending)    Medications  sodium chloride  0.9 % bolus 1,000 mL (1,000 mLs Intravenous New Bag/Given 10/13/23 0640)     Procedures  /  Critical Care Procedures  ED Course and Medical Decision Making  Initial Impression and Ddx Differential diagnosis includes neoplasm, peptic ulcer disease, gastric ulcer, mesenteric ischemia, patient with prominent acute kidney injury with triage labs, obtaining CT Noncon.  Past medical/surgical history that increases complexity of ED encounter: Hypertension diabetes  Interpretation of Diagnostics I personally reviewed the laboratory assessment and my interpretation is as follows: Hyponatremia, acute kidney injury    Patient Reassessment and Ultimate Disposition/Management     Awaiting CT abdomen, anticipating admission.  Signed out to oncoming provider at shift change.  Patient management required discussion with the following services or consulting groups:  None  Complexity of Problems Addressed Acute illness or injury that poses threat of life of bodily function  Additional Data Reviewed and Analyzed Further history obtained from: Further history from spouse/family member  Additional Factors Impacting ED Encounter Risk Consideration of hospitalization  Ozell HERO. Theadore, MD [facility] Emergency Medicine [facility] [email or web address]  Final Clinical Impressions(s) / ED Diagnoses     ICD-10-CM   1. AKI (acute kidney injury) (HCC)  N17.9     2. Generalized abdominal pain  R10.84     3. Nausea and vomiting, unspecified vomiting type  R11.2       ED Discharge Orders     None        Discharge Instructions Discussed  with and Provided to Patient:   Discharge Instructions   None      Theadore Ozell HERO, MD 10/13/23 [phone]

## 2023-10-13 NOTE — H&P (Addendum)
 History and Physical  [facility]  [name] FMW:[id number] DOB: 1946/05/18 DOA: 10/12/2023  PCP: Marvine Rush, MD  Patient coming from: Home  Level of care: Med-Surg  I have personally briefly reviewed patient's old medical records in [facility]  Chief Complaint: abdominal pain (chronic)  HPI: [name] is a 78 year old female with type 2 diabetes mellitus, hypertension, hypothyroidism, hyperlipidemia, diverticulosis, GERD, history of adenomatous colon polyps, depression was brought into the emergency department with daughter complaining of 3 months of intermittent abdominal pain and reportedly vomiting after she eats meals.  She has lost a significant amount of weight.  She reports that the pain in the abdomen is middle to lower abdomen and generalized at times.  She reports that symptoms initially started off several months ago with diarrhea which has resolved.  She reports losing about 15 pounds unintentional weight loss.  She reports vomiting after eating.  She denies fever and chills.  In the ED she was sent for CT scan but it did not show any acute findings.  She did have some abnormal labs including sodium of 130 and creatinine of 2.39 consistent with an AKI.  She was noted to have a normocytic anemia.  Admission was requested for further workup of her idiopathic abdominal pain and treatment of AKI.   Past Medical History:  Diagnosis Date   Diabetes mellitus without complication (HCC)    High cholesterol    Hypertension     Past Surgical History:  Procedure Laterality Date   FOOT SURGERY       reports that she has never smoked. She has never used smokeless tobacco. She reports that she does not drink alcohol and does not use drugs.  No Known Allergies  History reviewed. No pertinent family history.  Prior to Admission medications   Medication Sig Start Date End Date Taking? Authorizing Provider  ACCU-CHEK AVIVA PLUS test strip 1 each by Other route 4 (four)  times daily.  01/25/18   [provider]  ACCU-CHEK FASTCLIX LANCETS MISC U TO TEST QID 01/21/18   [provider]  amLODipine  (NORVASC ) 5 MG tablet Take 5 mg by mouth daily. 09/25/23  Yes [provider]  aspirin  325 MG tablet Take 325 mg by mouth daily.   Yes [provider]  escitalopram  (LEXAPRO ) 10 MG tablet Take 10 mg by mouth daily.  01/20/18  Yes [provider]  fesoterodine  (TOVIAZ ) 8 MG TB24 tablet Take 1 tablet (8 mg total) by mouth daily. 11/25/22  Yes McKenzie, Belvie CROME, MD  hydrochlorothiazide  (HYDRODIURIL ) 25 MG tablet Take 25 mg by mouth daily.  01/21/18  Yes [provider]  JANUMET  50-1000 MG tablet Take 1 tablet by mouth 2 (two) times daily. 10/05/23  Yes [provider]  levothyroxine  (SYNTHROID , LEVOTHROID) 100 MCG tablet Take 100 mcg by mouth daily.  01/21/18  Yes [provider]  olmesartan  (BENICAR ) 40 MG tablet Take 40 mg by mouth daily. 09/29/23  Yes [provider]  ondansetron  (ZOFRAN ) 4 MG tablet Take 1 tablet (4 mg total) by mouth every 6 (six) hours. 08/18/23  Yes Young, Travis J, DO  PREVIDENT 5000 DRY MOUTH 1.1 % GEL dental gel Place 1 Application onto teeth at bedtime. 09/30/23  Yes [provider]  simvastatin  (ZOCOR ) 40 MG tablet Take 40 mg by mouth daily.  01/21/18  Yes [provider]    Physical Exam: Vitals:   10/13/23 0700 10/13/23 0800 10/13/23 0851 10/13/23 0948  BP: ROLLEN)  181/72 (!) 190/74 (!) 190/72 (!) 177/60  Pulse: 65 70 69 65  Resp:   18   Temp:   98 F (36.7 C)   TempSrc:   Oral   SpO2: 98% 96% 100%   Weight:      Height:        Constitutional: appears emaciated, NAD, calm, comfortable Eyes: PERRL, lids and conjunctivae normal ENMT: Mucous membranes are dry. Posterior pharynx clear of any exudate or lesions. Normal dentition.  Neck: normal, supple, no masses, no thyromegaly Respiratory: clear to auscultation bilaterally, no wheezing, no  crackles. Normal respiratory effort. No accessory muscle use.  Cardiovascular: normal s1, s2 sounds, no murmurs / rubs / gallops. No extremity edema. 2+ pedal pulses. No carotid bruits.  Abdomen: no tenderness, no masses palpated. No hepatosplenomegaly. Bowel sounds positive.  Musculoskeletal: no clubbing / cyanosis. No joint deformity upper and lower extremities. Good ROM, no contractures. Normal muscle tone.  Skin: no rashes, lesions, ulcers. No induration Neurologic: CN 2-12 grossly intact. Sensation intact, DTR normal. Strength 5/5 in all 4.  Psychiatric: Normal judgment and insight. Alert and oriented x 3. Normal mood.   Labs on Admission: I have personally reviewed following labs and imaging studies  CBC: Recent Labs  Lab 10/12/23 2010  WBC 9.4  HGB 10.5*  HCT 32.5*  MCV 89.8  PLT 346   Basic Metabolic Panel: Recent Labs  Lab 10/12/23 2010  NA 130*  K 3.8  CL 93*  CO2 25  GLUCOSE 277*  BUN 29*  CREATININE 2.39*  CALCIUM 8.8*   GFR: Estimated Creatinine Clearance: 15.8 mL/min (A) (by C-G formula based on SCr of 2.39 mg/dL (H)). Liver Function Tests: Recent Labs  Lab 10/12/23 2010  AST 17  ALT 13  ALKPHOS 33*  BILITOT 0.6  PROT 6.3*  ALBUMIN 3.7   Recent Labs  Lab 10/12/23 2010  LIPASE 35   No results for input(s): AMMONIA in the last 168 hours. Coagulation Profile: No results for input(s): INR, PROTIME in the last 168 hours. Cardiac Enzymes: No results for input(s): CKTOTAL, CKMB, CKMBINDEX, TROPONINI in the last 168 hours. BNP (last 3 results) No results for input(s): PROBNP in the last 8760 hours. HbA1C: No results for input(s): HGBA1C in the last 72 hours. CBG: Recent Labs  Lab 10/13/23 1126  GLUCAP 146*   Lipid Profile: No results for input(s): CHOL, HDL, LDLCALC, TRIG, CHOLHDL, LDLDIRECT in the last 72 hours. Thyroid  Function Tests: Recent Labs    10/12/23 2010  TSH 1.554   Anemia Panel: No results for  input(s): VITAMINB12, FOLATE, FERRITIN, TIBC, IRON, RETICCTPCT in the last 72 hours. Urine analysis:    Component Value Date/Time   COLORURINE COLORLESS (A) 10/13/2023 1140   APPEARANCEUR CLEAR 10/13/2023 1140   APPEARANCEUR Hazy (A) 11/25/2022 1505   LABSPEC 1.004 (L) 10/13/2023 1140   PHURINE 7.0 10/13/2023 1140   GLUCOSEU NEGATIVE 10/13/2023 1140   HGBUR NEGATIVE 10/13/2023 1140   BILIRUBINUR NEGATIVE 10/13/2023 1140   BILIRUBINUR Negative 11/25/2022 1505   KETONESUR NEGATIVE 10/13/2023 1140   PROTEINUR NEGATIVE 10/13/2023 1140   NITRITE NEGATIVE 10/13/2023 1140   LEUKOCYTESUR LARGE (A) 10/13/2023 1140    Radiological Exams on Admission: CT ABDOMEN PELVIS WO CONTRAST Result Date: 10/13/2023 CLINICAL DATA:  Acute, nonlocalized abdominal pain. Abdominal pain for 3 months. EXAM: CT ABDOMEN AND PELVIS WITHOUT CONTRAST TECHNIQUE: Multidetector CT imaging of the abdomen and pelvis was performed following the standard protocol without IV contrast. RADIATION DOSE REDUCTION: This exam was performed  according to the departmental dose-optimization program which includes automated exposure control, adjustment of the mA and/or kV according to patient size and/or use of iterative reconstruction technique. COMPARISON:  None Available. FINDINGS: Lower chest: Breast implant on the right with signs of intracapsular rupture on a chronic basis. Extensive atheromatous calcification of the aorta and coronaries. Small sliding hiatal hernia. No acute finding Hepatobiliary: No focal liver abnormality.No evidence of biliary obstruction or stone. Pancreas: Unremarkable. Spleen: Unremarkable. Adrenals/Urinary Tract: Thickening of the right adrenal gland to 15 mm with low densitometry consistent with adenoma. No hydronephrosis or stone. Unremarkable bladder. Stomach/Bowel: No obstruction or abnormal stool retention. No appendicitis. Vascular/Lymphatic: No acute vascular abnormality. Extensive atheromatous  calcification of the aorta and branch vessels. No mass or adenopathy. Reproductive:Hysterectomy. Other: No ascites or pneumoperitoneum. Musculoskeletal: No acute abnormalities. Generalized spinal degeneration with lumbar dextroscoliosis. IMPRESSION: 1. No acute finding or specific cause for symptoms. 2. Extensive atherosclerosis. Electronically Signed   By: Dorn Roulette M.D.   On: 10/13/2023 07:26    EKG: Independently reviewed.   Assessment/Plan Principal Problem:   AKI (acute kidney injury) (HCC) Active Problems:   Uncontrolled hypertension   Abnormal weight loss   Food intolerance in adult   FTT (failure to thrive) in adult   Generalized abdominal pain   Diabetes (HCC)   Diverticulosis   Gastroesophageal reflux disease with esophagitis   History of adenomatous polyp of colon   Hyperlipidemia   Hypothyroidism   Uncontrolled type 2 diabetes mellitus with hyperglycemia (HCC)   Depression   Normocytic anemia   Generalized abdominal pain - cause undetermined - CT abdomen/pelvis unrevealing - associated with poor oral intake and unintentional weight loss - GI consultation requested to assist with diagnosis/workup - clear liquid diet for now as tolerated  - further recommendations to follow  Abnormal weight loss - cause unknown - further work up to follow - GI consultation - check TSH  Type 2 diabetes mellitus with unspecified complications - check A1c - SSI coverage as ordered - CBG monitoring  Hypothyroidism  - follow up TSH - resume home levothyroxine    Uncontrolled hypertension  - IV hydralazine  ordered - hold home ARB due to AKI   Nausea and vomiting  - reports food intolerance  - GI consult to consider for endoscopy - IV nausea medication as needed - pantoprazole  ordered - follow up urinalysis   GERD - pantoprazole  40 mg daily ordered for GI protection   AKI  - prerenal from poor oral intake - IV fluid hydration ordered - recheck BMP in AM    Depression  - resume home medication   Adult Failure to thrive  - consult dietitian   Severe protein calorie malnutrition  - consult to dietitian   Normocytic anemia  - check full  Anemia panel   Hyperlipidemia - resume home lipid lowering drug when reconciled home meds from pharmacy  DVT prophylaxis: heparin    Code Status: Full   Family Communication:   Disposition Plan: anticipate home   Consults called: GI   Admission status: INP  Level of care: Med-Surg Afton Louder MD [facility] How to contact the [facility] Attending or Consulting provider 7A - 7P or covering provider during after hours 7P -7A, for this patient?  Check the care team in [facility] and look for a) attending/consulting TR[facility] provider listed and b) the TR[facility] team listed Log into [email or web address] and use [facility] universal password to access. If you do not have the password, please contact the hospital operator. Locate  the TR[facility] provider you are looking for under [facility] and page to a number that you can be directly reached. If you still have difficulty reaching the provider, please page the [facility] (Director on Call) for the Hospitalists listed on amion for assistance.   If 7PM-7AM, please contact night-coverage [email or web address] Password [facility]  10/13/2023, 12:36 PM

## 2023-10-13 NOTE — ED Notes (Signed)
 ED TO INPATIENT HANDOFF REPORT  ED Nurse Name and Phone #:  Vicenta Valery RAMAN Name/Age/Gender [name] 78 y.o. female Room/Bed: APA18/APA18  Code Status   Code Status: Prior  Home/SNF/Other Home Patient oriented to: self, place, time, and situation Is this baseline? Yes   Triage Complete: Triage complete  Chief Complaint AKI (acute kidney injury) (HCC) [N17.9]  Triage Note Pt arrives with daughter who repots that pt has been c/o abdominal pain for 3 months states that patient was here about a month ago and refused scan because of wait. Daughter reports pt has been vomiting after eating. Pt also endorses vomiting in the morning. Pt points to middle/lower abdomen when asked where pain is. Last BM today, pt reports WNL.    Allergies No Known Allergies  Level of Care/Admitting Diagnosis ED Disposition     ED Disposition  Admit   Condition  --   Comment  Hospital Area: [facility] [[id number]]  Level of Care: Med-Surg [16]  Covid Evaluation: Asymptomatic - no recent exposure (last 10 days) testing not required  Diagnosis: AKI (acute kidney injury) [facility]) [[id number]]  Admitting Physician: VICCI AFTON CROME [[id number]]  Attending Physician: VICCI AFTON CROME [[id number]]  Certification:: I certify this patient will need inpatient services for at least 2 midnights  Expected Medical Readiness: 10/16/2023          B Medical/Surgery History Past Medical History:  Diagnosis Date   Diabetes mellitus without complication (HCC)    High cholesterol    Hypertension    Past Surgical History:  Procedure Laterality Date   FOOT SURGERY       A IV Location/Drains/Wounds Patient Lines/Drains/Airways Status     Active Line/Drains/Airways     Name Placement date Placement time Site Days   Peripheral IV 10/13/23 22 G Left;Posterior Forearm 10/13/23  0639  Forearm  less than 1            Intake/Output Last 24 hours No intake or output data in the 24 hours ending 10/13/23  0816  Labs/Imaging Results for orders placed or performed during the hospital encounter of 10/12/23 (from the past 48 hours)  Lipase, blood     Status: None   Collection Time: 10/12/23  8:10 PM  Result Value Ref Range   Lipase 35 11 - 51 U/L    Comment: Performed at [facility], [address], [place], [place] [address]  Comprehensive metabolic panel     Status: Abnormal   Collection Time: 10/12/23  8:10 PM  Result Value Ref Range   Sodium 130 (L) 135 - 145 mmol/L   Potassium 3.8 3.5 - 5.1 mmol/L   Chloride 93 (L) 98 - 111 mmol/L   CO2 25 22 - 32 mmol/L   Glucose, Bld 277 (H) 70 - 99 mg/dL    Comment: Glucose reference range applies only to samples taken after fasting for at least 8 hours.   BUN 29 (H) 8 - 23 mg/dL   Creatinine, Ser 7.60 (H) 0.44 - 1.00 mg/dL   Calcium 8.8 (L) 8.9 - 10.3 mg/dL   Total Protein 6.3 (L) 6.5 - 8.1 g/dL   Albumin 3.7 3.5 - 5.0 g/dL   AST 17 15 - 41 U/L   ALT 13 0 - 44 U/L   Alkaline Phosphatase 33 (L) 38 - 126 U/L   Total Bilirubin 0.6 0.0 - 1.2 mg/dL   GFR, Estimated 20 (L) >60 mL/min    Comment: (NOTE) Calculated using the CKD-EPI Creatinine  Equation (2021)    Anion gap 12 5 - 15    Comment: Performed at [facility], [address], [place], [place] [address]  CBC     Status: Abnormal   Collection Time: 10/12/23  8:10 PM  Result Value Ref Range   WBC 9.4 4.0 - 10.5 K/uL   RBC 3.62 (L) 3.87 - 5.11 MIL/uL   Hemoglobin 10.5 (L) 12.0 - 15.0 g/dL   HCT 67.4 (L) 63.9 - 53.9 %   MCV 89.8 80.0 - 100.0 fL   MCH 29.0 26.0 - 34.0 pg   MCHC 32.3 30.0 - 36.0 g/dL   RDW 85.6 88.4 - 84.4 %   Platelets 346 150 - 400 K/uL   nRBC 0.0 0.0 - 0.2 %    Comment: Performed at [facility], [address], [place], [place] [address]   CT ABDOMEN PELVIS WO CONTRAST Result Date: 10/13/2023 CLINICAL DATA:  Acute, nonlocalized abdominal pain. Abdominal pain for 3 months. EXAM: CT ABDOMEN AND PELVIS WITHOUT CONTRAST TECHNIQUE: Multidetector CT imaging of the abdomen  and pelvis was performed following the standard protocol without IV contrast. RADIATION DOSE REDUCTION: This exam was performed according to the departmental dose-optimization program which includes automated exposure control, adjustment of the mA and/or kV according to patient size and/or use of iterative reconstruction technique. COMPARISON:  None Available. FINDINGS: Lower chest: Breast implant on the right with signs of intracapsular rupture on a chronic basis. Extensive atheromatous calcification of the aorta and coronaries. Small sliding hiatal hernia. No acute finding Hepatobiliary: No focal liver abnormality.No evidence of biliary obstruction or stone. Pancreas: Unremarkable. Spleen: Unremarkable. Adrenals/Urinary Tract: Thickening of the right adrenal gland to 15 mm with low densitometry consistent with adenoma. No hydronephrosis or stone. Unremarkable bladder. Stomach/Bowel: No obstruction or abnormal stool retention. No appendicitis. Vascular/Lymphatic: No acute vascular abnormality. Extensive atheromatous calcification of the aorta and branch vessels. No mass or adenopathy. Reproductive:Hysterectomy. Other: No ascites or pneumoperitoneum. Musculoskeletal: No acute abnormalities. Generalized spinal degeneration with lumbar dextroscoliosis. IMPRESSION: 1. No acute finding or specific cause for symptoms. 2. Extensive atherosclerosis. Electronically Signed   By: Dorn Roulette M.D.   On: 10/13/2023 07:26    Pending Labs Unresulted Labs (From admission, onward)     Start     Ordered   10/12/23 1950  Urinalysis, Routine w reflex microscopic -Urine, Clean Catch  Once,   URGENT       Question:  Specimen Source  Answer:  Urine, Clean Catch   10/12/23 1950            Vitals/Pain Today's Vitals   10/12/23 1946 10/13/23 0209 10/13/23 0650 10/13/23 0654  BP: (!) 161/78 (!) 182/76 (!) 191/71   Pulse: 76 65 61   Resp: 16 17 17    Temp: 98.2 F (36.8 C) 99 F (37.2 C)  97.9 F (36.6 C)   TempSrc: Oral Oral  Oral  SpO2: 98% 98% 98%   Weight: 60.8 kg     Height: 5' (1.524 m)     PainSc: 6        Isolation Precautions No active isolations  Medications Medications  sodium chloride  0.9 % bolus 1,000 mL (1,000 mLs Intravenous New Bag/Given 10/13/23 0640)    Mobility walks        R Recommendations: See Admitting Provider Note  Report given to: [place]

## 2023-10-13 NOTE — H&P (View-Only) (Signed)
 Toribio Fortune, M.D. Gastroenterology & Hepatology                                           Patient Name: [name] Account #: @FLAACCTNO @   MRN: [id number] Admission Date: 10/12/2023 Date of Evaluation:  10/13/2023 Time of Evaluation: 11:44 AM   Referring Physician: Afton Louder, MD  Chief Complaint: Abdominal pain and weight loss, decreased oral intake  HPI:  This is a 78 y.o. female with history of diabetes, hyperlipidemia and hypertension, who was brought to the hospital by her daughters after presenting abdominal pain, vomiting and poor oral intake with weight loss.  The patient and daughters report that the patient has presented a 81-month interval of intermittent diffuse abdominal cramping which is not related to any specific food esophagogastroduodenospy, as well as poor oral intake and nausea with vomiting episodes, even if he has not eaten.  States that the vomiting episodes are happening on a daily basis.  Has lost close to 15 pounds since her symptoms started.  States that she also had some episodes of diarrhea when her symptoms started initially, but her stools have normalized recently.  No urinary symptoms.  Daughter states that she recently had a blood pressure making out of her who her least but this was added after she started having symptoms.  Only blood pressure listed is amlodipine .  Patient has been seeing Dr. Rosalie (GI) in [place], last EGD and colonoscopy in 2019. EGD was found to have inflammation at the GE junction. 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 The only other finding was presbyesophagus.  Colonoscopy was reported to have an adenoma and was recommended to have a repeat colonoscopy in 2024.  In the ED, she was HD stable and afebrile. Labs were remarkable for creatinine 2.39, BUN 29, sodium 130, chloride 93, rest of electrolytes within normal limits, glucose 277, normal LFTs, CBC with hemoglobin 10.5, WBC 9.4 and platelets 346.  CT of the  abdomen and pelvis without IV contrast showed extensive atherosclerosis but no other abnormalities.  Urinalysis showed large leukocytes but no other abnormalities   Patient denies any smoking or drinking alcohol.  Had a mild had a malignancy in the past.  Past Medical History: SEE CHRONIC ISSSUES: Past Medical History:  Diagnosis Date   Diabetes mellitus without complication (HCC)    High cholesterol    Hypertension    Past Surgical History:  Past Surgical History:  Procedure Laterality Date   FOOT SURGERY     Family History: History reviewed. No pertinent family history. Social History:  Social History   Tobacco Use   Smoking status: Never   Smokeless tobacco: Never  Vaping Use   Vaping status: Never Used  Substance Use Topics   Alcohol use: Never   Drug use: Never    Home Medications:  Prior to Admission medications   Medication Sig Start Date End Date Taking? Authorizing Provider  ACCU-CHEK AVIVA PLUS test strip 1 each by Other route 4 (four) times daily.  01/25/18   [provider]  ACCU-CHEK FASTCLIX LANCETS MISC U TO TEST QID 01/21/18   [provider]  amLODipine  (NORVASC ) 2.5 MG tablet Take 2.5 mg by mouth daily. 08/06/20   [provider]  ascorbic acid  (VITAMIN C) 500 MG tablet Take 1 tablet (500 mg total) by mouth daily. X 5 days 08/06/20   Tat,  Alm, MD  aspirin  325 MG tablet Take 325 mg by mouth daily.    [provider]  BIOTIN PO Take by mouth.    [provider]  Calcium Citrate (CITRACAL PO) Take 1 tablet by mouth daily.     [provider]  calcium-vitamin D  (OSCAL WITH D) 500-5 MG-MCG tablet Take 1 tablet by mouth.    [provider]  cetirizine  (ZYRTEC  ALLERGY) 10 MG tablet Take 1 tablet (10 mg total) by mouth daily. 12/18/21   Christopher Savannah, PA-C  Cholecalciferol (VITAMIN D  PO) Take 1 tablet by mouth daily.     [provider]  co-enzyme Q-10 30 MG capsule Take 30 mg by mouth 3 (three)  times daily.    [provider]  Cyanocobalamin  (VITAMIN B 12 PO) Take by mouth.    [provider]  ELDERBERRY PO Take by mouth.    [provider]  escitalopram  (LEXAPRO ) 10 MG tablet Take 10 mg by mouth daily.  01/20/18   [provider]  esomeprazole (NEXIUM) 20 MG capsule Take 20 mg by mouth daily at 12 noon. Patient not taking: Reported on 11/25/2022    [provider]  fesoterodine  (TOVIAZ ) 8 MG TB24 tablet Take 1 tablet (8 mg total) by mouth daily. 11/25/22   McKenzie, Belvie CROME, MD  furosemide (LASIX) 10 MG/ML injection  08/06/20   [provider]  furosemide (LASIX) 20 MG tablet Take 20 mg by mouth daily as needed. 08/28/20   [provider]  glimepiride (AMARYL) 2 MG tablet Take 4 mg by mouth daily.  01/21/18   [provider]  hydrochlorothiazide  (HYDRODIURIL ) 25 MG tablet Take 25 mg by mouth daily.  01/21/18   [provider]  ibuprofen  (ADVIL ) 800 MG tablet Take 1 tablet (800 mg total) by mouth every 8 (eight) hours as needed. Patient not taking: Reported on 11/25/2022 10/20/21   Margrette Taft BRAVO, MD  JANUMET  50-500 MG tablet Take 1 tablet by mouth 2 (two) times daily. 09/25/21   [provider]  levothyroxine  (SYNTHROID , LEVOTHROID) 100 MCG tablet Take 100 mcg by mouth daily.  01/21/18   [provider]  loperamide  (IMODIUM ) 2 MG capsule Take 1 capsule (2 mg total) by mouth 2 (two) times daily as needed for diarrhea or loose stools. 08/18/23   Neysa Caron PARAS, DO  losartan  (COZAAR ) 100 MG tablet Take 100 mg by mouth daily.  11/17/17   [provider]  Omega-3 Fatty Acids (FISH OIL PO) Take by mouth. Patient not taking: Reported on 11/25/2022    [provider]  ondansetron  (ZOFRAN ) 4 MG tablet Take 1 tablet (4 mg total) by mouth every 6 (six) hours. 08/18/23   Neysa Caron PARAS, DO  ondansetron  (ZOFRAN -ODT) 8 MG disintegrating tablet Take 1 tablet (8 mg total) by mouth every 8  (eight) hours as needed for nausea or vomiting. Patient not taking: Reported on 11/25/2022 12/18/21   Christopher Savannah, PA-C  pantoprazole  (PROTONIX ) 40 MG tablet Take 40 mg by mouth daily. 09/02/20   [provider]  promethazine -dextromethorphan  (PROMETHAZINE -DM) 6.25-15 MG/5ML syrup Take 5 mLs by mouth at bedtime as needed for cough. Patient not taking: Reported on 11/25/2022 12/18/21   Christopher Savannah, PA-C  simvastatin  (ZOCOR ) 40 MG tablet Take 40 mg by mouth daily.  01/21/18   [provider]  zinc  sulfate 220 (50 Zn) MG capsule Take 1 capsule (220 mg total) by mouth daily. X 5 days Patient not taking: Reported on 11/25/2022 08/06/20  Evonnie Lenis, MD    Inpatient Medications:  Current Facility-Administered Medications:    0.9 %  sodium chloride  infusion, , Intravenous, Continuous, Johnson, Clanford L, MD   acetaminophen  (TYLENOL ) tablet 650 mg, 650 mg, Oral, Q6H PRN **OR** acetaminophen  (TYLENOL ) suppository 650 mg, 650 mg, Rectal, Q6H PRN, Johnson, Clanford L, MD   bisacodyl  (DULCOLAX) EC tablet 5 mg, 5 mg, Oral, Daily PRN, Johnson, Clanford L, MD   heparin  injection 5,000 Units, 5,000 Units, Subcutaneous, Q8H, Johnson, Clanford L, MD   hydrALAZINE  (APRESOLINE ) injection 10 mg, 10 mg, Intravenous, Q4H PRN, Vicci, Clanford L, MD, 10 mg at 10/13/23 9048   HYDROmorphone  (DILAUDID ) injection 0.25 mg, 0.25 mg, Intravenous, Q2H PRN, Johnson, Clanford L, MD   insulin  aspart (novoLOG ) injection 0-9 Units, 0-9 Units, Subcutaneous, TID WC, Johnson, Clanford L, MD   ondansetron  (ZOFRAN ) tablet 4 mg, 4 mg, Oral, Q6H PRN **OR** ondansetron  (ZOFRAN ) injection 4 mg, 4 mg, Intravenous, Q6H PRN, Johnson, Clanford L, MD   oxyCODONE  (Oxy IR/ROXICODONE ) immediate release tablet 5 mg, 5 mg, Oral, Q4H PRN, Johnson, Clanford L, MD   pantoprazole  (PROTONIX ) EC tablet 40 mg, 40 mg, Oral, Q0600, Johnson, Clanford L, MD, 40 mg at 10/13/23 9048 Allergies: Patient has no known allergies.  Complete Review of  Systems: GENERAL: negative for malaise, night sweats HEENT: No changes in hearing or vision, no nose bleeds or other nasal problems. NECK: Negative for lumps, goiter, pain and significant neck swelling RESPIRATORY: Negative for cough, wheezing CARDIOVASCULAR: Negative for chest pain, leg swelling, palpitations, orthopnea GI: SEE HPI MUSCULOSKELETAL: Negative for joint pain or swelling, back pain, and muscle pain. SKIN: Negative for lesions, rash PSYCH: Negative for sleep disturbance, mood disorder and recent psychosocial stressors. HEMATOLOGY Negative for prolonged bleeding, bruising easily, and swollen nodes. ENDOCRINE: Negative for cold or heat intolerance, polyuria, polydipsia and goiter. NEURO: negative for tremor, gait imbalance, syncope and seizures. The remainder of the review of systems is noncontributory.  Physical Exam: BP (!) 177/60 (BP Location: Left Arm)   Pulse 65   Temp 98 F (36.7 C) (Oral)   Resp 18   Ht 5' (1.524 m)   Wt 60.8 kg   SpO2 100%   BMI 26.17 kg/m  GENERAL: The patient is AO x3, in no acute distress. HEENT: Head is normocephalic and atraumatic. EOMI are intact. Mouth is well hydrated and without lesions. NECK: Supple. No masses LUNGS: Clear to auscultation. No presence of rhonchi/wheezing/rales. Adequate chest expansion HEART: RRR, normal s1 and s2. ABDOMEN: Soft, nontender, no guarding, no peritoneal signs, and nondistended. BS +. No masses. EXTREMITIES: Without any cyanosis, clubbing, rash, lesions or edema. NEUROLOGIC: AOx3, no focal motor deficit. SKIN: no jaundice, no rashes  Laboratory Data CBC:     Component Value Date/Time   WBC 9.4 10/12/2023 2010   RBC 3.62 (L) 10/12/2023 2010   HGB 10.5 (L) 10/12/2023 2010   HCT 32.5 (L) 10/12/2023 2010   PLT 346 10/12/2023 2010   MCV 89.8 10/12/2023 2010   MCH 29.0 10/12/2023 2010   MCHC 32.3 10/12/2023 2010   RDW 14.3 10/12/2023 2010   LYMPHSABS 1.3 08/04/2020 0639   MONOABS 0.6 08/04/2020  0639   EOSABS 0.0 08/04/2020 0639   BASOSABS 0.0 08/04/2020 0639   COAG:  Lab Results  Component Value Date   INR 1.1 07/30/2020    BMP:     Latest Ref Rng & Units 10/12/2023    8:10 PM 08/18/2023    6:22 PM 08/04/2020    6:39 AM  BMP  Glucose 70 - 99 mg/dL 722  805  72   BUN 8 - 23 mg/dL 29  21  12    Creatinine 0.44 - 1.00 mg/dL 7.60  8.68  9.43   Sodium 135 - 145 mmol/L 130  131  132   Potassium 3.5 - 5.1 mmol/L 3.8  3.2  4.3   Chloride 98 - 111 mmol/L 93  88  98   CO2 22 - 32 mmol/L 25  26  26    Calcium 8.9 - 10.3 mg/dL 8.8  9.1  8.2     HEPATIC:     Latest Ref Rng & Units 10/12/2023    8:10 PM 08/18/2023    6:22 PM 08/04/2020    6:39 AM  Hepatic Function  Total Protein 6.5 - 8.1 g/dL 6.3  6.8  5.5   Albumin 3.5 - 5.0 g/dL 3.7  4.2  2.9   AST 15 - 41 U/L 17  22  40   ALT 0 - 44 U/L 13  13  38   Alk Phosphatase 38 - 126 U/L 33  40  36   Total Bilirubin 0.0 - 1.2 mg/dL 0.6  0.4  1.0     CARDIAC: No results found for: CKTOTAL, CKMB, CKMBINDEX, TROPONINI   Imaging: I personally reviewed and interpreted the available imaging.  Assessment & Plan: SOUMYA COLSON is a 78 y.o. female with history of diabetes, hyperlipidemia and hypertension, who was brought to the hospital by her daughters after presenting abdominal pain, vomiting and poor oral intake with weight loss.  Patient has presented persistent symptoms for the last 3 months.  She actually developed some AKI due to poor oral intake based on labs.  It is unclear why she has presented the symptoms.  Cross-sectional abdominal imaging was unremarkable for any etiology.  We will evaluate this further with an EGD tomorrow.  She actually is due for surveillance colonoscopy, which could be considered to be performed as outpatient if symptoms labs stabilize during the current hospitalization.  She will need to have further IV hydration to improve her kidney function.  Other considerations include chronic mesenteric ischemia  given extensive atherosclerosis, which may need further evaluation with CT angio abdomen and pelvis once renal function improves.  - IV fluid hydration -Proceed with EGD tomorrow -Zofran  as needed for nausea control -May consider CT angio abdomen and pelvis with IV contrast if symptoms persist and negative esophagogastroduodenospy. -Possible outpatient colonoscopy with Dr. Rosalie Toribio Fortune, MD Gastroenterology and Hepatology [facility] Gastroenterology

## 2023-10-14 ENCOUNTER — Encounter (HOSPITAL_COMMUNITY): Payer: Self-pay | Admitting: Family Medicine

## 2023-10-14 ENCOUNTER — Inpatient Hospital Stay (HOSPITAL_COMMUNITY): Payer: Medicare Other | Admitting: Anesthesiology

## 2023-10-14 ENCOUNTER — Encounter (HOSPITAL_COMMUNITY)
Admission: EM | Disposition: A | Discharge status: Home / Self Care | Payer: Self-pay | Source: Home / Self Care | Attending: Family Medicine

## 2023-10-14 DIAGNOSIS — E785 Hyperlipidemia, unspecified: Secondary | ICD-10-CM | POA: Diagnosis not present

## 2023-10-14 DIAGNOSIS — K2970 Gastritis, unspecified, without bleeding: Secondary | ICD-10-CM

## 2023-10-14 DIAGNOSIS — E440 Moderate protein-calorie malnutrition: Secondary | ICD-10-CM | POA: Insufficient documentation

## 2023-10-14 DIAGNOSIS — K3189 Other diseases of stomach and duodenum: Secondary | ICD-10-CM | POA: Diagnosis not present

## 2023-10-14 DIAGNOSIS — E039 Hypothyroidism, unspecified: Secondary | ICD-10-CM

## 2023-10-14 DIAGNOSIS — K449 Diaphragmatic hernia without obstruction or gangrene: Secondary | ICD-10-CM

## 2023-10-14 DIAGNOSIS — K9049 Malabsorption due to intolerance, not elsewhere classified: Secondary | ICD-10-CM | POA: Diagnosis not present

## 2023-10-14 DIAGNOSIS — R627 Adult failure to thrive: Secondary | ICD-10-CM | POA: Diagnosis not present

## 2023-10-14 DIAGNOSIS — N179 Acute kidney failure, unspecified: Secondary | ICD-10-CM | POA: Diagnosis not present

## 2023-10-14 DIAGNOSIS — R1084 Generalized abdominal pain: Secondary | ICD-10-CM

## 2023-10-14 HISTORY — PX: ESOPHAGOGASTRODUODENOSCOPY (EGD) WITH PROPOFOL: SHX5813

## 2023-10-14 HISTORY — PX: BIOPSY: SHX5522

## 2023-10-14 LAB — COMPREHENSIVE METABOLIC PANEL
ALT: 12 U/L (ref 0–44)
AST: 13 U/L — ABNORMAL LOW (ref 15–41)
Albumin: 3.2 g/dL — ABNORMAL LOW (ref 3.5–5.0)
Alkaline Phosphatase: 27 U/L — ABNORMAL LOW (ref 38–126)
Anion gap: 10 (ref 5–15)
BUN: 22 mg/dL (ref 8–23)
CO2: 23 mmol/L (ref 22–32)
Calcium: 8.5 mg/dL — ABNORMAL LOW (ref 8.9–10.3)
Chloride: 102 mmol/L (ref 98–111)
Creatinine, Ser: 1.92 mg/dL — ABNORMAL HIGH (ref 0.44–1.00)
GFR, Estimated: 26 mL/min — ABNORMAL LOW (ref >60)
Glucose, Bld: 143 mg/dL — ABNORMAL HIGH (ref 70–99)
Potassium: 3.1 mmol/L — ABNORMAL LOW (ref 3.5–5.1)
Sodium: 135 mmol/L (ref 135–145)
Total Bilirubin: 0.7 mg/dL (ref 0.0–1.2)
Total Protein: 5.5 g/dL — ABNORMAL LOW (ref 6.5–8.1)

## 2023-10-14 LAB — CBC
HCT: 28.2 % — ABNORMAL LOW (ref 36.0–46.0)
Hemoglobin: 9.1 g/dL — ABNORMAL LOW (ref 12.0–15.0)
MCH: 28.7 pg (ref 26.0–34.0)
MCHC: 32.3 g/dL (ref 30.0–36.0)
MCV: 89 fL (ref 80.0–100.0)
Platelets: 281 10*3/uL (ref 150–400)
RBC: 3.17 MIL/uL — ABNORMAL LOW (ref 3.87–5.11)
RDW: 14.4 % (ref 11.5–15.5)
WBC: 8.2 10*3/uL (ref 4.0–10.5)
nRBC: 0 % (ref 0.0–0.2)

## 2023-10-14 LAB — GLUCOSE, CAPILLARY
Glucose-Capillary: 121 mg/dL — ABNORMAL HIGH (ref 70–99)
Glucose-Capillary: 135 mg/dL — ABNORMAL HIGH (ref 70–99)
Glucose-Capillary: 141 mg/dL — ABNORMAL HIGH (ref 70–99)
Glucose-Capillary: 153 mg/dL — ABNORMAL HIGH (ref 70–99)
Glucose-Capillary: 235 mg/dL — ABNORMAL HIGH (ref 70–99)
Glucose-Capillary: 165 mg/dL — ABNORMAL HIGH (ref 70–99)

## 2023-10-14 LAB — HEMOGLOBIN A1C
Hgb A1c MFr Bld: 7.1 % — ABNORMAL HIGH (ref 4.8–5.6)
Mean Plasma Glucose: 157 mg/dL

## 2023-10-14 LAB — MAGNESIUM: Magnesium: 1.2 mg/dL — ABNORMAL LOW (ref 1.7–2.4)

## 2023-10-14 SURGERY — ESOPHAGOGASTRODUODENOSCOPY (EGD) WITH PROPOFOL
Anesthesia: General

## 2023-10-14 MED ORDER — PROPOFOL 10 MG/ML IV BOLUS
INTRAVENOUS | Status: DC | PRN
  Administered 2023-10-14: 30 mg via INTRAVENOUS
  Administered 2023-10-14: 80 mg via INTRAVENOUS

## 2023-10-14 MED ORDER — PROPOFOL 500 MG/50ML IV EMUL
INTRAVENOUS | Status: DC | PRN
  Administered 2023-10-14: 125 ug/kg/min via INTRAVENOUS

## 2023-10-14 MED ORDER — SODIUM CHLORIDE 0.9 % IV SOLN: INTRAVENOUS | Status: DC | PRN

## 2023-10-14 MED ORDER — LIDOCAINE HCL (CARDIAC) PF 100 MG/5ML IV SOSY
PREFILLED_SYRINGE | INTRAVENOUS | Status: DC | PRN
  Administered 2023-10-14: 80 mg via INTRAVENOUS

## 2023-10-14 MED ORDER — MAGNESIUM SULFATE 4 GM/100ML IV SOLN
4.0000 g | Freq: Once | INTRAVENOUS | Status: AC
  Administered 2023-10-14: 4 g via INTRAVENOUS
  Filled 2023-10-14: qty 100

## 2023-10-14 MED ORDER — HEPARIN SODIUM (PORCINE) 5000 UNIT/ML IJ SOLN
5000.0000 [IU] | Freq: Three times a day (TID) | INTRAMUSCULAR | Status: DC
  Administered 2023-10-14 – 2023-10-15 (×2): 5000 [IU] via SUBCUTANEOUS
  Filled 2023-10-14 (×2): qty 1

## 2023-10-14 MED ORDER — POTASSIUM CHLORIDE 10 MEQ/100ML IV SOLN
10.0000 meq | INTRAVENOUS | Status: AC
  Administered 2023-10-14 (×4): 10 meq via INTRAVENOUS
  Filled 2023-10-14: qty 100

## 2023-10-14 NOTE — Progress Notes (Addendum)
 Initial Nutrition Assessment  DOCUMENTATION CODES:   Non-severe (moderate) malnutrition in context of acute illness/injury  INTERVENTION:   When diet advanced, add Boost Breeze po TID, each supplement provides 250 kcal and 9 grams of protein  NUTRITION DIAGNOSIS:   Moderate Malnutrition related to acute illness (abdominal pain with unknown etiology) as evidenced by mild fat depletion, mild muscle depletion, energy intake < 75% for > 7 days.  GOAL:   Patient will meet greater than or equal to 90% of their needs  MONITOR:   Diet advancement, PO intake, Supplement acceptance  REASON FOR ASSESSMENT:   Malnutrition Screening Tool    ASSESSMENT:   78 yo female admitted with AKI, FTT, abdominal pain. PMH includes DM, HLD, HTN.  CT abd/pelvis unrevealing. GI consulting with plans for EGD today. Currently NPO. She was on clear liquids yesterday, 75-80% POs.  Patient reports that she has lost approximately 15 lbs over the past several months (usual weight 150 lbs, currently 139.77 lbs) r/t not being able to eat anything. She has been taking bites and sips, but no substantial intake for several weeks. She has tried PO supplements, but the last time she had one, she vomited, so she does not want anymore milky supplements. She agreed to try a juice supplement when her diet is advanced.   Weight history reviewed. No significant weight changes over the past 2 months. She has had a 7% weight loss over an unknown time frame.  08/01/22 68 kg 08/18/23: 63.5 kg 10/13/22: 63.4 kg  Labs reviewed. K 3.1, mag 1.2 CBG: 121-135  Medications reviewed and include novolog , protonix , IV mag sulfate, IV potassium chloride .  Patient meets criteria for moderate malnutrition, given mild depletion of muscle and subcutaneous fat mass.  NUTRITION - FOCUSED PHYSICAL EXAM:  Flowsheet Row Most Recent Value  Orbital Region Mild depletion  Upper Arm Region Mild depletion  Thoracic and Lumbar Region No  depletion  Buccal Region No depletion  Temple Region Mild depletion  Clavicle Bone Region Mild depletion  Clavicle and Acromion Bone Region No depletion  Scapular Bone Region No depletion  Dorsal Hand Mild depletion  Patellar Region Mild depletion  Anterior Thigh Region Mild depletion  Posterior Calf Region Mild depletion  Edema (RD Assessment) None  Hair Reviewed  Eyes Reviewed  Mouth Reviewed  Skin Reviewed  Nails Reviewed       Diet Order:   Diet Order             Diet NPO time specified  Diet effective now                   EDUCATION NEEDS:   Not appropriate for education at this time  Skin:  Skin Assessment: Reviewed RN Assessment  Last BM:  1/1  Height:   Ht Readings from Last 1 Encounters:  10/14/23 5' (1.524 m)    Weight:   Wt Readings from Last 1 Encounters:  10/14/23 63.4 kg    BMI:  Body mass index is 27.3 kg/m.  Estimated Nutritional Needs:   Kcal:  1400-1600  Protein:  70-80 gm  Fluid:  >/= 1.5 L   Suzen HUNT RD, LDN, CNSC Contact Inpatient RD using group chat RD Inpatient via Secure Chat in EPIC

## 2023-10-14 NOTE — Transfer of Care (Signed)
 Immediate Anesthesia Transfer of Care Note  Patient: [name]  Procedure(s) Performed: ESOPHAGOGASTRODUODENOSCOPY (EGD) WITH PROPOFOL  BIOPSY  Patient Location: PACU  Anesthesia Type:General  Level of Consciousness: drowsy and patient cooperative  Airway & Oxygen  Therapy: Patient Spontanous Breathing and Patient connected to nasal cannula oxygen   Post-op Assessment: Report given to RN and Post -op Vital signs reviewed and stable  Post vital signs: Reviewed and stable  Last Vitals:  Vitals Value Taken Time  BP 111/80 10/14/23 1245  Temp 36.6 C 10/14/23 1226  Pulse 61 10/14/23 1259  Resp 19 10/14/23 1259  SpO2 99 % 10/14/23 1259  Vitals shown include unfiled device data.  Last Pain:  Vitals:   10/14/23 1206  TempSrc:   PainSc: 0-No pain      Patients Stated Pain Goal: 2 (10/13/23 0948)  Complications: No notable events documented.

## 2023-10-14 NOTE — Anesthesia Preprocedure Evaluation (Signed)
 Anesthesia Evaluation  Patient identified by MRN, date of birth, ID band Patient awake    Reviewed: Allergy & Precautions, H&P , NPO status , Patient's Chart, lab work & pertinent test results, reviewed documented beta blocker date and time   Airway Mallampati: II  TM Distance: >3 FB Neck ROM: full    Dental no notable dental hx.    Pulmonary neg pulmonary ROS   Pulmonary exam normal breath sounds clear to auscultation       Cardiovascular Exercise Tolerance: Good hypertension,  Rhythm:regular Rate:Normal     Neuro/Psych  PSYCHIATRIC DISORDERS  Depression    negative neurological ROS     GI/Hepatic negative GI ROS, Neg liver ROS,,,  Endo/Other  diabetesHypothyroidism    Renal/GU Renal disease  negative genitourinary   Musculoskeletal   Abdominal   Peds  Hematology  (+) Blood dyscrasia, anemia   Anesthesia Other Findings   Reproductive/Obstetrics negative OB ROS                             Anesthesia Physical Anesthesia Plan  ASA: 2  Anesthesia Plan: General   Post-op Pain Management:    Induction:   PONV Risk Score and Plan: Propofol  infusion  Airway Management Planned:   Additional Equipment:   Intra-op Plan:   Post-operative Plan:   Informed Consent: I have reviewed the patients History and Physical, chart, labs and discussed the procedure including the risks, benefits and alternatives for the proposed anesthesia with the patient or authorized representative who has indicated his/her understanding and acceptance.     Dental Advisory Given  Plan Discussed with: CRNA  Anesthesia Plan Comments:        Anesthesia Quick Evaluation

## 2023-10-14 NOTE — Progress Notes (Signed)
 Patient underwent EGD under propofol  sedation.  Tolerated the procedure adequately.   FINDINGS:  - 5 cm hiatal hernia.  - Congested and texture changed mucosa in the cardia. This was not biopsied as isolated gastric varices could not be excluded  - Erythematous mucosa in the antrum. Biopsied.  - Normal duodenal bulb and second portion of the duodenum.  RECOMMENDATIONS  - Patient has a contact number available for emergencies. The signs and symptoms of potential delayed complications were discussed with the patient. Return to normal activities tomorrow. Written discharge instructions were provided to the patient. - Resume previous diet.   - Continue present medications.   - Await pathology results.   - Patient to follow up with Primary GI Dr Rosalie   - May consider CT angio abdomen and pelvis with IV contrast if symptoms persist to assess for chronic mesentric ischemia given extensive atherosclerosis   - Possible outpatient colonoscopy with Dr. Rosalie   - Consider EUS / Biopsy of edematous cardiac fold  Please recall GI with any other questions   Girlie Veltri Faizan Shiela Bruns, MD Gastroenterology and Hepatology [facility] Gastroenterology

## 2023-10-14 NOTE — Progress Notes (Signed)
 PROGRESS NOTE   [name]  FMW:[id number] DOB: 05-03-1946 DOA: 10/12/2023 PCP: Marvine Rush, MD   Chief Complaint  Patient presents with   Abdominal Pain   Level of care: Med-Surg  Brief Admission History:  78 year old female with type 2 diabetes mellitus, hypertension, hypothyroidism, hyperlipidemia, diverticulosis, GERD, history of adenomatous colon polyps, depression was brought into the emergency department with daughter complaining of 3 months of intermittent abdominal pain and reportedly vomiting after she eats meals.  She has lost a significant amount of weight.  She reports that the pain in the abdomen is middle to lower abdomen and generalized at times.  She reports that symptoms initially started off several months ago with diarrhea which has resolved.  She reports losing about 15 pounds unintentional weight loss.  She reports vomiting after eating.  She denies fever and chills.  In the ED she was sent for CT scan but it did not show any acute findings.  She did have some abnormal labs including sodium of 130 and creatinine of 2.39 consistent with an AKI.  She was noted to have a normocytic anemia.  Admission was requested for further workup of her idiopathic abdominal pain and treatment of AKI.   Assessment and Plan:  Generalized abdominal pain - cause undetermined - CT abdomen/pelvis unrevealing - associated with poor oral intake and unintentional weight loss - GI consultation requested to assist with diagnosis/workup - dys 3 diet ordered 1/2 after EGD - further recommendations to follow - GI team considering CTA abdomen to eval for chronic mesenteric ischemia    Abnormal weight loss - cause unknown but suspect chronic poor oral intake - further work up to follow - GI consultation appreciated; EGD planned for 1/2 - check TSH--1.554    Type 2 diabetes mellitus with unspecified complications - check A1c - SSI coverage as ordered - CBG monitoring  CBG (last 3)  Recent  Labs    10/14/23 0736 10/14/23 1151 10/14/23 1252  GLUCAP 135* 141* 153*   Hypothyroidism  - follow up TSH - resumed home levothyroxine     Hypomagnesemia - IV replacement given  Hypokalemia - IV replacement given - recheck in AM   Uncontrolled hypertension  - IV hydralazine  ordered - hold home ARB due to AKI    Nausea and vomiting  - reports food intolerance  - GI consult to consider for endoscopy - IV nausea medication as needed - pantoprazole  ordered - follow up urinalysis    GERD - pantoprazole  40 mg daily ordered for GI protection    AKI  - improving with IV fluid  - prerenal from poor oral intake - IV fluid hydration ordered - recheck BMP in AM  - temporarily holding ARB    Depression  - resumed home medication    Adult Failure to thrive  - consult dietitian    Severe protein calorie malnutrition  - consult to dietitian    Normocytic anemia  - check full  Anemia panel    Hyperlipidemia - resume home lipid lowering drug when reconciled home meds from pharmacy  DVT prophylaxis: [place] heparin  Code Status: full  Family Communication:  Disposition: anticipate home in 1-2 days   Consultants:  GI  Procedures:  EGD 10/14/23 w Dr. Cinderella  Antimicrobials:    Subjective: Pt agreeable to upper endoscopy today, no emesis, occasional abd pain.   Objective: Vitals:   10/14/23 1226 10/14/23 1230 10/14/23 1239 10/14/23 1245  BP: 117/60 (!) 111/45  111/80  Pulse: (!) 53 (!) 55 ROLLEN)  55 60  Resp: 18 17 16 19   Temp: 97.9 F (36.6 C)     TempSrc:      SpO2: 98% 98% 98% 98%  Weight:      Height:        Intake/Output Summary (Last 24 hours) at 10/14/2023 1313 Last data filed at 10/14/2023 1221 Gross per 24 hour  Intake 3063.89 ml  Output 1600 ml  Net 1463.89 ml   Filed Weights   10/12/23 1946 10/14/23 0302 10/14/23 1139  Weight: 60.8 kg 63.4 kg 63.4 kg   Examination:  General exam: Appears calm and comfortable  Respiratory system: Clear to  auscultation. Respiratory effort normal. Cardiovascular system: normal S1 & S2 heard. No JVD, murmurs, rubs, gallops or clicks. No pedal edema. Gastrointestinal system: Abdomen is nondistended, soft and nontender. No organomegaly or masses felt. Normal bowel sounds heard. Central nervous system: Alert and oriented. No focal neurological deficits. Extremities: Symmetric 5 x 5 power. Skin: No rashes, lesions or ulcers. Psychiatry: Judgement and insight appear normal. Mood & affect appropriate.   Data Reviewed: I have personally reviewed following labs and imaging studies  CBC: Recent Labs  Lab 10/12/23 2010 10/14/23 0413  WBC 9.4 8.2  HGB 10.5* 9.1*  HCT 32.5* 28.2*  MCV 89.8 89.0  PLT 346 281    Basic Metabolic Panel: Recent Labs  Lab 10/12/23 2010 10/14/23 0413  NA 130* 135  K 3.8 3.1*  CL 93* 102  CO2 25 23  GLUCOSE 277* 143*  BUN 29* 22  CREATININE 2.39* 1.92*  CALCIUM 8.8* 8.5*  MG  --  1.2*    CBG: Recent Labs  Lab 10/13/23 2029 10/14/23 0303 10/14/23 0736 10/14/23 1151 10/14/23 1252  GLUCAP 189* 121* 135* 141* 153*    No results found for this or any previous visit (from the past 240 hours).   Radiology Studies: CT ABDOMEN PELVIS WO CONTRAST Result Date: 10/13/2023 CLINICAL DATA:  Acute, nonlocalized abdominal pain. Abdominal pain for 3 months. EXAM: CT ABDOMEN AND PELVIS WITHOUT CONTRAST TECHNIQUE: Multidetector CT imaging of the abdomen and pelvis was performed following the standard protocol without IV contrast. RADIATION DOSE REDUCTION: This exam was performed according to the departmental dose-optimization program which includes automated exposure control, adjustment of the mA and/or kV according to patient size and/or use of iterative reconstruction technique. COMPARISON:  None Available. FINDINGS: Lower chest: Breast implant on the right with signs of intracapsular rupture on a chronic basis. Extensive atheromatous calcification of the aorta and  coronaries. Small sliding hiatal hernia. No acute finding Hepatobiliary: No focal liver abnormality.No evidence of biliary obstruction or stone. Pancreas: Unremarkable. Spleen: Unremarkable. Adrenals/Urinary Tract: Thickening of the right adrenal gland to 15 mm with low densitometry consistent with adenoma. No hydronephrosis or stone. Unremarkable bladder. Stomach/Bowel: No obstruction or abnormal stool retention. No appendicitis. Vascular/Lymphatic: No acute vascular abnormality. Extensive atheromatous calcification of the aorta and branch vessels. No mass or adenopathy. Reproductive:Hysterectomy. Other: No ascites or pneumoperitoneum. Musculoskeletal: No acute abnormalities. Generalized spinal degeneration with lumbar dextroscoliosis. IMPRESSION: 1. No acute finding or specific cause for symptoms. 2. Extensive atherosclerosis. Electronically Signed   By: Dorn Roulette M.D.   On: 10/13/2023 07:26    Scheduled Meds:  amLODipine   5 mg Oral Daily   aspirin   81 mg Oral Daily   escitalopram   10 mg Oral Daily   fesoterodine   8 mg Oral Daily   heparin   5,000 Units Subcutaneous Q8H   insulin  aspart  0-9 Units Subcutaneous TID WC  levothyroxine   100 mcg Oral Q0600   pantoprazole   40 mg Oral Q0600   simvastatin   40 mg Oral q1800   Continuous Infusions:   LOS: 1 day   Time spent: 52 mins  Sohana Austell Vicci, MD How to contact the [facility] Attending or Consulting provider 7A - 7P or covering provider during after hours 7P -7A, for this patient?  Check the care team in [facility] and look for a) attending/consulting TRH provider listed and b) the TRH team listed Log into [email or web address] to find provider on call.  Locate the TRH provider you are looking for under [facility] and page to a number that you can be directly reached. If you still have difficulty reaching the provider, please page the [facility] (Director on Call) for the Hospitalists listed on amion for assistance.  10/14/2023, 1:13 PM

## 2023-10-14 NOTE — Progress Notes (Signed)
 Mobility Specialist Progress Note:    10/14/23 1502  Mobility  Activity Ambulated with assistance to bathroom;Ambulated with assistance in hallway  Level of Assistance Standby assist, set-up cues, supervision of patient - no hands on  Assistive Device None  Distance Ambulated (ft) 30 ft  Range of Motion/Exercises Active;All extremities  Activity Response Tolerated well  Mobility Referral Yes  Mobility visit 1 Mobility  Mobility Specialist Start Time (ACUTE ONLY) 1440  Mobility Specialist Stop Time (ACUTE ONLY) 1500  Mobility Specialist Time Calculation (min) (ACUTE ONLY) 20 min   Pt received in room, requesting assistance to bathroom. Required SBA to stand and and ambulate with no AD. Tolerated well, asx throughout. Returned to room, left pt sitting EOB. All needs met.   Sherrilee Ditty Mobility Specialist Please contact via Special Educational Needs Teacher or  Rehab office at [phone]

## 2023-10-14 NOTE — Interval H&P Note (Signed)
 History and Physical Interval Note:  10/14/2023 11:58 AM  [name]  has presented today for surgery, with the diagnosis of abdominal pain, weight loss.  The various methods of treatment have been discussed with the patient and family. After consideration of risks, benefits and other options for treatment, the patient has consented to  Procedure(s): ESOPHAGOGASTRODUODENOSCOPY (EGD) WITH PROPOFOL  (N/A) as a surgical intervention.  The patient's history has been reviewed, patient examined, no change in status, stable for surgery.  I have reviewed the patient's chart and labs.  Questions were answered to the patient's satisfaction.    We will proceed with EGD as scheduled.    I thoroughly discussed with the patient the procedure, including the risks involved. Patient understands what the procedure involves including the benefits and any risks. Patient understands alternatives to the proposed procedure. Risks including (but not limited to) bleeding, tearing of the lining (perforation), rupture of adjacent organs, problems with heart and lung function, infection, and medication reactions. A small percentage of complications may require surgery, hospitalization, repeat endoscopic procedure, and/or transfusion.  Patient understood and agreed.     [name] Kayvon Mo

## 2023-10-14 NOTE — Progress Notes (Signed)
 Patient rested through the night, presenting with no complaints of nausea or vomiting. Patient up in room and washed up unassisted before bed. Patient steady with gait, and alert and oriented.

## 2023-10-14 NOTE — Plan of Care (Signed)

## 2023-10-14 NOTE — Op Note (Signed)
 [facility] Patient Name: [name] Procedure Date: 10/14/2023 12:01 PM MRN: [id number] Date of Birth: 1946-04-13 Attending MD: Deatrice Dine , MD, [id number] CSN: [id number] Age: 78 Admit Type: Inpatient Procedure:                Upper GI endoscopy Indications:              Generalized abdominal pain, Anorexia, Weight loss Providers:                Deatrice Dine, MD, Harlene Lips, Leandrew Edelman                            RN, RN, Dorcas Lenis, Technician Referring MD:              Medicines:                Monitored Anesthesia Care Complications:            No immediate complications. Estimated Blood Loss:     Estimated blood loss: none. Procedure:                Pre-Anesthesia Assessment:                           - Prior to the procedure, a History and Physical                            was performed, and patient medications and                            allergies were reviewed. The patient's tolerance of                            previous anesthesia was also reviewed. The risks                            and benefits of the procedure and the sedation                            options and risks were discussed with the patient.                            All questions were answered, and informed consent                            was obtained. Prior Anticoagulants: The patient has                            taken no anticoagulant or antiplatelet agents. ASA                            Grade Assessment: II - A patient with mild systemic                            disease. After reviewing the risks and benefits,  the patient was deemed in satisfactory condition to                            undergo the procedure.                           After obtaining informed consent, the endoscope was                            passed under direct vision. Throughout the                            procedure, the patient's blood pressure, pulse, and                             oxygen  saturations were monitored continuously. The                            [id number]-H190 ([id number]) scope was introduced through the                            mouth, and advanced to the second part of duodenum.                            The upper GI endoscopy was accomplished without                            difficulty. The patient tolerated the procedure                            well. Scope In: 12:13:14 PM Scope Out: 12:20:53 PM Total Procedure Duration: 0 hours 7 minutes 39 seconds  Findings:      Esophagogastric landmarks were identified: the Z-line was found at 34 cm       and the gastroesophageal junction was found at 39 cm from the incisors.      A 5 cm hiatal hernia was present.      Mucosal changes characterized by congestion and altered texture were       found in the cardia.      Mildly erythematous mucosa without bleeding was found in the gastric       antrum. Biopsies were taken with a cold forceps for histology.      The duodenal bulb and second portion of the duodenum were normal. Impression:               - Esophagogastric landmarks identified.                           - 5 cm hiatal hernia.                           - Congested and texture changed mucosa in the                            cardia.This was not biopsied as isolated gastric  varices could not be excluded                           - Erythematous mucosa in the antrum. Biopsied.                           - Normal duodenal bulb and second portion of the                            duodenum. Moderate Sedation:      Per Anesthesia Care Recommendation:           - Patient has a contact number available for                            emergencies. The signs and symptoms of potential                            delayed complications were discussed with the                            patient. Return to normal activities tomorrow.                            Written discharge  instructions were provided to the                            patient.                           - Resume previous diet.                           - Continue present medications.                           - Await pathology results.                           -Patient to follow up with Primary GI Dr Rosalie                           -May consider CT angio abdomen and pelvis with IV                            contrast if symptoms persist to assess for chronic                            mesentric ischemia given extensive atherosclerosis                           -Possible outpatient colonoscopy with Dr. Rosalie                           -Consider EUS /Biopsy of edematous cardiac fold Procedure Code(s):        --- Professional ---  56760, Esophagogastroduodenoscopy, flexible,                            transoral; with biopsy, single or multiple Diagnosis Code(s):        --- Professional ---                           K44.9, Diaphragmatic hernia without obstruction or                            gangrene                           K31.89, Other diseases of stomach and duodenum                           R10.84, Generalized abdominal pain                           R63.0, Anorexia                           R63.4, Abnormal weight loss CPT copyright 2022 American Medical Association. All rights reserved. The codes documented in this report are preliminary and upon coder review may  be revised to meet current compliance requirements. Deatrice Dine, MD Deatrice Dine, MD 10/14/2023 12:42:20 PM This report has been signed electronically. Number of Addenda: 0

## 2023-10-15 ENCOUNTER — Inpatient Hospital Stay (HOSPITAL_COMMUNITY): Payer: Medicare Other

## 2023-10-15 DIAGNOSIS — R634 Abnormal weight loss: Secondary | ICD-10-CM | POA: Diagnosis not present

## 2023-10-15 DIAGNOSIS — R1084 Generalized abdominal pain: Secondary | ICD-10-CM | POA: Diagnosis not present

## 2023-10-15 DIAGNOSIS — N179 Acute kidney failure, unspecified: Secondary | ICD-10-CM | POA: Diagnosis not present

## 2023-10-15 DIAGNOSIS — E039 Hypothyroidism, unspecified: Secondary | ICD-10-CM | POA: Diagnosis not present

## 2023-10-15 LAB — GLUCOSE, CAPILLARY
Glucose-Capillary: 133 mg/dL — ABNORMAL HIGH (ref 70–99)
Glucose-Capillary: 138 mg/dL — ABNORMAL HIGH (ref 70–99)
Glucose-Capillary: 163 mg/dL — ABNORMAL HIGH (ref 70–99)

## 2023-10-15 LAB — CBC
HCT: 27.1 % — ABNORMAL LOW (ref 36.0–46.0)
Hemoglobin: 9.1 g/dL — ABNORMAL LOW (ref 12.0–15.0)
MCH: 29.8 pg (ref 26.0–34.0)
MCHC: 33.6 g/dL (ref 30.0–36.0)
MCV: 88.9 fL (ref 80.0–100.0)
Platelets: 288 10*3/uL (ref 150–400)
RBC: 3.05 MIL/uL — ABNORMAL LOW (ref 3.87–5.11)
RDW: 14.6 % (ref 11.5–15.5)
WBC: 7.6 10*3/uL (ref 4.0–10.5)
nRBC: 0 % (ref 0.0–0.2)

## 2023-10-15 LAB — COMPREHENSIVE METABOLIC PANEL
ALT: 10 U/L (ref 0–44)
AST: 12 U/L — ABNORMAL LOW (ref 15–41)
Albumin: 3.1 g/dL — ABNORMAL LOW (ref 3.5–5.0)
Alkaline Phosphatase: 27 U/L — ABNORMAL LOW (ref 38–126)
Anion gap: 7 (ref 5–15)
BUN: 20 mg/dL (ref 8–23)
CO2: 25 mmol/L (ref 22–32)
Calcium: 8.6 mg/dL — ABNORMAL LOW (ref 8.9–10.3)
Chloride: 103 mmol/L (ref 98–111)
Creatinine, Ser: 2.03 mg/dL — ABNORMAL HIGH (ref 0.44–1.00)
GFR, Estimated: 25 mL/min — ABNORMAL LOW (ref >60)
Glucose, Bld: 146 mg/dL — ABNORMAL HIGH (ref 70–99)
Potassium: 3.5 mmol/L (ref 3.5–5.1)
Sodium: 135 mmol/L (ref 135–145)
Total Bilirubin: 0.7 mg/dL (ref 0.0–1.2)
Total Protein: 5.4 g/dL — ABNORMAL LOW (ref 6.5–8.1)

## 2023-10-15 LAB — SURGICAL PATHOLOGY

## 2023-10-15 LAB — MAGNESIUM: Magnesium: 2.1 mg/dL (ref 1.7–2.4)

## 2023-10-15 MED ORDER — ASPIRIN 81 MG PO CHEW: 81.0000 mg | CHEWABLE_TABLET | Freq: Every day | ORAL | Status: AC

## 2023-10-15 MED ORDER — PANTOPRAZOLE SODIUM 40 MG PO TBEC: 40.0000 mg | DELAYED_RELEASE_TABLET | Freq: Every day | ORAL | 1 refills | Status: DC

## 2023-10-15 NOTE — Discharge Instructions (Signed)
 IMPORTANT INFORMATION: PAY CLOSE ATTENTION  ? ?PHYSICIAN DISCHARGE INSTRUCTIONS ? ?Follow with Primary care provider  Assunta Found, MD  and other consultants as instructed by your Hospitalist Physician ? ?SEEK MEDICAL CARE OR RETURN TO EMERGENCY ROOM IF SYMPTOMS COME BACK, WORSEN OR NEW PROBLEM DEVELOPS  ? ?Please note: ?You were cared for by a hospitalist during your hospital stay. Every effort will be made to forward records to your primary care provider.  You can request that your primary care provider send for your hospital records if they have not received them.  Once you are discharged, your primary care physician will handle any further medical issues. Please note that NO REFILLS for any discharge medications will be authorized once you are discharged, as it is imperative that you return to your primary care physician (or establish a relationship with a primary care physician if you do not have one) for your post hospital discharge needs so that they can reassess your need for medications and monitor your lab values. ? ?Please get a complete blood count and chemistry panel checked by your Primary MD at your next visit, and again as instructed by your Primary MD. ? ?Get Medicines reviewed and adjusted: ?Please take all your medications with you for your next visit with your Primary MD ? ?Laboratory/radiological data: ?Please request your Primary MD to go over all hospital tests and procedure/radiological results at the follow up, please ask your primary care provider to get all Hospital records sent to his/her office. ? ?In some cases, they will be blood work, cultures and biopsy results pending at the time of your discharge. Please request that your primary care provider follow up on these results. ? ?If you are diabetic, please bring your blood sugar readings with you to your follow up appointment with primary care.   ? ?Please call and make your follow up appointments as soon as possible.   ? ?Also Note  the following: ?If you experience worsening of your admission symptoms, develop shortness of breath, life threatening emergency, suicidal or homicidal thoughts you must seek medical attention immediately by calling 911 or calling your MD immediately  if symptoms less severe. ? ?You must read complete instructions/literature along with all the possible adverse reactions/side effects for all the Medicines you take and that have been prescribed to you. Take any new Medicines after you have completely understood and accpet all the possible adverse reactions/side effects.  ? ?Do not drive when taking Pain medications or sleeping medications (Benzodiazepines) ? ?Do not take more than prescribed Pain, Sleep and Anxiety Medications. It is not advisable to combine anxiety,sleep and pain medications without talking with your primary care practitioner ? ?Special Instructions: If you have smoked or chewed Tobacco  in the last 2 yrs please stop smoking, stop any regular Alcohol  and or any Recreational drug use. ? ?Wear Seat belts while driving.  Do not drive if taking any narcotic, mind altering or controlled substances or recreational drugs or alcohol.  ? ? ? ? ? ?

## 2023-10-15 NOTE — Anesthesia Postprocedure Evaluation (Signed)
 Anesthesia Post Note  Patient: [name]  Procedure(s) Performed: ESOPHAGOGASTRODUODENOSCOPY (EGD) WITH PROPOFOL  BIOPSY  Patient location during evaluation: Phase II Anesthesia Type: General Level of consciousness: awake Pain management: pain level controlled Vital Signs Assessment: post-procedure vital signs reviewed and stable Respiratory status: spontaneous breathing and respiratory function stable Cardiovascular status: blood pressure returned to baseline and stable Postop Assessment: no headache and no apparent nausea or vomiting Anesthetic complications: no Comments: Late entry   No notable events documented.   Last Vitals:  Vitals:   10/14/23 2033 10/15/23 0257  BP: 133/60 (!) 158/53  Pulse: 71 60  Resp:  16  Temp: 36.5 C 36.9 C  SpO2: 95% 96%    Last Pain:  Vitals:   10/14/23 2033  TempSrc: Oral  PainSc: 2                  Yvonna JINNY Bosworth

## 2023-10-15 NOTE — Plan of Care (Signed)
   Problem: Health Behavior/Discharge Planning: Goal: Ability to manage health-related needs will improve Outcome: Progressing

## 2023-10-15 NOTE — Consult Note (Signed)
 [facility] Liaison Note  10/15/2023  [name] 1946/06/03 [id number]  Location: RN Hospital Liaison screened the patient remotely at [facility].  Insurance: Medicare   [name] is a 78 y.o. female who is a Primary Care Patient of Marvine Rush, MD The patient was screened for  readmission hospitalization with noted low risk score for unplanned readmission risk with 1 IP/1 ED in 6 months.  The patient was assessed for potential Care Management service needs for post hospital transition for care coordination. Review of patient's electronic medical record reveals patient was admitted for AKI. Provider currently not participating with [facility] for care management services.   [facility] Care Management/Population Health does not replace or interfere with any arrangements made by the Inpatient Transition of Care team.   For questions contact:   Olam Ku, RN, [facility] Liaison [place]   [facility], Population Health Office Hours MTWF  8:00 am-6:00 pm Direct Dial: [phone] mobile [phone] [Office toll free line] Office Hours are M-F 8:30 - 5 pm [email or web address]

## 2023-10-15 NOTE — Discharge Summary (Addendum)
 Physician Discharge Summary  [name] FMW:[id number] DOB: December 04, 1945 DOA: 10/12/2023  PCP: Marvine Rush, MD GI: [facility]--->transferring to [facility] per patient preference  Admit date: 10/12/2023 Discharge date: 10/15/2023  Admitted From:  Home  Disposition: Home   Recommendations for Outpatient Follow-up:  Follow up with PCP in 1 weeks Follow up with [facility] in 2-3 weeks  Please obtain BMP/CBC in 1-2 weeks Establish care with Dr. Rachele to follow up CKD Please follow up on the following pending results: renal US   Discharge Condition: STABLE   CODE STATUS: FULL DIET: heart healthy soft foods recommended  Brief Hospitalization Summary: Please see all hospital notes, images, labs for full details of the hospitalization. Admission provider HPI:  78 year old female with type 2 diabetes mellitus, hypertension, hypothyroidism, hyperlipidemia, diverticulosis, GERD, history of adenomatous colon polyps, depression was brought into the emergency department with daughter complaining of 3 months of intermittent abdominal pain and reportedly vomiting after she eats meals.  She has lost a significant amount of weight.  She reports that the pain in the abdomen is middle to lower abdomen and generalized at times.  She reports that symptoms initially started off several months ago with diarrhea which has resolved.  She reports losing about 15 pounds unintentional weight loss.  She reports vomiting after eating.  She denies fever and chills.  In the ED she was sent for CT scan but it did not show any acute findings.  She did have some abnormal labs including sodium of 130 and creatinine of 2.39 consistent with an AKI.  She was noted to have a normocytic anemia.  Admission was requested for further workup of her idiopathic abdominal pain and treatment of AKI.  HOSPITAL COURSE BY PROBLEM LIST   Generalized abdominal pain - cause undetermined - CT abdomen/pelvis unrevealing - associated with poor  oral intake and unintentional weight loss - GI consultation requested to assist with diagnosis/workup - dys 3 diet ordered 1/2 after EGD - further recommendations to follow - GI team considering CTA abdomen to eval for chronic mesenteric ischemia   EGD Findings:  - 5 cm hiatal hernia.  - Congested and texture changed mucosa in the cardia. This was not biopsied as isolated gastric varices could not be excluded  - Erythematous mucosa in the antrum. Biopsied.  - Normal duodenal bulb and second portion of the duodenum.  Abnormal weight loss - cause unknown but suspect chronic poor oral intake - further work up to follow - GI consultation appreciated; EGD planned for 1/2 - check TSH--1.554    Type 2 diabetes mellitus with unspecified complications - check A1c--7.1%  - SSI coverage as ordered - CBG monitoring  CBG (last 3)  Recent Labs (last 2 labs)       Recent Labs    10/14/23 0736 10/14/23 1151 10/14/23 1252  GLUCAP 135* 141* 153*      Hypothyroidism  - follow up TSH - resumed home levothyroxine     Hypomagnesemia - IV replacement given   Hypokalemia - IV replacement given - recheck in AM    Uncontrolled hypertension  - IV hydralazine  ordered - hold home ARB due to AKI  - resumed at discharge    Nausea and vomiting  - reports food intolerance  - GI consult to consider for endoscopy - IV nausea medication as needed - pantoprazole  ordered   GERD - pantoprazole  40 mg daily ordered for GI protection    AKI  - improving with IV fluid  - prerenal from poor  oral intake - IV fluid hydration ordered - recheck BMP in AM  - temporarily holding ARB, resume at DC - renal  US  pending - suspect underlying CKD - GFR 19 - amb referral to Dr. Rachele to follow up     Depression  - resumed home medication    Adult Failure to thrive  - consult dietitian    Severe protein calorie malnutrition  - consult to dietitian    Normocytic anemia  - check full  Anemia panel     Hyperlipidemia - resume home lipid lowering medication  Discharge Diagnoses:  Principal Problem:   AKI (acute kidney injury) (HCC) Active Problems:   Uncontrolled hypertension   Abnormal weight loss   Food intolerance in adult   FTT (failure to thrive) in adult   Generalized abdominal pain   Diabetes (HCC)   Diverticulosis   Gastroesophageal reflux disease with esophagitis   History of adenomatous polyp of colon   Hyperlipidemia   Hypothyroidism   Uncontrolled type 2 diabetes mellitus with hyperglycemia (HCC)   Depression   Normocytic anemia   Malnutrition of moderate degree   Gastritis and gastroduodenitis   Discharge Instructions: Discharge Instructions     Ambulatory referral to Gastroenterology   Complete by: As directed    Hospital Follow up 2-3 weeks   What is the reason for referral?: Other   Ambulatory referral to Nephrology   Complete by: As directed       Allergies as of 10/15/2023   No Known Allergies      Medication List     STOP taking these medications    aspirin  325 MG tablet Replaced by: aspirin  81 MG chewable tablet   hydrochlorothiazide  25 MG tablet Commonly known as: HYDRODIURIL        TAKE these medications    Accu-Chek Aviva Plus test strip Generic drug: glucose blood 1 each by Other route 4 (four) times daily.   Accu-Chek FastClix Lancets Misc U TO TEST QID   amLODipine  5 MG tablet Commonly known as: NORVASC  Take 5 mg by mouth daily.   aspirin  81 MG chewable tablet Chew 1 tablet (81 mg total) by mouth daily. Start taking on: October 16, 2023 Replaces: aspirin  325 MG tablet   escitalopram  10 MG tablet Commonly known as: LEXAPRO  Take 10 mg by mouth daily.   fesoterodine  8 MG Tb24 tablet Commonly known as: Toviaz  Take 1 tablet (8 mg total) by mouth daily.   Janumet  50-1000 MG tablet Generic drug: sitaGLIPtin -metformin  Take 1 tablet by mouth 2 (two) times daily.   levothyroxine  100 MCG tablet Commonly known as:  SYNTHROID  Take 100 mcg by mouth daily.   olmesartan  40 MG tablet Commonly known as: BENICAR  Take 40 mg by mouth daily.   ondansetron  4 MG tablet Commonly known as: ZOFRAN  Take 1 tablet (4 mg total) by mouth every 6 (six) hours.   pantoprazole  40 MG tablet Commonly known as: PROTONIX  Take 1 tablet (40 mg total) by mouth daily at 6 (six) AM. Start taking on: October 16, 2023   PreviDent 5000 Dry Mouth 1.1 % Gel dental gel Generic drug: sodium fluoride  Place 1 Application onto teeth at bedtime.   simvastatin  40 MG tablet Commonly known as: ZOCOR  Take 40 mg by mouth daily.        Follow-up Information     Marvine Rush, MD. Schedule an appointment as soon as possible for a visit in 1 week(s).   Specialty: Family Medicine Why: Hospital Follow Up Contact information: [address]  [address] [place] [address] [phone]         [facility]. Schedule an appointment as soon as possible for a visit in 2 week(s).   Why: Hospital Follow Up Contact information: [address] [place]  [phone]               No Known Allergies Allergies as of 10/15/2023   No Known Allergies      Medication List     STOP taking these medications    aspirin  325 MG tablet Replaced by: aspirin  81 MG chewable tablet   hydrochlorothiazide  25 MG tablet Commonly known as: HYDRODIURIL        TAKE these medications    Accu-Chek Aviva Plus test strip Generic drug: glucose blood 1 each by Other route 4 (four) times daily.   Accu-Chek FastClix Lancets Misc U TO TEST QID   amLODipine  5 MG tablet Commonly known as: NORVASC  Take 5 mg by mouth daily.   aspirin  81 MG chewable tablet Chew 1 tablet (81 mg total) by mouth daily. Start taking on: October 16, 2023 Replaces: aspirin  325 MG tablet   escitalopram  10 MG tablet Commonly known as: LEXAPRO  Take 10 mg by mouth daily.   fesoterodine  8 MG Tb24 tablet Commonly  known as: Toviaz  Take 1 tablet (8 mg total) by mouth daily.   Janumet  50-1000 MG tablet Generic drug: sitaGLIPtin -metformin  Take 1 tablet by mouth 2 (two) times daily.   levothyroxine  100 MCG tablet Commonly known as: SYNTHROID  Take 100 mcg by mouth daily.   olmesartan  40 MG tablet Commonly known as: BENICAR  Take 40 mg by mouth daily.   ondansetron  4 MG tablet Commonly known as: ZOFRAN  Take 1 tablet (4 mg total) by mouth every 6 (six) hours.   pantoprazole  40 MG tablet Commonly known as: PROTONIX  Take 1 tablet (40 mg total) by mouth daily at 6 (six) AM. Start taking on: October 16, 2023   PreviDent 5000 Dry Mouth 1.1 % Gel dental gel Generic drug: sodium fluoride  Place 1 Application onto teeth at bedtime.   simvastatin  40 MG tablet Commonly known as: ZOCOR  Take 40 mg by mouth daily.        Procedures/Studies: CT ABDOMEN PELVIS WO CONTRAST Result Date: 10/13/2023 CLINICAL DATA:  Acute, nonlocalized abdominal pain. Abdominal pain for 3 months. EXAM: CT ABDOMEN AND PELVIS WITHOUT CONTRAST TECHNIQUE: Multidetector CT imaging of the abdomen and pelvis was performed following the standard protocol without IV contrast. RADIATION DOSE REDUCTION: This exam was performed according to the departmental dose-optimization program which includes automated exposure control, adjustment of the mA and/or kV according to patient size and/or use of iterative reconstruction technique. COMPARISON:  None Available. FINDINGS: Lower chest: Breast implant on the right with signs of intracapsular rupture on a chronic basis. Extensive atheromatous calcification of the aorta and coronaries. Small sliding hiatal hernia. No acute finding Hepatobiliary: No focal liver abnormality.No evidence of biliary obstruction or stone. Pancreas: Unremarkable. Spleen: Unremarkable. Adrenals/Urinary Tract: Thickening of the right adrenal gland to 15 mm with low densitometry consistent with adenoma. No hydronephrosis or stone.  Unremarkable bladder. Stomach/Bowel: No obstruction or abnormal stool retention. No appendicitis. Vascular/Lymphatic: No acute vascular abnormality. Extensive atheromatous calcification of the aorta and branch vessels. No mass or adenopathy. Reproductive:Hysterectomy. Other: No ascites or pneumoperitoneum. Musculoskeletal: No acute abnormalities. Generalized spinal degeneration with lumbar dextroscoliosis. IMPRESSION: 1. No acute finding or specific cause for symptoms. 2. Extensive atherosclerosis. Electronically Signed   By: Dorn Roulette M.D.   On: 10/13/2023  07:26     Subjective: Pt reports that she is feeling better, she has been eating and tolerating the diet with no abdominal pain and no emesis.  She is reporting she prefers to follow up with [facility] due to being much closer to where she lives.    Discharge Exam: Vitals:   10/14/23 2033 10/15/23 0257  BP: 133/60 (!) 158/53  Pulse: 71 60  Resp:  16  Temp: 97.7 F (36.5 C) 98.4 F (36.9 C)  SpO2: 95% 96%   Vitals:   10/14/23 1718 10/14/23 2033 10/15/23 0257 10/15/23 0300  BP: (!) 162/59 133/60 (!) 158/53   Pulse: 61 71 60   Resp:   16   Temp:  97.7 F (36.5 C) 98.4 F (36.9 C)   TempSrc:  Oral    SpO2: 98% 95% 96%   Weight:    62.6 kg  Height:       General: Pt is alert, awake, not in acute distress Cardiovascular: normal S1/S2 +, no rubs, no gallops Respiratory: CTA bilaterally, no wheezing, no rhonchi Abdominal: Soft, NT, ND, bowel sounds + Extremities: no edema, no cyanosis   The results of significant diagnostics from this hospitalization (including imaging, microbiology, ancillary and laboratory) are listed below for reference.     Microbiology: No results found for this or any previous visit (from the past 240 hours).   Labs: BNP (last 3 results) No results for input(s): BNP in the last 8760 hours. Basic Metabolic Panel: Recent Labs  Lab 10/12/23 2010 10/14/23 0413 10/15/23 0332  NA 130* 135 135   K 3.8 3.1* 3.5  CL 93* 102 103  CO2 25 23 25   GLUCOSE 277* 143* 146*  BUN 29* 22 20  CREATININE 2.39* 1.92* 2.03*  CALCIUM 8.8* 8.5* 8.6*  MG  --  1.2* 2.1   Liver Function Tests: Recent Labs  Lab 10/12/23 2010 10/14/23 0413 10/15/23 0332  AST 17 13* 12*  ALT 13 12 10   ALKPHOS 33* 27* 27*  BILITOT 0.6 0.7 0.7  PROT 6.3* 5.5* 5.4*  ALBUMIN 3.7 3.2* 3.1*   Recent Labs  Lab 10/12/23 2010  LIPASE 35   No results for input(s): AMMONIA in the last 168 hours. CBC: Recent Labs  Lab 10/12/23 2010 10/14/23 0413 10/15/23 0332  WBC 9.4 8.2 7.6  HGB 10.5* 9.1* 9.1*  HCT 32.5* 28.2* 27.1*  MCV 89.8 89.0 88.9  PLT 346 281 288   Cardiac Enzymes: No results for input(s): CKTOTAL, CKMB, CKMBINDEX, TROPONINI in the last 168 hours. BNP: Invalid input(s): POCBNP CBG: Recent Labs  Lab 10/14/23 1632 10/14/23 2102 10/15/23 0258 10/15/23 0729 10/15/23 1105  GLUCAP 235* 165* 133* 138* 163*   D-Dimer No results for input(s): DDIMER in the last 72 hours. Hgb A1c Recent Labs    10/12/23 2017  HGBA1C 7.1*   Lipid Profile No results for input(s): CHOL, HDL, LDLCALC, TRIG, CHOLHDL, LDLDIRECT in the last 72 hours. Thyroid  function studies Recent Labs    10/12/23 2010  TSH 1.554   Anemia work up Recent Labs    10/13/23 1344  VITAMINB12 267  FOLATE 21.8  FERRITIN 17  TIBC 399  IRON 58  RETICCTPCT 0.8   Urinalysis    Component Value Date/Time   COLORURINE COLORLESS (A) 10/13/2023 1140   APPEARANCEUR CLEAR 10/13/2023 1140   APPEARANCEUR Hazy (A) 11/25/2022 1505   LABSPEC 1.004 (L) 10/13/2023 1140   PHURINE 7.0 10/13/2023 1140   GLUCOSEU NEGATIVE 10/13/2023 1140   HGBUR NEGATIVE  10/13/2023 1140   BILIRUBINUR NEGATIVE 10/13/2023 1140   BILIRUBINUR Negative 11/25/2022 1505   KETONESUR NEGATIVE 10/13/2023 1140   PROTEINUR NEGATIVE 10/13/2023 1140   NITRITE NEGATIVE 10/13/2023 1140   LEUKOCYTESUR LARGE (A) 10/13/2023 1140   Sepsis  Labs Recent Labs  Lab 10/12/23 2010 10/14/23 0413 10/15/23 0332  WBC 9.4 8.2 7.6   Microbiology No results found for this or any previous visit (from the past 240 hours).  Time coordinating discharge: 40 mins  SIGNED:  Afton Louder, MD  [facility] 10/15/2023, 12:35 PM How to contact the [facility] Attending or Consulting provider 7A - 7P or covering provider during after hours 7P -7A, for this patient?  Check the care team in [facility] and look for a) attending/consulting [facility] provider listed and b) the [facility] team listed Log into [email or web address] and use [place]'s universal password to access. If you do not have the password, please contact the hospital operator. Locate the [facility] provider you are looking for under [facility] and page to a number that you can be directly reached. If you still have difficulty reaching the provider, please page the [facility] (Director on Call) for the Hospitalists listed on amion for assistance.

## 2023-10-18 ENCOUNTER — Encounter (INDEPENDENT_AMBULATORY_CARE_PROVIDER_SITE_OTHER): Payer: Self-pay | Admitting: *Deleted

## 2023-10-18 NOTE — Progress Notes (Signed)
 I reviewed the pathology results. Ann, can you send her a letter with the findings as described below please?  Thanks,  Mantaj Chamberlin Faizan Emma Terry, MD Gastroenterology and Hepatology [facility]  ---------------------------------------------------------------------------------------------  [facility] [address], [place], [place] [address] Phone:  [phone]   10/18/23 [place], [place]   Dear [name],  I am writing to inform you that the biopsies taken during your recent endoscopic examination showed:  No H. Pylori bacteria in stomach , or any early cancer changes to the stomach mucosa ( Intestinal metaplasia)   I recommend that you follow up with your primary Gastroenterologist (Dr Rosalie) with the reports of the upper endoscopy recently performed as further workup may be indicated:  - May consider CT angio abdomen and pelvis with IV contrast if symptoms persist to assess for chronic mesentric ischemia given extensive atherosclerosis  - Possible outpatient colonoscopy with Dr. Rosalie  - Consider EUS / Biopsy of edematous cardiac fold  Also I value your feedback , so if you get a survey , please take the time to fill it out and thank you for choosing [facility]  Please call us  at [phone] if you have persistent problems or have questions about your condition that have not been fully answered at this time.  Sincerely,  Danine Hor Faizan Riva Sesma, MD Gastroenterology and Hepatology

## 2023-10-22 ENCOUNTER — Encounter (HOSPITAL_COMMUNITY): Payer: Self-pay | Admitting: Gastroenterology

## 2023-10-22 DIAGNOSIS — D649 Anemia, unspecified: Secondary | ICD-10-CM | POA: Diagnosis not present

## 2023-10-22 DIAGNOSIS — Z6824 Body mass index (BMI) 24.0-24.9, adult: Secondary | ICD-10-CM | POA: Diagnosis not present

## 2023-10-22 DIAGNOSIS — E1122 Type 2 diabetes mellitus with diabetic chronic kidney disease: Secondary | ICD-10-CM | POA: Diagnosis not present

## 2023-10-22 DIAGNOSIS — A084 Viral intestinal infection, unspecified: Secondary | ICD-10-CM | POA: Diagnosis not present

## 2023-10-28 DIAGNOSIS — N1832 Chronic kidney disease, stage 3b: Secondary | ICD-10-CM | POA: Diagnosis not present

## 2023-10-28 DIAGNOSIS — R8290 Unspecified abnormal findings in urine: Secondary | ICD-10-CM | POA: Diagnosis not present

## 2023-10-28 DIAGNOSIS — E1122 Type 2 diabetes mellitus with diabetic chronic kidney disease: Secondary | ICD-10-CM | POA: Diagnosis not present

## 2023-10-28 DIAGNOSIS — N189 Chronic kidney disease, unspecified: Secondary | ICD-10-CM | POA: Diagnosis not present

## 2023-10-28 DIAGNOSIS — R634 Abnormal weight loss: Secondary | ICD-10-CM | POA: Diagnosis not present

## 2023-10-28 DIAGNOSIS — N179 Acute kidney failure, unspecified: Secondary | ICD-10-CM | POA: Diagnosis not present

## 2023-10-29 DIAGNOSIS — E1122 Type 2 diabetes mellitus with diabetic chronic kidney disease: Secondary | ICD-10-CM | POA: Diagnosis not present

## 2023-10-29 DIAGNOSIS — N179 Acute kidney failure, unspecified: Secondary | ICD-10-CM | POA: Diagnosis not present

## 2023-10-29 DIAGNOSIS — R634 Abnormal weight loss: Secondary | ICD-10-CM | POA: Diagnosis not present

## 2023-10-29 DIAGNOSIS — N189 Chronic kidney disease, unspecified: Secondary | ICD-10-CM | POA: Diagnosis not present

## 2023-10-29 DIAGNOSIS — N1832 Chronic kidney disease, stage 3b: Secondary | ICD-10-CM | POA: Diagnosis not present

## 2023-10-29 DIAGNOSIS — R8290 Unspecified abnormal findings in urine: Secondary | ICD-10-CM | POA: Diagnosis not present

## 2023-11-16 ENCOUNTER — Encounter (INDEPENDENT_AMBULATORY_CARE_PROVIDER_SITE_OTHER): Payer: Self-pay | Admitting: Gastroenterology

## 2023-11-16 ENCOUNTER — Ambulatory Visit (INDEPENDENT_AMBULATORY_CARE_PROVIDER_SITE_OTHER): Payer: Medicare Other | Admitting: Gastroenterology

## 2023-11-16 VITALS — BP 182/70 | HR 46 | Temp 97.3°F | Ht 61.0 in | Wt 134.8 lb

## 2023-11-16 DIAGNOSIS — R634 Abnormal weight loss: Secondary | ICD-10-CM

## 2023-11-16 DIAGNOSIS — R1084 Generalized abdominal pain: Secondary | ICD-10-CM

## 2023-11-16 DIAGNOSIS — D500 Iron deficiency anemia secondary to blood loss (chronic): Secondary | ICD-10-CM

## 2023-11-16 DIAGNOSIS — I864 Gastric varices: Secondary | ICD-10-CM

## 2023-11-16 DIAGNOSIS — D509 Iron deficiency anemia, unspecified: Secondary | ICD-10-CM

## 2023-11-16 DIAGNOSIS — I10 Essential (primary) hypertension: Secondary | ICD-10-CM | POA: Insufficient documentation

## 2023-11-16 NOTE — Patient Instructions (Signed)
It was very nice to meet you today, as dicussed with will plan for the following :  1) Lab work and than CT Angio abdomen  2) Colonoscopy

## 2023-11-16 NOTE — H&P (View-Only) (Signed)
[name] , M.D. Gastroenterology & Hepatology [facility] [address] [place], [place] [address] Primary Care Physician: Assunta Found, MD [address] [place] [address]  Chief Complaint: Unintentional weight loss, iron deficiency, history of TA; surveillance colonoscopy and intermittent abdominal pain   History of Present Illness:  [name] is a 78 y.o. female with  history of diabetes, hyperlipidemia and hypertension,  who presents for evaluation of Unintentional weight loss, iron deficiency, history of TA; surveillance colonoscopy and intermittent abdominal pain  Patient was recently admitted in the hospital for poor oral intake, weight loss abdominal pain and vomiting was seen by GI for his first GI clinic appointment.  Patient previously has primary gastroenterologist Dr. Ewing Schlein and requesting to transfer care here at [facility]  Fortunately patient nausea vomiting has resolved.  She reports good appetite now but has intermittent epigastric discomfort without any aggravating or relieving factors The patient denies having any nausea, vomiting, fever, chills, hematochezia, melena, hematemesis,diarrhea, jaundice, pruritus  Patient with recorded 10/23 was 150 pounds and today is 134 pounds  Last EGD:10/14/2023 - 5 cm hiatal hernia.  - Congested and texture changed mucosa in the cardia. This was not biopsied as isolated gastric varices could not be excluded  - Erythematous mucosa in the antrum. Biopsied.  - Normal duodenal bulb and second portion of the duodenum.  Last Colonoscopy:2019 Dr. Ewing Schlein (GI) in [place], last EGD and colonoscopy in 2019. EGD was found "to have inflammation at the GE junction. 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 The only other finding was presbyesophagus".  Colonoscopy was reported to have an adenoma and was recommended to have a repeat colonoscopy in 2024.   FHx: neg for any  gastrointestinal/liver disease, no malignancies Social: neg smoking, alcohol or illicit drug use Surgical: no abdominal surgeries  Past Medical History: Past Medical History:  Diagnosis Date   Diabetes mellitus without complication (HCC)    High cholesterol    Hypertension     Past Surgical History: Past Surgical History:  Procedure Laterality Date   BIOPSY  10/14/2023   Procedure: BIOPSY;  Surgeon: Franky Macho, MD;  [facility];  Service: Endoscopy;;   ESOPHAGOGASTRODUODENOSCOPY (EGD) WITH PROPOFOL N/A 10/14/2023   Procedure: ESOPHAGOGASTRODUODENOSCOPY (EGD) WITH PROPOFOL;  Surgeon: Franky Macho, MD;  [facility];  Service: Endoscopy;  Laterality: N/A;   FOOT SURGERY      Family History:History reviewed. No pertinent family history.  Social History: Social History   Tobacco Use  Smoking Status Never  Smokeless Tobacco Never   Social History   Substance and Sexual Activity  Alcohol Use Never   Social History   Substance and Sexual Activity  Drug Use Never    Allergies: No Known Allergies  Medications: Current Outpatient Medications  Medication Sig Dispense Refill   ACCU-CHEK AVIVA PLUS test strip 1 each by Other route 4 (four) times daily.   5   ACCU-CHEK FASTCLIX LANCETS MISC U TO TEST QID  10   amLODipine (NORVASC) 5 MG tablet Take 5 mg by mouth daily.     aspirin 81 MG chewable tablet Chew 1 tablet (81 mg total) by mouth daily.     cloNIDine (CATAPRES) 0.1 MG tablet Take 0.1 mg by mouth 2 (two) times daily.     escitalopram (LEXAPRO) 10 MG tablet Take 10 mg by mouth daily.   1   fesoterodine (TOVIAZ) 8 MG TB24 tablet Take 1 tablet (8 mg  total) by mouth daily. 30 tablet 11   JANUMET 50-1000 MG tablet Take 1 tablet by mouth 2 (two) times daily.     levothyroxine (SYNTHROID, LEVOTHROID) 100 MCG tablet Take 100 mcg by mouth daily.   3   olmesartan (BENICAR) 40 MG tablet Take 40 mg by mouth daily.     pantoprazole (PROTONIX) 40 MG  tablet Take 1 tablet (40 mg total) by mouth daily at 6 (six) AM. 30 tablet 1   PREVIDENT 5000 DRY MOUTH 1.1 % GEL dental gel Place 1 Application onto teeth at bedtime.     simvastatin (ZOCOR) 40 MG tablet Take 40 mg by mouth daily.   3   ondansetron (ZOFRAN) 4 MG tablet Take 1 tablet (4 mg total) by mouth every 6 (six) hours. (Patient not taking: Reported on 11/16/2023) 12 tablet 0   No current facility-administered medications for this visit.    Review of Systems: GENERAL: negative for malaise, night sweats HEENT: No changes in hearing or vision, no nose bleeds or other nasal problems. NECK: Negative for lumps, goiter, pain and significant neck swelling RESPIRATORY: Negative for cough, wheezing CARDIOVASCULAR: Negative for chest pain, leg swelling, palpitations, orthopnea GI: SEE HPI MUSCULOSKELETAL: Negative for joint pain or swelling, back pain, and muscle pain. SKIN: Negative for lesions, rash HEMATOLOGY Negative for prolonged bleeding, bruising easily, and swollen nodes. ENDOCRINE: Negative for cold or heat intolerance, polyuria, polydipsia and goiter. NEURO: negative for tremor, gait imbalance, syncope and seizures. The remainder of the review of systems is noncontributory.   Physical Exam: BP (!) 182/70 (BP Location: Left Arm, Patient Position: Sitting, Cuff Size: Normal)   Pulse (!) 46   Temp (!) 97.3 F (36.3 C) (Temporal)   Ht 5\' 1"  (1.549 m)   Wt 134 lb 12.8 oz (61.1 kg)   BMI 25.47 kg/m  GENERAL: The patient is AO x3, in no acute distress. HEENT: Head is normocephalic and atraumatic. EOMI are intact. Mouth is well hydrated and without lesions. NECK: Supple. No masses LUNGS: Clear to auscultation. No presence of rhonchi/wheezing/rales. Adequate chest expansion HEART: RRR, normal s1 and s2. ABDOMEN: Soft, nontender, no guarding, no peritoneal signs, and nondistended. BS +. No masses.  Imaging/Labs: as above     Latest Ref Rng & Units 10/15/2023    3:32 AM 10/14/2023     4:13 AM 10/12/2023    8:10 PM  CBC  WBC 4.0 - 10.5 K/uL 7.6  8.2  9.4   Hemoglobin 12.0 - 15.0 g/dL 9.1  9.1  16.1   Hematocrit 36.0 - 46.0 % 27.1  28.2  32.5   Platelets 150 - 400 K/uL 288  281  346    Lab Results  Component Value Date   IRON 58 10/13/2023   TIBC 399 10/13/2023   FERRITIN 17 10/13/2023    I personally reviewed and interpreted the available labs, imaging and endoscopic files.  01/05  CT of the abdomen and pelvis without IV contrast showed extensive atherosclerosis but no other abnormalities.   Impression and Plan:  KASSIDIE HENDRIKS is a 78 y.o. female with  history of diabetes, hyperlipidemia and hypertension,  who presents for evaluation of Unintentional weight loss, iron deficiency, history of TA; surveillance colonoscopy and intermittent abdominal pain  #Iron deficiency anemia #Surveillance colonoscopy  Ferritin : 17 HBG: 9.1  Patient does have evidence of iron deficiency anemia without any overt GI bleed.  This is an alarm symptom in this pleasant elderly female and further workup is indicated  Patient had undergone upper endoscopy without any overt bleeding but there was edematous fold in the cardia concerning for gastric varices, which may require further workup with EUS in future  Patient had last colonoscopy 2019 and suggested repeat was 2024  At this time we discussed risk-benefit limitation and alternatives to colonoscopy.  Patient would like to proceed with colonoscopy for evaluation of iron deficiency anemia and history of tubular adenomas.    If colonoscopy is negative and anemia persist may require capsule endoscopy in future  - Will consider EUS / Biopsy of edematous cardiac fold in future   #Unintentional weight loss Patient with recorded 10/23 was 150 pounds and today is 134 pounds  There could be a competent of chronic mesenteric ischemia given extensive atherosclerosis leading to unintentional weight loss but further dedicated imaging with  IV contrast is indicated  -  CT angio abdomen and pelvis with IV contrast if to assess for chronic mesentric ischemia given extensive atherosclerosis , any gastric varices and etiology of unintentional weight loss .  This to be done after repeating BMP and his kidney function has been normalized only  #HTN The patient was found to have elevated blood pressure when vital signs were checked in the office. The blood pressure was rechecked by the nursing staff and it was found be persistently elevated >140/90 mmHg. I personally advised to the patient to follow up closely with PCP for hypertension control.    All questions were answered.      Emma Lawman, MD Gastroenterology and Hepatology [facility] Gastroenter[facility]   This chart has been completed using [facility] Dictation software, and while attempts have been made to ensure accuracy , certain words and phrases may not be transcribed as intended

## 2023-11-16 NOTE — Progress Notes (Signed)
Vista Lawman , M.D. Gastroenterology & Hepatology [facility]enter[facility] [address] [place], [place] [address] Primary Care Physician: Assunta Found, MD [address] [place] [address]  Chief Complaint: Unintentional weight loss, iron deficiency, history of TA; surveillance colonoscopy and intermittent abdominal pain   History of Present Illness:  [name] is a 78 y.o. female with  history of diabetes, hyperlipidemia and hypertension,  who presents for evaluation of Unintentional weight loss, iron deficiency, history of TA; surveillance colonoscopy and intermittent abdominal pain  Patient was recently admitted in the hospital for poor oral intake, weight loss abdominal pain and vomiting was seen by GI for his first GI clinic appointment.  Patient previously has primary gastroenterologist Dr. Ewing Schlein and requesting to transfer care here at [facility]  Fortunately patient nausea vomiting has resolved.  She reports good appetite now but has intermittent epigastric discomfort without any aggravating or relieving factors The patient denies having any nausea, vomiting, fever, chills, hematochezia, melena, hematemesis,diarrhea, jaundice, pruritus  Patient with recorded 10/23 was 150 pounds and today is 134 pounds  Last EGD:10/14/2023 - 5 cm hiatal hernia.  - Congested and texture changed mucosa in the cardia. This was not biopsied as isolated gastric varices could not be excluded  - Erythematous mucosa in the antrum. Biopsied.  - Normal duodenal bulb and second portion of the duodenum.  Last Colonoscopy:2019 Dr. Ewing Schlein (GI) in [place], last EGD and colonoscopy in 2019. EGD was found "to have inflammation at the GE junction. 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 The only other finding was presbyesophagus".  Colonoscopy was reported to have an adenoma and was recommended to have a repeat colonoscopy in 2024.   FHx: neg for any  gastrointestinal/liver disease, no malignancies Social: neg smoking, alcohol or illicit drug use Surgical: no abdominal surgeries  Past Medical History: Past Medical History:  Diagnosis Date   Diabetes mellitus without complication (HCC)    High cholesterol    Hypertension     Past Surgical History: Past Surgical History:  Procedure Laterality Date   BIOPSY  10/14/2023   Procedure: BIOPSY;  Surgeon: Franky Macho, MD;  [facility];  Service: Endoscopy;;   ESOPHAGOGASTRODUODENOSCOPY (EGD) WITH PROPOFOL N/A 10/14/2023   Procedure: ESOPHAGOGASTRODUODENOSCOPY (EGD) WITH PROPOFOL;  Surgeon: Franky Macho, MD;  [facility];  Service: Endoscopy;  Laterality: N/A;   FOOT SURGERY      Family History:History reviewed. No pertinent family history.  Social History: Social History   Tobacco Use  Smoking Status Never  Smokeless Tobacco Never   Social History   Substance and Sexual Activity  Alcohol Use Never   Social History   Substance and Sexual Activity  Drug Use Never    Allergies: No Known Allergies  Medications: Current Outpatient Medications  Medication Sig Dispense Refill   ACCU-CHEK AVIVA PLUS test strip 1 each by Other route 4 (four) times daily.   5   ACCU-CHEK FASTCLIX LANCETS MISC U TO TEST QID  10   amLODipine (NORVASC) 5 MG tablet Take 5 mg by mouth daily.     aspirin 81 MG chewable tablet Chew 1 tablet (81 mg total) by mouth daily.     cloNIDine (CATAPRES) 0.1 MG tablet Take 0.1 mg by mouth 2 (two) times daily.     escitalopram (LEXAPRO) 10 MG tablet Take 10 mg by mouth daily.   1   fesoterodine (TOVIAZ) 8 MG TB24 tablet Take 1 tablet (8 mg  total) by mouth daily. 30 tablet 11   JANUMET 50-1000 MG tablet Take 1 tablet by mouth 2 (two) times daily.     levothyroxine (SYNTHROID, LEVOTHROID) 100 MCG tablet Take 100 mcg by mouth daily.   3   olmesartan (BENICAR) 40 MG tablet Take 40 mg by mouth daily.     pantoprazole (PROTONIX) 40 MG  tablet Take 1 tablet (40 mg total) by mouth daily at 6 (six) AM. 30 tablet 1   PREVIDENT 5000 DRY MOUTH 1.1 % GEL dental gel Place 1 Application onto teeth at bedtime.     simvastatin (ZOCOR) 40 MG tablet Take 40 mg by mouth daily.   3   ondansetron (ZOFRAN) 4 MG tablet Take 1 tablet (4 mg total) by mouth every 6 (six) hours. (Patient not taking: Reported on 11/16/2023) 12 tablet 0   No current facility-administered medications for this visit.    Review of Systems: GENERAL: negative for malaise, night sweats HEENT: No changes in hearing or vision, no nose bleeds or other nasal problems. NECK: Negative for lumps, goiter, pain and significant neck swelling RESPIRATORY: Negative for cough, wheezing CARDIOVASCULAR: Negative for chest pain, leg swelling, palpitations, orthopnea GI: SEE HPI MUSCULOSKELETAL: Negative for joint pain or swelling, back pain, and muscle pain. SKIN: Negative for lesions, rash HEMATOLOGY Negative for prolonged bleeding, bruising easily, and swollen nodes. ENDOCRINE: Negative for cold or heat intolerance, polyuria, polydipsia and goiter. NEURO: negative for tremor, gait imbalance, syncope and seizures. The remainder of the review of systems is noncontributory.   Physical Exam: BP (!) 182/70 (BP Location: Left Arm, Patient Position: Sitting, Cuff Size: Normal)   Pulse (!) 46   Temp (!) 97.3 F (36.3 C) (Temporal)   Ht 5\' 1"  (1.549 m)   Wt 134 lb 12.8 oz (61.1 kg)   BMI 25.47 kg/m  GENERAL: The patient is AO x3, in no acute distress. HEENT: Head is normocephalic and atraumatic. EOMI are intact. Mouth is well hydrated and without lesions. NECK: Supple. No masses LUNGS: Clear to auscultation. No presence of rhonchi/wheezing/rales. Adequate chest expansion HEART: RRR, normal s1 and s2. ABDOMEN: Soft, nontender, no guarding, no peritoneal signs, and nondistended. BS +. No masses.  Imaging/Labs: as above     Latest Ref Rng & Units 10/15/2023    3:32 AM 10/14/2023     4:13 AM 10/12/2023    8:10 PM  CBC  WBC 4.0 - 10.5 K/uL 7.6  8.2  9.4   Hemoglobin 12.0 - 15.0 g/dL 9.1  9.1  09.8   Hematocrit 36.0 - 46.0 % 27.1  28.2  32.5   Platelets 150 - 400 K/uL 288  281  346    Lab Results  Component Value Date   IRON 58 10/13/2023   TIBC 399 10/13/2023   FERRITIN 17 10/13/2023    I personally reviewed and interpreted the available labs, imaging and endoscopic files.  01/05  CT of the abdomen and pelvis without IV contrast showed extensive atherosclerosis but no other abnormalities.   Impression and Plan:  KEVONNA NOLTE is a 78 y.o. female with  history of diabetes, hyperlipidemia and hypertension,  who presents for evaluation of Unintentional weight loss, iron deficiency, history of TA; surveillance colonoscopy and intermittent abdominal pain  #Iron deficiency anemia #Surveillance colonoscopy  Ferritin : 17 HBG: 9.1  Patient does have evidence of iron deficiency anemia without any overt GI bleed.  This is an alarm symptom in this pleasant elderly female and further workup is indicated  Patient had undergone upper endoscopy without any overt bleeding but there was edematous fold in the cardia concerning for gastric varices, which may require further workup with EUS in future  Patient had last colonoscopy 2019 and suggested repeat was 2024  At this time we discussed risk-benefit limitation and alternatives to colonoscopy.  Patient would like to proceed with colonoscopy for evaluation of iron deficiency anemia and history of tubular adenomas.    If colonoscopy is negative and anemia persist may require capsule endoscopy in future  - Will consider EUS / Biopsy of edematous cardiac fold in future   #Unintentional weight loss Patient with recorded 10/23 was 150 pounds and today is 134 pounds  There could be a competent of chronic mesenteric ischemia given extensive atherosclerosis leading to unintentional weight loss but further dedicated imaging with  IV contrast is indicated  -  CT angio abdomen and pelvis with IV contrast if to assess for chronic mesentric ischemia given extensive atherosclerosis , any gastric varices and etiology of unintentional weight loss .  This to be done after repeating BMP and his kidney function has been normalized only  #HTN The patient was found to have elevated blood pressure when vital signs were checked in the office. The blood pressure was rechecked by the nursing staff and it was found be persistently elevated >140/90 mmHg. I personally advised to the patient to follow up closely with PCP for hypertension control.    All questions were answered.      Vista Lawman, MD Gastroenterology and Hepatology [facility] Gastroenterology   This chart has been completed using [facility] Dictation software, and while attempts have been made to ensure accuracy , certain words and phrases may not be transcribed as intended

## 2023-11-17 ENCOUNTER — Telehealth (INDEPENDENT_AMBULATORY_CARE_PROVIDER_SITE_OTHER): Payer: Self-pay | Admitting: Gastroenterology

## 2023-11-17 LAB — COMPREHENSIVE METABOLIC PANEL
ALT: 14 [IU]/L (ref 0–32)
AST: 14 [IU]/L (ref 0–40)
Albumin: 4.2 g/dL (ref 3.8–4.8)
Alkaline Phosphatase: 45 [IU]/L (ref 44–121)
BUN/Creatinine Ratio: 8 — ABNORMAL LOW (ref 12–28)
BUN: 13 mg/dL (ref 8–27)
Bilirubin Total: 0.4 mg/dL (ref 0.0–1.2)
CO2: 26 mmol/L (ref 20–29)
Calcium: 9.4 mg/dL (ref 8.7–10.3)
Chloride: 97 mmol/L (ref 96–106)
Creatinine, Ser: 1.71 mg/dL — ABNORMAL HIGH (ref 0.57–1.00)
Globulin, Total: 1.8 g/dL (ref 1.5–4.5)
Glucose: 206 mg/dL — ABNORMAL HIGH (ref 70–99)
Potassium: 4.5 mmol/L (ref 3.5–5.2)
Sodium: 138 mmol/L (ref 134–144)
Total Protein: 6 g/dL (ref 6.0–8.5)
eGFR: 30 mL/min/{1.73_m2} — ABNORMAL LOW (ref >59)

## 2023-11-17 MED ORDER — PEG 3350-KCL-NA BICARB-NACL 420 G PO SOLR: 4000.0000 mL | Freq: Once | ORAL | 0 refills | Status: AC

## 2023-11-17 NOTE — Telephone Encounter (Signed)
 Contacted pt to schedule TCS.Pt scheduled for 11/25/23 at 10:00am. Instructions will be mailed once pre op is received. Prep sent to pharmacy. No pa needed.   Ct scheduled for 12/07/23 at 12:00pm; arriving at 11:30am

## 2023-11-22 ENCOUNTER — Ambulatory Visit (INDEPENDENT_AMBULATORY_CARE_PROVIDER_SITE_OTHER): Payer: Federal, State, Local not specified - PPO | Admitting: Urology

## 2023-11-22 VITALS — BP 199/71 | HR 66

## 2023-11-22 DIAGNOSIS — N3281 Overactive bladder: Secondary | ICD-10-CM | POA: Diagnosis not present

## 2023-11-22 LAB — URINALYSIS, ROUTINE W REFLEX MICROSCOPIC
Bilirubin, UA: NEGATIVE
Ketones, UA: NEGATIVE
Leukocytes,UA: NEGATIVE
Nitrite, UA: NEGATIVE
RBC, UA: NEGATIVE
Specific Gravity, UA: 1.015 (ref 1.005–1.030)
Urobilinogen, Ur: 0.2 mg/dL (ref 0.2–1.0)
pH, UA: 7 (ref 5.0–7.5)

## 2023-11-22 LAB — MICROSCOPIC EXAMINATION
Bacteria, UA: NONE SEEN
RBC, Urine: NONE SEEN /[HPF] (ref 0–2)

## 2023-11-22 MED ORDER — FESOTERODINE FUMARATE ER 8 MG PO TB24
8.0000 mg | ORAL_TABLET | Freq: Every day | ORAL | 11 refills | Status: AC
  Filled 2024-04-27 – 2024-06-07 (×4): qty 30, 30d supply, fill #0
  Filled 2024-07-03 – 2024-07-18 (×3): qty 30, 30d supply, fill #1
  Filled 2024-08-18 – 2024-08-28 (×2): qty 30, 30d supply, fill #2
  Filled 2024-09-23 – 2024-10-06 (×2): qty 30, 30d supply, fill #3
  Filled 2024-11-01 – 2024-11-02 (×2): qty 30, 30d supply, fill #4

## 2023-11-22 NOTE — Progress Notes (Signed)
11/22/2023 2:23 PM   [name] 29-May-1946 [id number]  Referring provider: Elfredia Nevins, MD [address] [place],  [place] [address]  Nocturia   HPI: Ms Marrone is a 77yo here for followup for OAB and nocturia. She remains on toviaz 8mg  daily. She has nocturia 3-4x. Her urinary frequency was every 5 hours but now she is going every hour. She denies dysuria. No straining to urinate. No urinary hesitancy.  She has been out of her DMII meds for a week. UA shows 2+ glucose.    PMH: Past Medical History:  Diagnosis Date   Diabetes mellitus without complication (HCC)    High cholesterol    Hypertension     Surgical History: Past Surgical History:  Procedure Laterality Date   BIOPSY  10/14/2023   Procedure: BIOPSY;  Surgeon: Franky Macho, MD;  [facility];  Service: Endoscopy;;   ESOPHAGOGASTRODUODENOSCOPY (EGD) WITH PROPOFOL N/A 10/14/2023   Procedure: ESOPHAGOGASTRODUODENOSCOPY (EGD) WITH PROPOFOL;  Surgeon: Franky Macho, MD;  [facility];  Service: Endoscopy;  Laterality: N/A;   FOOT SURGERY      Home Medications:  Allergies as of 11/22/2023   No Known Allergies      Medication List        Accurate as of November 22, 2023  2:23 PM. If you have any questions, ask your nurse or doctor.          Accu-Chek Aviva Plus test strip Generic drug: glucose blood 1 each by Other route 4 (four) times daily.   Accu-Chek FastClix Lancets Misc U TO TEST QID   amLODipine 5 MG tablet Commonly known as: NORVASC Take 5 mg by mouth daily.   aspirin 81 MG chewable tablet Chew 1 tablet (81 mg total) by mouth daily.   cloNIDine 0.1 MG tablet Commonly known as: CATAPRES Take 0.1 mg by mouth 2 (two) times daily.   escitalopram 10 MG tablet Commonly known as: LEXAPRO Take 10 mg by mouth daily.   fesoterodine 8 MG Tb24 tablet Commonly known as: Toviaz Take 1 tablet (8 mg total) by mouth daily.   Janumet 50-1000 MG tablet Generic drug:  sitaGLIPtin-metformin Take 1 tablet by mouth 2 (two) times daily.   levothyroxine 100 MCG tablet Commonly known as: SYNTHROID Take 100 mcg by mouth daily.   olmesartan 40 MG tablet Commonly known as: BENICAR Take 40 mg by mouth daily.   ondansetron 4 MG tablet Commonly known as: ZOFRAN Take 1 tablet (4 mg total) by mouth every 6 (six) hours.   pantoprazole 40 MG tablet Commonly known as: PROTONIX Take 1 tablet (40 mg total) by mouth daily at 6 (six) AM.   PreviDent 5000 Dry Mouth 1.1 % Gel dental gel Generic drug: sodium fluoride Place 1 Application onto teeth at bedtime.   simvastatin 40 MG tablet Commonly known as: ZOCOR Take 40 mg by mouth daily.        Allergies: No Known Allergies  Family History: No family history on file.  Social History:  reports that she has never smoked. She has never used smokeless tobacco. She reports that she does not drink alcohol and does not use drugs.  ROS: All other review of systems were reviewed and are negative except what is noted above in HPI  Physical Exam: BP (!) 199/71   Pulse 66   Constitutional:  Alert and oriented, No acute distress. HEENT: [place] AT, moist mucus membranes.  Trachea midline, no masses. Cardiovascular: No clubbing, cyanosis, or  edema. Respiratory: Normal respiratory effort, no increased work of breathing. GI: Abdomen is soft, nontender, nondistended, no abdominal masses GU: No CVA tenderness.  Lymph: No cervical or inguinal lymphadenopathy. Skin: No rashes, bruises or suspicious lesions. Neurologic: Grossly intact, no focal deficits, moving all 4 extremities. Psychiatric: Normal mood and affect.  Laboratory Data: Lab Results  Component Value Date   WBC 7.6 10/15/2023   HGB 9.1 (L) 10/15/2023   HCT 27.1 (L) 10/15/2023   MCV 88.9 10/15/2023   PLT 288 10/15/2023    Lab Results  Component Value Date   CREATININE 1.71 (H) 11/16/2023    No results found for: "PSA"  No results found for:  "TESTOSTERONE"  Lab Results  Component Value Date   HGBA1C 7.1 (H) 10/12/2023    Urinalysis    Component Value Date/Time   COLORURINE COLORLESS (A) 10/13/2023 1140   APPEARANCEUR CLEAR 10/13/2023 1140   APPEARANCEUR Hazy (A) 11/25/2022 1505   LABSPEC 1.004 (L) 10/13/2023 1140   PHURINE 7.0 10/13/2023 1140   GLUCOSEU NEGATIVE 10/13/2023 1140   HGBUR NEGATIVE 10/13/2023 1140   BILIRUBINUR NEGATIVE 10/13/2023 1140   BILIRUBINUR Negative 11/25/2022 1505   KETONESUR NEGATIVE 10/13/2023 1140   PROTEINUR NEGATIVE 10/13/2023 1140   NITRITE NEGATIVE 10/13/2023 1140   LEUKOCYTESUR LARGE (A) 10/13/2023 1140    Lab Results  Component Value Date   LABMICR See below: 11/25/2022   WBCUA 0-5 11/25/2022   LABEPIT 0-10 11/25/2022   BACTERIA RARE (A) 10/13/2023    Pertinent Imaging:  No results found for this or any previous visit.  No results found for this or any previous visit.  No results found for this or any previous visit.  No results found for this or any previous visit.  Results for orders placed during the hospital encounter of 10/12/23  US RENAL  Narrative CLINICAL DATA:  Chronic kidney disease  EXAM: RENAL / URINARY TRACT ULTRASOUND COMPLETE  COMPARISON:  CT 10/13/2023  FINDINGS: Right Kidney:  Renal measurements: 11.9 x 4.9 x 6.7 cm = volume: 207 mL. Minimal ectasia of the right renal collecting system. No perinephric fluid. Preserved echotexture.  Left Kidney:  Renal measurements: 13.0 x 5.9 x 5.7 cm = volume: 226 mL. Echogenicity within normal limits. No mass or hydronephrosis visualized.  Bladder:  Appears normal for degree of bladder distention.  Other:  Postvoid the right renal collecting system ectasia persists.  IMPRESSION: Minimal ectasia of the renal collecting systems.   Electronically Signed By: Karen Kays M.D. On: 10/15/2023 13:09  No results found for this or any previous visit.  No results found for this or any previous  visit.  No results found for this or any previous visit.   Assessment & Plan:    1. OAB (overactive bladder) (Primary) Continue toviaz 8mg  daily - Urinalysis, Routine w reflex microscopic   No follow-ups on file.  Wilkie Aye, MD  [facility] [place]

## 2023-11-23 ENCOUNTER — Encounter (HOSPITAL_COMMUNITY): Payer: Self-pay

## 2023-11-23 ENCOUNTER — Encounter (HOSPITAL_COMMUNITY)
Admission: RE | Admit: 2023-11-23 | Discharge: 2023-11-23 | Disposition: A | Discharge status: Home / Self Care | Payer: Medicare Other | Source: Ambulatory Visit | Attending: Gastroenterology | Admitting: Gastroenterology

## 2023-11-23 DIAGNOSIS — Z01810 Encounter for preprocedural cardiovascular examination: Secondary | ICD-10-CM | POA: Diagnosis not present

## 2023-11-23 DIAGNOSIS — I10 Essential (primary) hypertension: Secondary | ICD-10-CM | POA: Diagnosis not present

## 2023-11-23 HISTORY — DX: Nausea with vomiting, unspecified: R11.2

## 2023-11-23 HISTORY — DX: Hypothyroidism, unspecified: E03.9

## 2023-11-23 HISTORY — DX: Overactive bladder: N32.81

## 2023-11-23 HISTORY — DX: Other specified postprocedural states: Z98.890

## 2023-11-25 ENCOUNTER — Ambulatory Visit (HOSPITAL_COMMUNITY): Payer: Medicare Other | Admitting: Anesthesiology

## 2023-11-25 ENCOUNTER — Ambulatory Visit (HOSPITAL_COMMUNITY)
Admission: RE | Admit: 2023-11-25 | Discharge: 2023-11-25 | Disposition: A | Discharge status: Home / Self Care | Payer: Medicare Other | Attending: Gastroenterology | Admitting: Gastroenterology

## 2023-11-25 ENCOUNTER — Encounter (HOSPITAL_COMMUNITY)
Admission: RE | Disposition: A | Discharge status: Home / Self Care | Payer: Self-pay | Source: Home / Self Care | Attending: Gastroenterology

## 2023-11-25 ENCOUNTER — Encounter (INDEPENDENT_AMBULATORY_CARE_PROVIDER_SITE_OTHER): Payer: Self-pay | Admitting: *Deleted

## 2023-11-25 ENCOUNTER — Other Ambulatory Visit: Payer: Self-pay

## 2023-11-25 ENCOUNTER — Encounter (HOSPITAL_COMMUNITY): Payer: Self-pay | Admitting: Gastroenterology

## 2023-11-25 DIAGNOSIS — E119 Type 2 diabetes mellitus without complications: Secondary | ICD-10-CM | POA: Diagnosis not present

## 2023-11-25 DIAGNOSIS — K648 Other hemorrhoids: Secondary | ICD-10-CM | POA: Diagnosis not present

## 2023-11-25 DIAGNOSIS — E7800 Pure hypercholesterolemia, unspecified: Secondary | ICD-10-CM | POA: Insufficient documentation

## 2023-11-25 DIAGNOSIS — K635 Polyp of colon: Secondary | ICD-10-CM

## 2023-11-25 DIAGNOSIS — R634 Abnormal weight loss: Secondary | ICD-10-CM | POA: Diagnosis not present

## 2023-11-25 DIAGNOSIS — D120 Benign neoplasm of cecum: Secondary | ICD-10-CM | POA: Diagnosis not present

## 2023-11-25 DIAGNOSIS — I10 Essential (primary) hypertension: Secondary | ICD-10-CM | POA: Insufficient documentation

## 2023-11-25 DIAGNOSIS — K644 Residual hemorrhoidal skin tags: Secondary | ICD-10-CM | POA: Diagnosis not present

## 2023-11-25 DIAGNOSIS — D649 Anemia, unspecified: Secondary | ICD-10-CM | POA: Insufficient documentation

## 2023-11-25 DIAGNOSIS — D123 Benign neoplasm of transverse colon: Secondary | ICD-10-CM | POA: Insufficient documentation

## 2023-11-25 DIAGNOSIS — Z7984 Long term (current) use of oral hypoglycemic drugs: Secondary | ICD-10-CM | POA: Insufficient documentation

## 2023-11-25 DIAGNOSIS — R109 Unspecified abdominal pain: Secondary | ICD-10-CM | POA: Insufficient documentation

## 2023-11-25 DIAGNOSIS — D509 Iron deficiency anemia, unspecified: Secondary | ICD-10-CM | POA: Diagnosis not present

## 2023-11-25 DIAGNOSIS — Z1211 Encounter for screening for malignant neoplasm of colon: Secondary | ICD-10-CM

## 2023-11-25 DIAGNOSIS — Z7982 Long term (current) use of aspirin: Secondary | ICD-10-CM | POA: Insufficient documentation

## 2023-11-25 DIAGNOSIS — K5730 Diverticulosis of large intestine without perforation or abscess without bleeding: Secondary | ICD-10-CM | POA: Insufficient documentation

## 2023-11-25 DIAGNOSIS — D122 Benign neoplasm of ascending colon: Secondary | ICD-10-CM

## 2023-11-25 DIAGNOSIS — D125 Benign neoplasm of sigmoid colon: Secondary | ICD-10-CM | POA: Diagnosis not present

## 2023-11-25 DIAGNOSIS — Z6825 Body mass index (BMI) 25.0-25.9, adult: Secondary | ICD-10-CM | POA: Diagnosis not present

## 2023-11-25 DIAGNOSIS — Z79890 Hormone replacement therapy (postmenopausal): Secondary | ICD-10-CM | POA: Insufficient documentation

## 2023-11-25 DIAGNOSIS — K5140 Inflammatory polyps of colon without complications: Secondary | ICD-10-CM

## 2023-11-25 DIAGNOSIS — Z79899 Other long term (current) drug therapy: Secondary | ICD-10-CM | POA: Diagnosis not present

## 2023-11-25 DIAGNOSIS — F32A Depression, unspecified: Secondary | ICD-10-CM | POA: Insufficient documentation

## 2023-11-25 HISTORY — PX: POLYPECTOMY: SHX5525

## 2023-11-25 HISTORY — PX: HEMOSTASIS CLIP PLACEMENT: SHX6857

## 2023-11-25 HISTORY — PX: SUBMUCOSAL LIFTING INJECTION: SHX6855

## 2023-11-25 HISTORY — PX: SCLEROTHERAPY: SHX6841

## 2023-11-25 HISTORY — PX: COLONOSCOPY WITH PROPOFOL: SHX5780

## 2023-11-25 LAB — GLUCOSE, CAPILLARY
Glucose-Capillary: 227 mg/dL — ABNORMAL HIGH (ref 70–99)
Glucose-Capillary: 166 mg/dL — ABNORMAL HIGH (ref 70–99)

## 2023-11-25 LAB — HM COLONOSCOPY

## 2023-11-25 SURGERY — COLONOSCOPY WITH PROPOFOL
Anesthesia: General

## 2023-11-25 MED ORDER — LIDOCAINE HCL (PF) 2 % IJ SOLN
INTRAMUSCULAR | Status: AC
  Filled 2023-11-25: qty 45

## 2023-11-25 MED ORDER — PROPOFOL 500 MG/50ML IV EMUL
INTRAVENOUS | Status: AC
  Filled 2023-11-25: qty 450

## 2023-11-25 MED ORDER — PHENYLEPHRINE 80 MCG/ML (10ML) SYRINGE FOR IV PUSH (FOR BLOOD PRESSURE SUPPORT)
PREFILLED_SYRINGE | INTRAVENOUS | Status: DC | PRN
  Administered 2023-11-25: 40 ug via INTRAVENOUS

## 2023-11-25 MED ORDER — FENTANYL CITRATE (PF) 100 MCG/2ML IJ SOLN: 25.0000 ug | INTRAMUSCULAR | Status: DC | PRN

## 2023-11-25 MED ORDER — ESMOLOL HCL 100 MG/10ML IV SOLN
INTRAVENOUS | Status: AC
  Filled 2023-11-25: qty 10

## 2023-11-25 MED ORDER — PHENYLEPHRINE 80 MCG/ML (10ML) SYRINGE FOR IV PUSH (FOR BLOOD PRESSURE SUPPORT)
PREFILLED_SYRINGE | INTRAVENOUS | Status: AC
  Filled 2023-11-25: qty 10

## 2023-11-25 MED ORDER — ONDANSETRON HCL 4 MG/2ML IJ SOLN: 4.0000 mg | Freq: Once | INTRAMUSCULAR | Status: DC

## 2023-11-25 MED ORDER — PROPOFOL 10 MG/ML IV BOLUS
INTRAVENOUS | Status: DC | PRN
  Administered 2023-11-25: 40 mg via INTRAVENOUS

## 2023-11-25 MED ORDER — PHENYLEPHRINE 80 MCG/ML (10ML) SYRINGE FOR IV PUSH (FOR BLOOD PRESSURE SUPPORT)
PREFILLED_SYRINGE | INTRAVENOUS | Status: AC
  Filled 2023-11-25: qty 30

## 2023-11-25 MED ORDER — LACTATED RINGERS IV SOLN: INTRAVENOUS | Status: DC

## 2023-11-25 MED ORDER — LACTATED RINGERS IV SOLN: INTRAVENOUS | Status: DC | PRN

## 2023-11-25 MED ORDER — ESMOLOL HCL 100 MG/10ML IV SOLN
INTRAVENOUS | Status: DC | PRN
  Administered 2023-11-25: 10 mg via INTRAVENOUS

## 2023-11-25 MED ORDER — PROPOFOL 500 MG/50ML IV EMUL
INTRAVENOUS | Status: AC
  Filled 2023-11-25: qty 100

## 2023-11-25 MED ORDER — PROPOFOL 500 MG/50ML IV EMUL
INTRAVENOUS | Status: DC | PRN
  Administered 2023-11-25: 150 ug/kg/min via INTRAVENOUS

## 2023-11-25 MED ORDER — PROPOFOL 10 MG/ML IV BOLUS
INTRAVENOUS | Status: AC
  Filled 2023-11-25: qty 40

## 2023-11-25 MED ORDER — METOCLOPRAMIDE HCL 5 MG/ML IJ SOLN: 10.0000 mg | Freq: Once | INTRAMUSCULAR | Status: DC | PRN

## 2023-11-25 MED ORDER — LIDOCAINE HCL (CARDIAC) PF 100 MG/5ML IV SOSY
PREFILLED_SYRINGE | INTRAVENOUS | Status: DC | PRN
  Administered 2023-11-25: 30 mg via INTRAVENOUS

## 2023-11-25 NOTE — Discharge Instructions (Signed)
Discharge instructions Please read the instructions outlined below and refer to this sheet in the next few weeks. These discharge instructions provide you with general information on caring for yourself after you leave the hospital. Your doctor may also give you specific instructions. While your treatment has been planned according to the most current medical practices available, unavoidable complications occasionally occur. If you have any problems or questions after discharge, please call your doctor. ACTIVITY You may resume your regular activity but move at a slower pace for the next 24 hours.  Take frequent rest periods for the next 24 hours.  Walking will help expel (get rid of) the air and reduce the bloated feeling in your abdomen.  No driving for 24 hours (because of the anesthesia (medicine) used during the test).  You may shower.  Do not sign any important legal documents or operate any machinery for 24 hours (because of the anesthesia used during the test).  NUTRITION Drink plenty of fluids.  You may resume your normal diet.  Begin with a light meal and progress to your normal diet.  Avoid alcoholic beverages for 24 hours or as instructed by your caregiver.  MEDICATIONS You may resume your normal medications unless your caregiver tells you otherwise.  WHAT YOU CAN EXPECT TODAY You may experience abdominal discomfort such as a feeling of fullness or "gas" pains.  FOLLOW-UP Your doctor will discuss the results of your test with you.  SEEK IMMEDIATE MEDICAL ATTENTION IF ANY OF THE FOLLOWING OCCUR: Excessive nausea (feeling sick to your stomach) and/or vomiting.  Severe abdominal pain and distention (swelling).  Trouble swallowing.  Temperature over 101 F (37.8 C).  Rectal bleeding or vomiting of blood.    Avoid  using high dose aspirin including Goody/BC powders, NSAIDs such as Aleve, ibuprofen, naproxen, Motrin, Voltaren or Advil (even the topical ones) for 14 days   I hope  you have a great rest of your week!   Vista Lawman , M.D.. Gastroenterology and Hepatology [facility]

## 2023-11-25 NOTE — Transfer of Care (Signed)
Immediate Anesthesia Transfer of Care Note  Patient: [name]  Procedure(s) Performed: COLONOSCOPY WITH PROPOFOL POLYPECTOMY SUBMUCOSAL LIFTING INJECTION SCLEROTHERAPY HEMOSTASIS CLIP PLACEMENT  Patient Location: Short Stay  Anesthesia Type:General  Level of Consciousness: drowsy  Airway & Oxygen Therapy: Patient Spontanous Breathing and Patient connected to nasal cannula oxygen  Post-op Assessment: Report given to RN and Post -op Vital signs reviewed and stable  Post vital signs: Reviewed and stable  Last Vitals:  Vitals Value Taken Time  BP 148/52 11/25/23 1041  Temp    Pulse 90 11/25/23 1044  Resp 13 11/25/23 1044  SpO2 97 % 11/25/23 1044  Vitals shown include unfiled device data.  Last Pain:  Vitals:   11/25/23 0902  TempSrc:   PainSc: 0-No pain         Complications: No notable events documented.

## 2023-11-25 NOTE — Op Note (Signed)
[facility] Patient Name: [name] Procedure Date: 11/25/2023 8:49 AM MRN: [id number] Date of Birth: 31-Oct-1945 Attending MD: Sanjuan Dame , MD, [id number] CSN: [id number] Age: 78 Admit Type: Outpatient Procedure:                Colonoscopy Indications:              High risk colon cancer surveillance: Personal                            history of colonic polyps, Incidental - Unexplained                            iron deficiency anemia Providers:                Sanjuan Dame, MD, Edrick Kins, RN, Lennice Sites                            Technician, Technician Referring MD:              Medicines:                Monitored Anesthesia Care Complications:            No immediate complications. Estimated Blood Loss:     Estimated blood loss was minimal. Procedure:                Pre-Anesthesia Assessment:                           - Prior to the procedure, a History and Physical                            was performed, and patient medications and                            allergies were reviewed. The patient's tolerance of                            previous anesthesia was also reviewed. The risks                            and benefits of the procedure and the sedation                            options and risks were discussed with the patient.                            All questions were answered, and informed consent                            was obtained. Prior Anticoagulants: The patient has                            taken no anticoagulant or antiplatelet agents                            except  for aspirin. ASA Grade Assessment: III - A                            patient with severe systemic disease. After                            reviewing the risks and benefits, the patient was                            deemed in satisfactory condition to undergo the                            procedure.                           After obtaining informed consent, the colonoscope                             was passed under direct vision. Throughout the                            procedure, the patient's blood pressure, pulse, and                            oxygen saturations were monitored continuously. The                            [id number] [id number]) scope was introduced through the                            anus and advanced to the the cecum, identified by                            appendiceal orifice and ileocecal valve. The                            colonoscopy was technically difficult and complex                            due to significant looping and a tortuous colon.                            Successful completion of the procedure was aided by                            applying abdominal pressure.                           22 Mofidifier used given technically advance case                            requiring 2 large Mucosal resection and lenth of  procedure over 1 hour Scope In: 9:07:58 AM Scope Out: 10:37:44 AM Scope Withdrawal Time: 1 hour 19 minutes 54 seconds  Total Procedure Duration: 1 hour 29 minutes 46 seconds  Findings:      A 12 mm polyp was found in the cecum. The polyp was sessile.       Preparations were made for mucosal resection. Demarcation of the lesion       was performed with high-definition white light and narrow band imaging       to clearly identify the boundaries of the lesion. Eleview was injected       to raise the lesion. Snare mucosal resection was performed. Resection       and retrieval were complete. Resected tissue margins were examined and       clear of polyp tissue.      A 30 mm polyp was found in the ascending colon. The polyp was       carpet-like. Preparations were made for mucosal resection. Demarcation       of the lesion was performed with high-definition white light and narrow       band imaging to clearly identify the boundaries of the lesion. Eleview       was injected to raise  the lesion. Piecemeal mucosal resection using a       snare was performed. Resection and retrieval were complete. Resected       tissue margins were examined and clear of polyp tissue. Vaporization for       tissue destruction using snare was successful. To close a defect after       polypectomy, one hemostatic clip was successfully placed (MR       conditional). Clip manufacturer: [employer]. There was no       bleeding at the end of the procedure.      Six sessile polyps were found in the sigmoid colon, transverse colon and       ascending colon. The polyps were 4 to 9 mm in size. These polyps were       removed with a cold snare. Resection and retrieval were complete.      Scattered diverticula were found in the left colon.      Non-bleeding external and internal hemorrhoids were found during       retroflexion. The hemorrhoids were small. Impression:               - One 12 mm polyp in the cecum, removed with                            mucosal resection. Resected and retrieved. This                            polyp was sponatneously oozing and could explain                            iron deficiency anemia                           - One 30 mm polyp in the ascending colon, removed                            with mucosal resection. Resected and retrieved.  Treated with a hot snare. Clip (MR conditional) was                            placed. Clip manufacturer: [employer].                           - Six 4 to 9 mm polyps in the sigmoid colon, in the                            transverse colon and in the ascending colon,                            removed with a cold snare. Resected and retrieved.                           - Diverticulosis in the left colon.                           - Non-bleeding external and internal hemorrhoids.                           - Mucosal resection was performed. Resection and                            retrieval were  complete.                           - Mucosal resection was performed. Resection and                            retrieval were complete. Moderate Sedation:      Per Anesthesia Care Recommendation:           - Patient has a contact number available for                            emergencies. The signs and symptoms of potential                            delayed complications were discussed with the                            patient. Return to normal activities tomorrow.                            Written discharge instructions were provided to the                            patient.                           - Resume previous diet.                           - Continue present medications.                           -  Await pathology results.                           - Repeat colonoscopy in 9 months for surveillance.                           - Return to GI clinic at appointment to be                            scheduled. Procedure Code(s):        --- Professional ---                           [phone], 22, Colonoscopy, flexible; with endoscopic                            mucosal resection                           [id number], 59, Colonoscopy, flexible; with removal of                            tumor(s), polyp(s), or other lesion(s) by snare                            technique Diagnosis Code(s):        --- Professional ---                           Z86.010, Personal history of colonic polyps                           D12.0, Benign neoplasm of cecum                           D12.2, Benign neoplasm of ascending colon                           D12.5, Benign neoplasm of sigmoid colon                           D12.3, Benign neoplasm of transverse colon (hepatic                            flexure or splenic flexure)                           K64.8, Other hemorrhoids                           K57.30, Diverticulosis of large intestine without                            perforation or abscess without  bleeding CPT copyright 2022 American Medical Association. All rights reserved. The codes documented in this report are preliminary and upon coder review may  be revised to meet current compliance requirements. Sanjuan Dame, MD Sanjuan Dame,  MD 11/25/2023 10:47:43 AM This report has been signed electronically. Number of Addenda: 0

## 2023-11-25 NOTE — Anesthesia Postprocedure Evaluation (Signed)
Anesthesia Post Note  Patient: [name]  Procedure(s) Performed: COLONOSCOPY WITH PROPOFOL POLYPECTOMY SUBMUCOSAL LIFTING INJECTION SCLEROTHERAPY HEMOSTASIS CLIP PLACEMENT  Patient location during evaluation: PACU Anesthesia Type: General Level of consciousness: awake and alert Pain management: pain level controlled Vital Signs Assessment: post-procedure vital signs reviewed and stable Respiratory status: spontaneous breathing, nonlabored ventilation and respiratory function stable Cardiovascular status: blood pressure returned to baseline and stable Postop Assessment: no apparent nausea or vomiting Anesthetic complications: no   No notable events documented.   Last Vitals:  Vitals:   11/25/23 1046 11/25/23 1116  BP: (!) 131/52   Pulse: 83   Resp: 14   Temp:  36.4 C  SpO2: 95%     Last Pain:  Vitals:   11/25/23 1116  TempSrc: Oral  PainSc:                  Roslynn Amble

## 2023-11-25 NOTE — Interval H&P Note (Signed)
History and Physical Interval Note:  11/25/2023 8:57 AM  [name]  has presented today for surgery, with the diagnosis of IRON DEFICIENCY ANEMIA, UNINTENDED WEIGHT LOSS.  The various methods of treatment have been discussed with the patient and family. After consideration of risks, benefits and other options for treatment, the patient has consented to  Procedure(s) with comments: COLONOSCOPY WITH PROPOFOL (N/A) - 10:00AM;ASA 3 as a surgical intervention.  The patient's history has been reviewed, patient examined, no change in status, stable for surgery.  I have reviewed the patient's chart and labs.  Questions were answered to the patient's satisfaction.     Juanetta [employer] [facility]

## 2023-11-25 NOTE — Anesthesia Preprocedure Evaluation (Addendum)
Anesthesia Evaluation  Patient identified by MRN, date of birth, ID band Patient awake    Reviewed: Allergy & Precautions, H&P , NPO status , Patient's Chart, lab work & pertinent test results  History of Anesthesia Complications (+) PONV and history of anesthetic complications  Airway Mallampati: II  TM Distance: >3 FB Neck ROM: Full    Dental no notable dental hx.    Pulmonary neg pulmonary ROS   Pulmonary exam normal breath sounds clear to auscultation       Cardiovascular hypertension, negative cardio ROS Normal cardiovascular exam Rhythm:Regular Rate:Normal     Neuro/Psych  PSYCHIATRIC DISORDERS  Depression    negative neurological ROS  negative psych ROS   GI/Hepatic negative GI ROS, Neg liver ROS,,,  Endo/Other  negative endocrine ROSdiabetes, Well ControlledHypothyroidism    Renal/GU Renal diseasenegative Renal ROS  negative genitourinary   Musculoskeletal negative musculoskeletal ROS (+)    Abdominal Normal abdominal exam  (+)   Peds negative pediatric ROS (+)  Hematology negative hematology ROS (+) Blood dyscrasia, anemia   Anesthesia Other Findings   Reproductive/Obstetrics negative OB ROS                             Anesthesia Physical Anesthesia Plan  ASA: 2  Anesthesia Plan: General   Post-op Pain Management: Minimal or no pain anticipated   Induction: Intravenous  PONV Risk Score and Plan: 1 and Propofol infusion  Airway Management Planned: Simple Face Mask and Nasal Cannula  Additional Equipment:   Intra-op Plan:   Post-operative Plan:   Informed Consent: I have reviewed the patients History and Physical, chart, labs and discussed the procedure including the risks, benefits and alternatives for the proposed anesthesia with the patient or authorized representative who has indicated his/her understanding and acceptance.       Plan Discussed with:  CRNA  Anesthesia Plan Comments:        Anesthesia Quick Evaluation

## 2023-11-26 ENCOUNTER — Encounter (HOSPITAL_COMMUNITY): Payer: Self-pay | Admitting: Gastroenterology

## 2023-11-26 LAB — SURGICAL PATHOLOGY

## 2023-11-29 ENCOUNTER — Other Ambulatory Visit (HOSPITAL_COMMUNITY): Payer: Self-pay | Admitting: Gastroenterology

## 2023-11-29 DIAGNOSIS — E119 Type 2 diabetes mellitus without complications: Secondary | ICD-10-CM | POA: Diagnosis not present

## 2023-11-29 MED ORDER — FERROUS SULFATE 325 (65 FE) MG PO TABS
325.0000 mg | ORAL_TABLET | ORAL | 0 refills | Status: DC
Start: 2023-11-29 — End: 2023-12-14

## 2023-11-29 MED ORDER — POLYETHYLENE GLYCOL 3350 17 G PO PACK: 17.0000 g | PACK | Freq: Two times a day (BID) | ORAL | 0 refills | Status: AC

## 2023-11-29 NOTE — Progress Notes (Signed)
I reviewed the pathology results. Ann, can you send her a letter with the findings as described below please?  Repeat colonoscopy in 9 months   Thanks,  Vista Lawman, MD Gastroenterology and Hepatology [facility]  ---------------------------------------------------------------------------------------------  [facility] [address], [address], [place], [place] [address] Phone:  [phone]   11/29/23 [name], [place]   Dear Kelton Pillar,  I am writing to let you know the results of your recent colonoscopy.  You had a total of 8 polyps removed. The pathology came back as "tubular adenoma and inflammatory polyp." These findings are NOT cancer, but had the tubular adenoma polyps remained in your colon, they could have turned into cancer.   Given these findings, it is recommended that your next colonoscopy be performed in 9 months as you had large polyps removed in pieces .   I am sending you iron supplementation as you became iron deficient to be taking with MiraLAX if you become constipated.  Recommend checking your iron levels and blood count in next few months, if levels do not improve then we will discuss next step which might be capsule endoscopy  I recommend continue to follow-up with [place] in the GI clinic for management of  iron deficiency anemia  Also I value your feedback , so if you get a survey , please take the time to fill it out and thank you for choosing [facility]  Please call us at [phone] if you have persistent problems or have questions about your condition that have not been fully answered at this time.  Sincerely,  Vista Lawman, MD Gastroenterology and Hepatology

## 2023-11-30 ENCOUNTER — Encounter: Payer: Self-pay | Admitting: Urology

## 2023-11-30 ENCOUNTER — Encounter (INDEPENDENT_AMBULATORY_CARE_PROVIDER_SITE_OTHER): Payer: Self-pay | Admitting: *Deleted

## 2023-11-30 NOTE — Patient Instructions (Signed)

## 2023-12-07 ENCOUNTER — Other Ambulatory Visit (INDEPENDENT_AMBULATORY_CARE_PROVIDER_SITE_OTHER): Payer: Self-pay | Admitting: Gastroenterology

## 2023-12-07 ENCOUNTER — Ambulatory Visit (HOSPITAL_COMMUNITY)
Admission: RE | Admit: 2023-12-07 | Discharge: 2023-12-07 | Disposition: A | Discharge status: Home / Self Care | Payer: Medicare Other | Source: Ambulatory Visit | Attending: Gastroenterology | Admitting: Gastroenterology

## 2023-12-07 DIAGNOSIS — I10 Essential (primary) hypertension: Secondary | ICD-10-CM

## 2023-12-07 DIAGNOSIS — K5730 Diverticulosis of large intestine without perforation or abscess without bleeding: Secondary | ICD-10-CM | POA: Diagnosis not present

## 2023-12-07 DIAGNOSIS — R109 Unspecified abdominal pain: Secondary | ICD-10-CM | POA: Diagnosis not present

## 2023-12-07 DIAGNOSIS — R1084 Generalized abdominal pain: Secondary | ICD-10-CM

## 2023-12-07 DIAGNOSIS — R634 Abnormal weight loss: Secondary | ICD-10-CM

## 2023-12-07 DIAGNOSIS — D500 Iron deficiency anemia secondary to blood loss (chronic): Secondary | ICD-10-CM | POA: Diagnosis not present

## 2023-12-07 DIAGNOSIS — I864 Gastric varices: Secondary | ICD-10-CM

## 2023-12-07 DIAGNOSIS — K559 Vascular disorder of intestine, unspecified: Secondary | ICD-10-CM | POA: Diagnosis not present

## 2023-12-07 DIAGNOSIS — K449 Diaphragmatic hernia without obstruction or gangrene: Secondary | ICD-10-CM | POA: Diagnosis not present

## 2023-12-07 MED ORDER — IOHEXOL 350 MG/ML SOLN
75.0000 mL | Freq: Once | INTRAVENOUS | Status: AC | PRN
  Administered 2023-12-07: 75 mL via INTRAVENOUS

## 2023-12-07 MED ORDER — IOHEXOL 350 MG/ML SOLN: 80.0000 mL | Freq: Once | INTRAVENOUS | Status: DC | PRN

## 2023-12-14 ENCOUNTER — Other Ambulatory Visit (INDEPENDENT_AMBULATORY_CARE_PROVIDER_SITE_OTHER): Payer: Self-pay | Admitting: *Deleted

## 2023-12-14 MED ORDER — FERROUS SULFATE 325 (65 FE) MG PO TABS: 325.0000 mg | ORAL_TABLET | ORAL | 0 refills | Status: DC

## 2023-12-14 NOTE — Progress Notes (Signed)
 Hi Wendy ,  Can you please call the patient and tell the patient the CT abdomen did not show anything concerning; no evidence of mesenteric ischemia meaning blood flow to your organs are intact and no signs of any cancer was reported. this is all good news  Thanks,  Vista Lawman, MD Gastroenterology and Hepatology [facility] ============================  CT Angio Abdomen pelvis:  IMPRESSION: 1. No acute findings. 2. No evidence of mesenteric ischemia. 3. Small hiatal hernia. 4. Sigmoid diverticulosis. 5.  Aortic Atherosclerosis (ICD10-I70.0).

## 2023-12-15 DIAGNOSIS — Z20822 Contact with and (suspected) exposure to covid-19: Secondary | ICD-10-CM | POA: Diagnosis not present

## 2023-12-15 DIAGNOSIS — R5383 Other fatigue: Secondary | ICD-10-CM | POA: Diagnosis not present

## 2023-12-21 DIAGNOSIS — Z6823 Body mass index (BMI) 23.0-23.9, adult: Secondary | ICD-10-CM | POA: Diagnosis not present

## 2023-12-21 DIAGNOSIS — Z8679 Personal history of other diseases of the circulatory system: Secondary | ICD-10-CM | POA: Diagnosis not present

## 2023-12-21 DIAGNOSIS — I10 Essential (primary) hypertension: Secondary | ICD-10-CM | POA: Diagnosis not present

## 2023-12-24 DIAGNOSIS — N189 Chronic kidney disease, unspecified: Secondary | ICD-10-CM | POA: Diagnosis not present

## 2023-12-24 DIAGNOSIS — N1832 Chronic kidney disease, stage 3b: Secondary | ICD-10-CM | POA: Diagnosis not present

## 2023-12-24 DIAGNOSIS — E1122 Type 2 diabetes mellitus with diabetic chronic kidney disease: Secondary | ICD-10-CM | POA: Diagnosis not present

## 2023-12-24 DIAGNOSIS — R8290 Unspecified abnormal findings in urine: Secondary | ICD-10-CM | POA: Diagnosis not present

## 2023-12-24 DIAGNOSIS — R634 Abnormal weight loss: Secondary | ICD-10-CM | POA: Diagnosis not present

## 2023-12-24 DIAGNOSIS — N179 Acute kidney failure, unspecified: Secondary | ICD-10-CM | POA: Diagnosis not present

## 2023-12-30 DIAGNOSIS — I10 Essential (primary) hypertension: Secondary | ICD-10-CM | POA: Diagnosis not present

## 2023-12-30 DIAGNOSIS — N179 Acute kidney failure, unspecified: Secondary | ICD-10-CM | POA: Diagnosis not present

## 2023-12-30 DIAGNOSIS — N1832 Chronic kidney disease, stage 3b: Secondary | ICD-10-CM | POA: Diagnosis not present

## 2023-12-30 DIAGNOSIS — E1122 Type 2 diabetes mellitus with diabetic chronic kidney disease: Secondary | ICD-10-CM | POA: Diagnosis not present

## 2024-01-03 NOTE — Progress Notes (Unsigned)
 Cardiology Office Note   Date:  01/06/2024   ID:  [name], [name] 03-May-1946, MRN [id number]  PCP:  Assunta Found, MD  Cardiologist:   None Referring:  Assunta Found, MD   Chief Complaint  Patient presents with   Coronary Artery Disease      History of Present Illness: [name] is a 78 y.o. female who presents for evaluation of HTN.  She has had 4 months of vomiting and has had workup.  She has had colonoscopy and endoscopy.  There has been no clear etiology.  She said that since this time her blood pressure has been going up.  She brings me readings and while it is somewhat labile with systolics are probably averaging in the 160 range with diastolics in the 70s to 90s.  She was started on clonidine not long ago.  She did not notice any effect with this.  She has been on amlodipine in olmesartan.  She denies any cardiovascular symptoms such as chest pressure, neck or arm discomfort.  She does not really report any palpitations, presyncope or syncope.  She has had no weight gain or edema.  She does do some walking for exercise and she did this yesterday without significant limitations.  She lives with one of her children.  Her only cardiac history includes evaluation when she was in the hospital in 2021 for COVID.  I see an echocardiogram that demonstrated well-preserved ejection fraction.  There were no significant valvular abnormalities.   Past Medical History:  Diagnosis Date   Cancer [facility]) 1983   Breast Cancer   Diabetes mellitus without complication (HCC)    High cholesterol    Hypertension    Hypothyroidism    Overactive bladder    PONV (postoperative nausea and vomiting)     Past Surgical History:  Procedure Laterality Date   ABDOMINAL HYSTERECTOMY     BIOPSY  10/14/2023   Procedure: BIOPSY;  Surgeon: Franky Macho, MD;  [facility];  Service: Endoscopy;;   COLONOSCOPY WITH PROPOFOL N/A 11/25/2023   Procedure: COLONOSCOPY WITH PROPOFOL;  Surgeon:  Franky Macho, MD;  [facility];  Service: Endoscopy;  Laterality: N/A;  10:00AM;ASA 3   ESOPHAGOGASTRODUODENOSCOPY (EGD) WITH PROPOFOL N/A 10/14/2023   Procedure: ESOPHAGOGASTRODUODENOSCOPY (EGD) WITH PROPOFOL;  Surgeon: Franky Macho, MD;  [facility];  Service: Endoscopy;  Laterality: N/A;   FOOT SURGERY     HEMOSTASIS CLIP PLACEMENT  11/25/2023   Procedure: HEMOSTASIS CLIP PLACEMENT;  Surgeon: Franky Macho, MD;  [facility];  Service: Endoscopy;;   POLYPECTOMY  11/25/2023   Procedure: POLYPECTOMY;  Surgeon: Franky Macho, MD;  [facility];  Service: Endoscopy;;  hot and cold   SCLEROTHERAPY  11/25/2023   Procedure: SCLEROTHERAPY;  Surgeon: Franky Macho, MD;  [facility];  Service: Endoscopy;;   SUBMUCOSAL LIFTING INJECTION  11/25/2023   Procedure: SUBMUCOSAL LIFTING INJECTION;  Surgeon: Franky Macho, MD;  [facility];  Service: Endoscopy;;     Current Outpatient Medications  Medication Sig Dispense Refill   ACCU-CHEK AVIVA PLUS test strip 1 each by Other route 4 (four) times daily.   5   ACCU-CHEK FASTCLIX LANCETS MISC U TO TEST QID  10   amLODipine (NORVASC) 10 MG tablet Take 10 mg by mouth daily.     aspirin 81 MG chewable tablet Chew 1 tablet (81 mg total) by mouth daily.  donepezil (ARICEPT) 5 MG tablet Take 5 mg by mouth.     escitalopram (LEXAPRO) 10 MG tablet Take 10 mg by mouth daily.   1   ferrous sulfate 325 (65 FE) MG tablet Take 1 tablet (325 mg total) by mouth every other day. 60 tablet 0   fesoterodine (TOVIAZ) 8 MG TB24 tablet Take 1 tablet (8 mg total) by mouth daily. 30 tablet 11   JANUMET 50-1000 MG tablet Take 1 tablet by mouth 2 (two) times daily.     levothyroxine (SYNTHROID, LEVOTHROID) 100 MCG tablet Take 100 mcg by mouth daily.   3   metoprolol tartrate (LOPRESSOR) 50 MG tablet Take 50 mg by mouth 2 (two) times daily.     olmesartan (BENICAR) 40 MG tablet Take 40  mg by mouth daily.     ondansetron (ZOFRAN) 4 MG tablet Take 1 tablet (4 mg total) by mouth every 6 (six) hours. 12 tablet 0   ondansetron (ZOFRAN-ODT) 4 MG disintegrating tablet Take 4-8 mg by mouth every 6 (six) hours as needed.     pantoprazole (PROTONIX) 40 MG tablet Take 1 tablet (40 mg total) by mouth daily at 6 (six) AM. 30 tablet 1   PREVIDENT 5000 DRY MOUTH 1.1 % GEL dental gel Place 1 Application onto teeth at bedtime.     simvastatin (ZOCOR) 40 MG tablet Take 40 mg by mouth daily.   3   cloNIDine (CATAPRES) 0.2 MG tablet Take 1 tablet (0.2 mg total) by mouth 2 (two) times daily. 180 tablet 3   polyethylene glycol (MIRALAX / GLYCOLAX) 17 g packet Take 17 g by mouth 2 (two) times daily. (Patient not taking: Reported on 01/06/2024) 180 packet 0   No current facility-administered medications for this visit.    Allergies:   Patient has no known allergies.    Social History:  The patient  reports that she has never smoked. She has never used smokeless tobacco. She reports that she does not drink alcohol and does not use drugs.   Family History:  The patient's family history includes Brain cancer in her father; Heart failure in her mother.    ROS:  Please see the history of present illness.   Otherwise, review of systems are positive for none.   All other systems are reviewed and negative.    PHYSICAL EXAM: VS:  BP (!) 160/70   Pulse 66   Ht 5\' 3"  (1.6 m)   Wt 134 lb (60.8 kg)   SpO2 99%   BMI 23.74 kg/m  , BMI Body mass index is 23.74 kg/m. GENERAL:  Well appearing HEENT:  Pupils equal round and reactive, fundi not visualized, oral mucosa unremarkable NECK:  No jugular venous distention, waveform within normal limits, carotid upstroke brisk and symmetric, no bruits, no thyromegaly LYMPHATICS:  No cervical, inguinal adenopathy LUNGS:  Clear to auscultation bilaterally BACK:  No CVA tenderness CHEST:  Unremarkable HEART:  PMI not displaced or sustained,S1 and S2 within normal  limits, no S3, no S4, no clicks, no rubs, no murmurs ABD:  Flat, positive bowel sounds normal in frequency in pitch, no bruits, no rebound, no guarding, no midline pulsatile mass, no hepatomegaly, no splenomegaly EXT:  2 plus pulses throughout, no edema, no cyanosis no clubbing SKIN:  No rashes no nodules NEURO:  Cranial nerves II through XII grossly intact, motor grossly intact throughout PSYCH:  Cognitively intact, oriented to person place and time    EKG:    11/23/2023 sinus rhythm with sinus  arrhythmia, rate 62, axis within normal limits, intervals within normal limits, no acute ST-T wave changes.    Recent Labs: 10/12/2023: TSH 1.554 10/15/2023: Hemoglobin 9.1; Magnesium 2.1; Platelets 288 11/16/2023: ALT 14; BUN 13; Creatinine, Ser 1.71; Potassium 4.5; Sodium 138    Lipid Panel    Component Value Date/Time   TRIG 172 (H) 07/30/2020 2000      Wt Readings from Last 3 Encounters:  01/06/24 134 lb (60.8 kg)  11/25/23 134 lb 12.6 oz (61.1 kg)  11/16/23 134 lb 12.8 oz (61.1 kg)      Other studies Reviewed: Additional studies/ records that were reviewed today include: Hospital records.  . Review of the above records demonstrates:  Please see elsewhere in the note.     ASSESSMENT AND PLAN:  HTN: Her blood pressure is elevated at this point we continue med management.  I might consider workup for secondary causes.  I do notice that she has CT findings consistent with possible adrenal adenoma.  Today and get a stat increase her clonidine to 0.1 mg twice daily.  She can keep a more detailed blood pressure diary and follow-up in our Hypertension Pharm.D. Clinic  Aortic atherosclerosis: This was noted on a recent CT.  Her LDL is 101.  HDL 44.  I will see her back in a few months after the Pharm.D. visit and consider changing her statin.   Current medicines are reviewed at length with the patient today.  The patient does not have concerns regarding medicines.  The following changes  have been made:  no change  Labs/ tests ordered today include:   Orders Placed This Encounter  Procedures   AMB Referral to [facility] Pharm-D     Disposition:   FU with with HTN Clinic Pharm D in about one month.    Signed, Rollene Rotunda, MD  01/06/2024 7:27 PM    [facility]

## 2024-01-06 ENCOUNTER — Ambulatory Visit: Attending: Cardiology | Admitting: Cardiology

## 2024-01-06 ENCOUNTER — Encounter: Payer: Self-pay | Admitting: Cardiology

## 2024-01-06 VITALS — BP 160/70 | HR 66 | Ht 63.0 in | Wt 134.0 lb

## 2024-01-06 DIAGNOSIS — I10 Essential (primary) hypertension: Secondary | ICD-10-CM | POA: Insufficient documentation

## 2024-01-06 MED ORDER — CLONIDINE HCL 0.2 MG PO TABS
0.2000 mg | ORAL_TABLET | Freq: Two times a day (BID) | ORAL | 3 refills | Status: DC
  Filled 2024-04-27 – 2024-05-16 (×2): qty 180, 90d supply, fill #0

## 2024-01-06 NOTE — Patient Instructions (Addendum)
 Medication Instructions:  Increase clonidine 0.2 mg by mouth twice per day. New script sent.  *If you need a refill on your cardiac medications before your next appointment, please call your pharmacy*  Follow-Up: At [facility], you and your health needs are our priority.  As part of our continuing mission to provide you with exceptional heart care, we have created designated Provider Care Teams.  These Care Teams include your primary Cardiologist (physician) and Advanced Practice Providers (APPs -  Physician Assistants and Nurse Practitioners) who all work together to provide you with the care you need, when you need it.  We recommend signing up for the patient portal called "MyChart".  Sign up information is provided on this After Visit Summary.  MyChart is used to connect with patients for Virtual Visits (Telemedicine).  Patients are able to view lab/test results, encounter notes, upcoming appointments, etc.  Non-urgent messages can be sent to your provider as well.   To learn more about what you can do with MyChart, go to [email or web address].    Your next appointment:     Provider:      Please see referral to Pharm D for one month from today.  Other Instructions

## 2024-01-11 DIAGNOSIS — Z6823 Body mass index (BMI) 23.0-23.9, adult: Secondary | ICD-10-CM | POA: Diagnosis not present

## 2024-01-11 DIAGNOSIS — E1122 Type 2 diabetes mellitus with diabetic chronic kidney disease: Secondary | ICD-10-CM | POA: Diagnosis not present

## 2024-01-11 DIAGNOSIS — E1129 Type 2 diabetes mellitus with other diabetic kidney complication: Secondary | ICD-10-CM | POA: Diagnosis not present

## 2024-01-11 DIAGNOSIS — E039 Hypothyroidism, unspecified: Secondary | ICD-10-CM | POA: Diagnosis not present

## 2024-01-11 DIAGNOSIS — D649 Anemia, unspecified: Secondary | ICD-10-CM | POA: Diagnosis not present

## 2024-01-11 DIAGNOSIS — E782 Mixed hyperlipidemia: Secondary | ICD-10-CM | POA: Diagnosis not present

## 2024-01-11 DIAGNOSIS — E7849 Other hyperlipidemia: Secondary | ICD-10-CM | POA: Diagnosis not present

## 2024-01-11 DIAGNOSIS — Z8679 Personal history of other diseases of the circulatory system: Secondary | ICD-10-CM | POA: Diagnosis not present

## 2024-02-22 ENCOUNTER — Ambulatory Visit (INDEPENDENT_AMBULATORY_CARE_PROVIDER_SITE_OTHER): Payer: Federal, State, Local not specified - PPO | Admitting: Gastroenterology

## 2024-02-23 ENCOUNTER — Encounter (INDEPENDENT_AMBULATORY_CARE_PROVIDER_SITE_OTHER): Payer: Self-pay | Admitting: Gastroenterology

## 2024-02-23 ENCOUNTER — Ambulatory Visit (INDEPENDENT_AMBULATORY_CARE_PROVIDER_SITE_OTHER): Admitting: Gastroenterology

## 2024-02-23 VITALS — BP 120/65 | HR 71 | Temp 97.7°F | Ht 60.0 in | Wt 126.7 lb

## 2024-02-23 DIAGNOSIS — D509 Iron deficiency anemia, unspecified: Secondary | ICD-10-CM

## 2024-02-23 DIAGNOSIS — R1084 Generalized abdominal pain: Secondary | ICD-10-CM

## 2024-02-23 DIAGNOSIS — R109 Unspecified abdominal pain: Secondary | ICD-10-CM | POA: Diagnosis not present

## 2024-02-23 DIAGNOSIS — Z860100 Personal history of colon polyps, unspecified: Secondary | ICD-10-CM | POA: Insufficient documentation

## 2024-02-23 DIAGNOSIS — Z860101 Personal history of adenomatous and serrated colon polyps: Secondary | ICD-10-CM

## 2024-02-23 DIAGNOSIS — D500 Iron deficiency anemia secondary to blood loss (chronic): Secondary | ICD-10-CM

## 2024-02-23 DIAGNOSIS — R634 Abnormal weight loss: Secondary | ICD-10-CM

## 2024-02-23 NOTE — Patient Instructions (Signed)
 It was very nice to meet you today, as dicussed with will plan for the following :  1) Blood work  2) think about endoscopic ultrasound and let us  know if you change your mind

## 2024-02-23 NOTE — Progress Notes (Signed)
 Eniyah Eastmond Faizan Jacie Tristan , M.D. Gastroenterology & Hepatology [facility] Gastroenterology [address] [place], [place] [address] Primary Care Physician: Minus Amel, MD [address] [place] [address]  Chief Complaint: Unintentional weight loss, iron deficiency, recent colonoscopy with large tubular adenomas removed  History of Present Illness:  [name] is a 78 y.o. female with  history of diabetes, hyperlipidemia and hypertension,  who presents for follow-up Unintentional weight loss, iron deficiency, recent colonoscopy with large tubular adenomas removed  Patient was last seen in February 2025 for nausea and iron deficiency anemia.  She had underwent upper endoscopy found to have a 5 cm hiatal hernia and congested mucosa in the cardia not biopsied done as inpatient followed up with colonoscopy 11/2023 found to have 8 polyps 2 of them large tubular adenoma removed piecemeal fashion.  Patient continues to have intermittent nausea mostly in the morning unrelated to any food intake.  She takes PPI daily and reports it does help some.  Patient has been taking iron supplementation every other day  Patient with recorded 10/23 was 150 pounds, last appointment was 134 and today is 126   Last EGD:10/14/2023 - 5 cm hiatal hernia.  - Congested and texture changed mucosa in the cardia. This was not biopsied as isolated gastric varices could not be excluded  - Erythematous mucosa in the antrum. Biopsied.  - Normal duodenal bulb and second portion of the duodenum.   A. STOMACH, RANDOM, BIOPSY:  Gastric mucosa with hyperemia.  Negative for Helicobacter pylori.  Negative for dysplasia or malignancy.   Last Colonoscopy:  11/2023  - One 12 mm polyp in the cecum, removed with mucosal resection. Resected and retrieved. This polyp was sponatneously oozing and could explain iron deficiency anemia - One 30 mm polyp in the ascending colon, removed with mucosal  resection. Resected and retrieved. Treated with a hot snare. Clip ( MR conditional) was placed. Clip manufacturer: [employer]. - Six 4 to 9 mm polyps in the sigmoid colon, in the transverse colon and in the ascending colon, removed with a cold snare. Resected and retrieved. - Diverticulosis in the left colon. - Non- bleeding external and internal hemorrhoids. - Mucosal resection was performed. Resection and retrieval were complete. - Mucosal resection was performed. Resection and retrieval were complete.  A. COLON, SIGMOID, ASCENDING, TRANSVERSE, POLYPECTOMY:  Tubular adenoma (s) without high grade dysplasia.   B. CECAL, POLYPECTOMY:  Multiple fragments of inflammatory polyp.   C. COLON, ASCENDING, POLYPECTOMY:  Tubular adenoma (s) without high grade dysplasia.   2019 Dr. Lavaughn [place] (GI) in [place], last EGD and colonoscopy in 2019. EGD was found "to have inflammation at the GE junction. 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 The only other finding was presbyesophagus".  Colonoscopy was reported to have an adenoma and was recommended to have a repeat colonoscopy in 2024.   FHx: neg for any gastrointestinal/liver disease, no malignancies Social: neg smoking, alcohol or illicit drug use Surgical: no abdominal surgeries   Most recent labs from 12/2023 hemoglobin 9.7 platelet 350 Labs from 125 ferritin 17 iron saturation 15  Past Medical History: Past Medical History:  Diagnosis Date   Cancer (HCC) 1983   Breast Cancer   Diabetes mellitus without complication (HCC)    High cholesterol    Hypertension    Hypothyroidism    Overactive bladder    PONV (postoperative nausea and vomiting)     Past Surgical History: Past Surgical History:  Procedure Laterality Date  ABDOMINAL HYSTERECTOMY     BIOPSY  10/14/2023   Procedure: BIOPSY;  Surgeon: Hargis Lias, MD;  [facility];  Service: Endoscopy;;   COLONOSCOPY WITH PROPOFOL  N/A 11/25/2023   Procedure:  COLONOSCOPY WITH PROPOFOL ;  Surgeon: Hargis Lias, MD;  [facility];  Service: Endoscopy;  Laterality: N/A;  10:00AM;ASA 3   ESOPHAGOGASTRODUODENOSCOPY (EGD) WITH PROPOFOL  N/A 10/14/2023   Procedure: ESOPHAGOGASTRODUODENOSCOPY (EGD) WITH PROPOFOL ;  Surgeon: Hargis Lias, MD;  [facility];  Service: Endoscopy;  Laterality: N/A;   FOOT SURGERY     HEMOSTASIS CLIP PLACEMENT  11/25/2023   Procedure: HEMOSTASIS CLIP PLACEMENT;  Surgeon: Hargis Lias, MD;  [facility];  Service: Endoscopy;;   POLYPECTOMY  11/25/2023   Procedure: POLYPECTOMY;  Surgeon: Hargis Lias, MD;  [facility];  Service: Endoscopy;;  hot and cold   SCLEROTHERAPY  11/25/2023   Procedure: SCLEROTHERAPY;  Surgeon: Hargis Lias, MD;  [facility];  Service: Endoscopy;;   SUBMUCOSAL LIFTING INJECTION  11/25/2023   Procedure: SUBMUCOSAL LIFTING INJECTION;  Surgeon: Hargis Lias, MD;  [facility];  Service: Endoscopy;;    Family History: Family History  Problem Relation Age of Onset   Heart failure Mother        68   Brain cancer Father     Social History: Social History   Tobacco Use  Smoking Status Never  Smokeless Tobacco Never   Social History   Substance and Sexual Activity  Alcohol Use Never   Social History   Substance and Sexual Activity  Drug Use Never    Allergies: No Known Allergies  Medications: Current Outpatient Medications  Medication Sig Dispense Refill   ACCU-CHEK AVIVA PLUS test strip 1 each by Other route 4 (four) times daily.   5   ACCU-CHEK FASTCLIX LANCETS MISC U TO TEST QID  10   amLODipine  (NORVASC ) 10 MG tablet Take 10 mg by mouth daily.     aspirin  81 MG chewable tablet Chew 1 tablet (81 mg total) by mouth daily.     cloNIDine  (CATAPRES ) 0.2 MG tablet Take 1 tablet (0.2 mg total) by mouth 2 (two) times daily. 180 tablet 3   donepezil (ARICEPT) 5 MG tablet Take 5 mg by mouth.      escitalopram  (LEXAPRO ) 10 MG tablet Take 10 mg by mouth daily.   1   ferrous sulfate  325 (65 FE) MG tablet Take 1 tablet (325 mg total) by mouth every other day. 60 tablet 0   fesoterodine  (TOVIAZ ) 8 MG TB24 tablet Take 1 tablet (8 mg total) by mouth daily. 30 tablet 11   JANUMET 50-1000 MG tablet Take 1 tablet by mouth 2 (two) times daily.     levothyroxine  (SYNTHROID , LEVOTHROID) 100 MCG tablet Take 100 mcg by mouth daily.   3   metoprolol tartrate (LOPRESSOR) 50 MG tablet Take 50 mg by mouth 2 (two) times daily.     olmesartan (BENICAR) 40 MG tablet Take 40 mg by mouth daily.     ondansetron  (ZOFRAN -ODT) 4 MG disintegrating tablet Take 4-8 mg by mouth every 6 (six) hours as needed.     pantoprazole  (PROTONIX ) 40 MG tablet Take 1 tablet (40 mg total) by mouth daily at 6 (six) AM. 30 tablet 1   PREVIDENT 5000 DRY MOUTH 1.1 % GEL dental gel Place 1 Application onto teeth at bedtime.     simvastatin  (ZOCOR ) 40 MG tablet Take 40  mg by mouth daily.   3   polyethylene glycol (MIRALAX  / GLYCOLAX ) 17 g packet Take 17 g by mouth 2 (two) times daily. (Patient not taking: Reported on 02/23/2024) 180 packet 0   No current facility-administered medications for this visit.    Review of Systems: GENERAL: negative for malaise, night sweats HEENT: No changes in hearing or vision, no nose bleeds or other nasal problems. NECK: Negative for lumps, goiter, pain and significant neck swelling RESPIRATORY: Negative for cough, wheezing CARDIOVASCULAR: Negative for chest pain, leg swelling, palpitations, orthopnea GI: SEE HPI MUSCULOSKELETAL: Negative for joint pain or swelling, back pain, and muscle pain. SKIN: Negative for lesions, rash HEMATOLOGY Negative for prolonged bleeding, bruising easily, and swollen nodes. ENDOCRINE: Negative for cold or heat intolerance, polyuria, polydipsia and goiter. NEURO: negative for tremor, gait imbalance, syncope and seizures. The remainder of the review of systems is  noncontributory.   Physical Exam: BP 120/65 (BP Location: Left Arm, Patient Position: Sitting, Cuff Size: Normal)   Pulse 71   Temp 97.7 F (36.5 C) (Temporal)   Ht 5' (1.524 m)   Wt 126 lb 11.2 oz (57.5 kg)   BMI 24.74 kg/m  GENERAL: The patient is AO x3, in no acute distress. HEENT: Head is normocephalic and atraumatic. EOMI are intact. Mouth is well hydrated and without lesions. NECK: Supple. No masses LUNGS: Clear to auscultation. No presence of rhonchi/wheezing/rales. Adequate chest expansion HEART: RRR, normal s1 and s2. ABDOMEN: Soft, nontender, no guarding, no peritoneal signs, and nondistended. BS +. No masses.  Imaging/Labs: as above     Latest Ref Rng & Units 10/15/2023    3:32 AM 10/14/2023    4:13 AM 10/12/2023    8:10 PM  CBC  WBC 4.0 - 10.5 K/uL 7.6  8.2  9.4   Hemoglobin 12.0 - 15.0 g/dL 9.1  9.1  57.8   Hematocrit 36.0 - 46.0 % 27.1  28.2  32.5   Platelets 150 - 400 K/uL 288  281  346    Lab Results  Component Value Date   IRON 58 10/13/2023   TIBC 399 10/13/2023   FERRITIN 17 10/13/2023    I personally reviewed and interpreted the available labs, imaging and endoscopic files.  01/05  CT of the abdomen and pelvis without IV contrast showed extensive atherosclerosis but no other abnormalities.   CTA Abdomen 11/2023  IMPRESSION: 1. No acute findings. 2. No evidence of mesenteric ischemia. 3. Small hiatal hernia. 4. Sigmoid diverticulosis. 5.  Aortic Atherosclerosis (ICD10-I70.0).  Impression and Plan:  NORMANDY ZITTEL is a 78 y.o. female with  history of diabetes, hyperlipidemia and hypertension,  who presents for evaluation of Unintentional weight loss, iron deficiency, history of TA; surveillance colonoscopy and intermittent abdominal pain  #Iron deficiency anemia #Surveillance colonoscopy #Unintentional weight loss  Patient  had underwent upper endoscopy found to have a 5 cm hiatal hernia and congested mucosa in the cardia not biopsied done as  inpatient followed up with colonoscopy 11/2023 found to have 8 polyps 2 of them large tubular adenoma removed piecemeal fashion. There was one decent size cecal polyp spontaneously oozing which was removed and may explain iron deficiency anemia  Ferritin : 17 HBG: 9.1  Patient had undergone upper endoscopy without any overt bleeding but there was edematous fold in the cardia concerning for gastric varices, which may require further workup with EUS   Will repeat iron profile vitamin B12 folate and copper level.  If patient continues to be iron deficient  will refer to hematology  I discussed the indication risk benefit of EUS to evaluate edematous cardiac for especially when patient continues to have weight loss and has intermittent nausea.  She would like to defer this and think about it.  Patient will let us  know regarding referral for upper EUS  Repeat surveillance colonoscopy November 2025  If iron deficiency persist may require capsule endoscopy in future  #Unintentional weight loss  Patient with recorded 10/23 was 150 pounds, last appointment was 134 and today is 126 lbs   There could be a competent of chronic mesenteric ischemia given extensive atherosclerosis leading to unintentional weight loss   Patient had upper endoscopy and colonoscopy February 2025 followed up with CT angio abdomen pelvis without any overt malignancy  Further workup pending would be capsule endoscopy and upper EUS for edematous gastric cardiac fold/lesion.  Patient would like to hold on to both and reconsider this in next clinic appointment, extensive counseling discussion took place today  All questions were answered.      Braxley Balandran Faizan Meloni Hinz, MD Gastroenterology and Hepatology [facility] Gastroenterology   This chart has been completed using [facility] Dictation software, and while attempts have been made to ensure accuracy , certain words and phrases may not be transcribed as intended

## 2024-02-24 DIAGNOSIS — I129 Hypertensive chronic kidney disease with stage 1 through stage 4 chronic kidney disease, or unspecified chronic kidney disease: Secondary | ICD-10-CM | POA: Diagnosis not present

## 2024-02-24 DIAGNOSIS — Z853 Personal history of malignant neoplasm of breast: Secondary | ICD-10-CM | POA: Diagnosis not present

## 2024-02-24 DIAGNOSIS — I2510 Atherosclerotic heart disease of native coronary artery without angina pectoris: Secondary | ICD-10-CM | POA: Diagnosis not present

## 2024-02-24 DIAGNOSIS — D3501 Benign neoplasm of right adrenal gland: Secondary | ICD-10-CM | POA: Diagnosis not present

## 2024-02-24 DIAGNOSIS — D500 Iron deficiency anemia secondary to blood loss (chronic): Secondary | ICD-10-CM | POA: Diagnosis not present

## 2024-02-24 DIAGNOSIS — F330 Major depressive disorder, recurrent, mild: Secondary | ICD-10-CM | POA: Diagnosis not present

## 2024-02-24 DIAGNOSIS — G3184 Mild cognitive impairment, so stated: Secondary | ICD-10-CM | POA: Diagnosis not present

## 2024-02-24 DIAGNOSIS — E1122 Type 2 diabetes mellitus with diabetic chronic kidney disease: Secondary | ICD-10-CM | POA: Diagnosis not present

## 2024-02-24 DIAGNOSIS — E039 Hypothyroidism, unspecified: Secondary | ICD-10-CM | POA: Diagnosis not present

## 2024-02-24 DIAGNOSIS — N3281 Overactive bladder: Secondary | ICD-10-CM | POA: Diagnosis not present

## 2024-02-24 DIAGNOSIS — N1832 Chronic kidney disease, stage 3b: Secondary | ICD-10-CM | POA: Diagnosis not present

## 2024-02-24 DIAGNOSIS — D126 Benign neoplasm of colon, unspecified: Secondary | ICD-10-CM | POA: Diagnosis not present

## 2024-02-25 ENCOUNTER — Other Ambulatory Visit (HOSPITAL_COMMUNITY): Payer: Self-pay | Admitting: Endocrinology

## 2024-02-25 DIAGNOSIS — E1122 Type 2 diabetes mellitus with diabetic chronic kidney disease: Secondary | ICD-10-CM

## 2024-02-26 LAB — IRON,TIBC AND FERRITIN PANEL
Ferritin: 55 ng/mL (ref 15–150)
Iron Saturation: 32 % (ref 15–55)
Iron: 88 ug/dL (ref 27–139)
Total Iron Binding Capacity: 279 ug/dL (ref 250–450)
UIBC: 191 ug/dL (ref 118–369)

## 2024-02-26 LAB — FOLATE: Folate: 15.1 ng/mL (ref >3.0)

## 2024-02-26 LAB — VITAMIN B12: Vitamin B-12: 357 pg/mL (ref 232–1245)

## 2024-02-26 LAB — COPPER, SERUM: Copper: 81 ug/dL (ref 80–158)

## 2024-02-28 ENCOUNTER — Telehealth (INDEPENDENT_AMBULATORY_CARE_PROVIDER_SITE_OTHER): Payer: Self-pay | Admitting: Gastroenterology

## 2024-02-28 ENCOUNTER — Telehealth: Payer: Self-pay

## 2024-02-28 ENCOUNTER — Ambulatory Visit (INDEPENDENT_AMBULATORY_CARE_PROVIDER_SITE_OTHER): Payer: Self-pay | Admitting: Gastroenterology

## 2024-02-28 NOTE — Telephone Encounter (Signed)
 Hi [name]  Can you please refer this patient to Dr Mansouraty for Upper EUS Diagnosis : abnormal upper endoscopy Congested and texture changed mucosa in the cardia.   Can you please refer her to hematology Diagnosis : anemia  Bertell Broach can you please tell patient to stop iron supplementation and ask her to follow up with hematology as there is no evidence of iron deficiency in recent labs  ===================  Ferritin : 55  %sat: 32 Vitamin B12 : 357 Folate : 15.1 Copper  : 81

## 2024-02-28 NOTE — Telephone Encounter (Signed)
-----   Message from [facility] sent at 02/28/2024  4:57 PM EDT ----- Russ Course, EGD report reviewed. Rule out gastric varices or other abnormality within the cardia. Can be scheduled for upper EUS next available in the next few months based on availability. If the date of scheduling procedure is too far out then referring provider can consider referral elsewhere if necessary. Thanks. GM ----- Message ----- From: Aneita Keens, RN Sent: 02/28/2024  11:24 AM EDT To: Daune Eric., MD   ----- Message ----- From: Vallie Gay Sent: 02/28/2024  11:07 AM EDT To: Aneita Keens, RN  Patient needs Upper EUS Diagnosis : abnormal upper endoscopy Congested and texture changed mucosa in the cardia. Thanks, Lorelee Roger

## 2024-02-28 NOTE — Telephone Encounter (Signed)
 Pt left voicemail stating she would like endoscopic ultrasound scheduled. Please advise. Thank you.

## 2024-02-28 NOTE — Telephone Encounter (Signed)
 Referral sent, they will contact patient with apt

## 2024-02-28 NOTE — Telephone Encounter (Signed)
 Pt contacted and verbalized understanding.

## 2024-02-29 ENCOUNTER — Other Ambulatory Visit: Payer: Self-pay

## 2024-02-29 DIAGNOSIS — R933 Abnormal findings on diagnostic imaging of other parts of digestive tract: Secondary | ICD-10-CM

## 2024-02-29 NOTE — Telephone Encounter (Signed)
 EUS has been entered for 05/11/24 at 9 am at [facility] with GM

## 2024-02-29 NOTE — Telephone Encounter (Signed)
 Left message on machine to call back

## 2024-03-01 NOTE — Telephone Encounter (Signed)
 Left message on machine to call back

## 2024-03-02 NOTE — Telephone Encounter (Signed)
 EUS scheduled, pt instructed and medications reviewed.  Patient instructions mailed to home.  Patient to call with any questions or concerns.

## 2024-03-02 NOTE — Telephone Encounter (Signed)
 Instructed patient of scheduled EGD at [facility] on 7/31

## 2024-03-03 ENCOUNTER — Inpatient Hospital Stay

## 2024-03-03 ENCOUNTER — Inpatient Hospital Stay: Attending: Oncology | Admitting: Oncology

## 2024-03-03 VITALS — BP 117/68 | HR 55 | Temp 97.6°F | Resp 18 | Ht 62.0 in | Wt 126.1 lb

## 2024-03-03 DIAGNOSIS — D649 Anemia, unspecified: Secondary | ICD-10-CM | POA: Diagnosis not present

## 2024-03-03 DIAGNOSIS — N189 Chronic kidney disease, unspecified: Secondary | ICD-10-CM | POA: Insufficient documentation

## 2024-03-03 LAB — COMPREHENSIVE METABOLIC PANEL WITH GFR
ALT: 11 U/L (ref 0–44)
AST: 17 U/L (ref 15–41)
Albumin: 3.7 g/dL (ref 3.5–5.0)
Alkaline Phosphatase: 31 U/L — ABNORMAL LOW (ref 38–126)
Anion gap: 12 (ref 5–15)
BUN: 23 mg/dL (ref 8–23)
CO2: 24 mmol/L (ref 22–32)
Calcium: 8.8 mg/dL — ABNORMAL LOW (ref 8.9–10.3)
Chloride: 95 mmol/L — ABNORMAL LOW (ref 98–111)
Creatinine, Ser: 1.76 mg/dL — ABNORMAL HIGH (ref 0.44–1.00)
GFR, Estimated: 29 mL/min — ABNORMAL LOW (ref >60)
Glucose, Bld: 197 mg/dL — ABNORMAL HIGH (ref 70–99)
Potassium: 4.2 mmol/L (ref 3.5–5.1)
Sodium: 131 mmol/L — ABNORMAL LOW (ref 135–145)
Total Bilirubin: 0.4 mg/dL (ref 0.0–1.2)
Total Protein: 6.1 g/dL — ABNORMAL LOW (ref 6.5–8.1)

## 2024-03-03 LAB — CBC WITH DIFFERENTIAL/PLATELET
Abs Immature Granulocytes: 0.07 10*3/uL (ref 0.00–0.07)
Basophils Absolute: 0.1 10*3/uL (ref 0.0–0.1)
Basophils Relative: 1 %
Eosinophils Absolute: 0.8 10*3/uL — ABNORMAL HIGH (ref 0.0–0.5)
Eosinophils Relative: 8 %
HCT: 29 % — ABNORMAL LOW (ref 36.0–46.0)
Hemoglobin: 10 g/dL — ABNORMAL LOW (ref 12.0–15.0)
Immature Granulocytes: 1 %
Lymphocytes Relative: 22 %
Lymphs Abs: 2.2 10*3/uL (ref 0.7–4.0)
MCH: 32.1 pg (ref 26.0–34.0)
MCHC: 34.5 g/dL (ref 30.0–36.0)
MCV: 92.9 fL (ref 80.0–100.0)
Monocytes Absolute: 0.6 10*3/uL (ref 0.1–1.0)
Monocytes Relative: 6 %
Neutro Abs: 6.1 10*3/uL (ref 1.7–7.7)
Neutrophils Relative %: 62 %
Platelets: 277 10*3/uL (ref 150–400)
RBC: 3.12 MIL/uL — ABNORMAL LOW (ref 3.87–5.11)
RDW: 12.5 % (ref 11.5–15.5)
WBC: 9.8 10*3/uL (ref 4.0–10.5)
nRBC: 0 % (ref 0.0–0.2)

## 2024-03-03 LAB — RETICULOCYTES
Immature Retic Fract: 9.7 % (ref 2.3–15.9)
RBC.: 3.11 MIL/uL — ABNORMAL LOW (ref 3.87–5.11)
Retic Count, Absolute: 32.3 10*3/uL (ref 19.0–186.0)
Retic Ct Pct: 1 % (ref 0.4–3.1)

## 2024-03-03 NOTE — Assessment & Plan Note (Signed)
 Normocytic anemia likely secondary to chronic kidney disease TSAT within goal of 25-30 SPEP: Not available  - Will do workup for multiple myeloma to rule it out including an SPEP, free light chains. - Will repeat a CBC and CMP today to confirm anemia - If the above workup is negative and hemoglobin is less than 10, will start patient on erythropoietin shots every 2 weeks.  Return to clinic in 2 weeks to discuss results and further management

## 2024-03-03 NOTE — Progress Notes (Signed)
 [facility] at [facility]  HEMATOLOGY NEW VISIT  Minus Amel, MD  REASON FOR REFERRAL: Normocytic anemia   HISTORY OF PRESENT ILLNESS: [name] 78 y.o. female referred for normocytic anemia.  Patient has a past medical history of diabetes, hypertension, chronic kidney disease.  She was previously evaluated by Dr. Tova Fresh for iron deficiency anemia and was started on iron pills.  She had a colonoscopy and endoscopy done which did not reveal any active sites of bleeding.  Her iron deficiency is resolved now and she was referred for normocytic anemia.She follows with Dr. Zelda Hickman for her chronic kidney disease  No abdominal pain, fatigue, or shortness of breath. She has experienced recent weight loss and had a CT scan done which was unrevealing.  She denies hematochezia, melena.  She lives with her daughter in [place], near [place], and does not smoke or consume alcohol.  Has no family history of colon cancer  I have reviewed the past medical history, past surgical history, social history and family history with the patient   ALLERGIES:  has no known allergies.  MEDICATIONS:  Current Outpatient Medications  Medication Sig Dispense Refill   ACCU-CHEK AVIVA PLUS test strip 1 each by Other route 4 (four) times daily.   5   ACCU-CHEK FASTCLIX LANCETS MISC U TO TEST QID  10   amLODipine  (NORVASC ) 10 MG tablet Take 10 mg by mouth daily.     aspirin  81 MG chewable tablet Chew 1 tablet (81 mg total) by mouth daily.     cloNIDine  (CATAPRES ) 0.2 MG tablet Take 1 tablet (0.2 mg total) by mouth 2 (two) times daily. 180 tablet 3   donepezil (ARICEPT) 5 MG tablet Take 5 mg by mouth.     escitalopram  (LEXAPRO ) 10 MG tablet Take 10 mg by mouth daily.   1   fesoterodine  (TOVIAZ ) 8 MG TB24 tablet Take 1 tablet (8 mg total) by mouth daily. 30 tablet 11   hydrochlorothiazide (HYDRODIURIL) 25 MG tablet      JANUMET 50-1000 MG tablet Take 1 tablet by mouth 2 (two) times daily.      Lancets Misc. (ACCU-CHEK FASTCLIX LANCET) KIT See admin instructions.     levothyroxine  (SYNTHROID , LEVOTHROID) 100 MCG tablet Take 100 mcg by mouth daily.   3   metoprolol tartrate (LOPRESSOR) 50 MG tablet Take 50 mg by mouth 2 (two) times daily.     olmesartan (BENICAR) 40 MG tablet Take 40 mg by mouth daily.     ondansetron  (ZOFRAN -ODT) 4 MG disintegrating tablet Take 4-8 mg by mouth every 6 (six) hours as needed.     pantoprazole  (PROTONIX ) 40 MG tablet Take 1 tablet (40 mg total) by mouth daily at 6 (six) AM. 30 tablet 1   polyethylene glycol powder (MIRALAX ) 17 GM/SCOOP powder 1 scoop mixed with 8 ounces of fluid Orally Once a day     PREVIDENT 5000 DRY MOUTH 1.1 % GEL dental gel Place 1 Application onto teeth at bedtime.     simvastatin  (ZOCOR ) 40 MG tablet Take 40 mg by mouth daily.   3   ferrous sulfate  325 (65 FE) MG tablet Take 1 tablet (325 mg total) by mouth every other day. (Patient not taking: Reported on 03/03/2024) 60 tablet 0   No current facility-administered medications for this visit.     REVIEW OF SYSTEMS:   Constitutional: Denies fevers, chills or night sweats Eyes: Denies blurriness of vision Ears, nose, mouth, throat, and face: Denies mucositis or  sore throat Respiratory: Denies cough, dyspnea or wheezes Cardiovascular: Denies palpitation, chest discomfort or lower extremity swelling Gastrointestinal:  Denies nausea, heartburn or change in bowel habits Skin: Denies abnormal skin rashes Lymphatics: Denies new lymphadenopathy or easy bruising Neurological:Denies numbness, tingling or new weaknesses Behavioral/Psych: Mood is stable, no new changes  All other systems were reviewed with the patient and are negative.  PHYSICAL EXAMINATION:   Vitals:   03/03/24 1134  BP: 117/68  Pulse: (!) 55  Resp: 18  Temp: 97.6 F (36.4 C)  SpO2: 100%    GENERAL:alert, no distress and comfortable SKIN: skin color, texture, turgor are normal, no rashes or significant  lesions LUNGS: clear to auscultation and percussion with normal breathing effort HEART: regular rate & rhythm and no murmurs and no lower extremity edema ABDOMEN:abdomen soft, non-tender and normal bowel sounds Musculoskeletal:no cyanosis of digits and no clubbing  NEURO: alert & oriented x 3 with fluent speech  LABORATORY DATA:  I have reviewed the data as listed  Lab Results  Component Value Date   WBC 9.8 03/03/2024   NEUTROABS 6.1 03/03/2024   HGB 10.0 (L) 03/03/2024   HCT 29.0 (L) 03/03/2024   MCV 92.9 03/03/2024   PLT 277 03/03/2024      Chemistry      Component Value Date/Time   NA 131 (L) 03/03/2024 1155   NA 138 11/16/2023 1536   K 4.2 03/03/2024 1155   CL 95 (L) 03/03/2024 1155   CO2 24 03/03/2024 1155   BUN 23 03/03/2024 1155   BUN 13 11/16/2023 1536   CREATININE 1.76 (H) 03/03/2024 1155      Component Value Date/Time   CALCIUM 8.8 (L) 03/03/2024 1155   ALKPHOS 31 (L) 03/03/2024 1155   AST 17 03/03/2024 1155   ALT 11 03/03/2024 1155   BILITOT 0.4 03/03/2024 1155   BILITOT 0.4 11/16/2023 1536     Last EGD: 10/14/2023 - 5 cm hiatal hernia.  - Congested and texture changed mucosa in the cardia. This was not biopsied as isolated gastric varices could not be excluded  - Erythematous mucosa in the antrum. Biopsied.  - Normal duodenal bulb and second portion of the duodenum.    A. STOMACH, RANDOM, BIOPSY:  Gastric mucosa with hyperemia.  Negative for Helicobacter pylori.  Negative for dysplasia or malignancy.    Last Colonoscopy: 11/2023   - One 12 mm polyp in the cecum, removed with mucosal resection. Resected and retrieved. This polyp was sponatneously oozing and could explain iron deficiency anemia  - One 30 mm polyp in the ascending colon, removed with mucosal resection. Resected and retrieved. Treated with a hot snare. Clip ( MR conditional) was placed. Clip manufacturer: [employer].  - Six 4 to 9 mm polyps in the sigmoid colon, in the transverse  colon and in the ascending colon, removed with a cold snare. Resected and retrieved.  - Diverticulosis in the left colon.  - Non- bleeding external and internal hemorrhoids.  - Mucosal resection was performed. Resection and retrieval were complete. - Mucosal resection was performed. Resection and retrieval were complete.   A. COLON, SIGMOID, ASCENDING, TRANSVERSE, POLYPECTOMY:  Tubular adenoma (s) without high grade dysplasia.   B. CECAL, POLYPECTOMY:  Multiple fragments of inflammatory polyp.   C. COLON, ASCENDING, POLYPECTOMY:  Tubular adenoma (s) without high grade dysplasia.  RADIOGRAPHIC STUDIES: I have personally reviewed the radiological images as listed and agreed with the findings in the report.  CT Angio Abd/Pel w/ and/or w/o CLINICAL DATA:  Gastric varices, unintentional weight loss, abdominal pain, assess mesenteric ischemia  EXAM: CTA ABDOMEN AND PELVIS WITHOUT AND WITH CONTRAST  TECHNIQUE: Multidetector CT imaging of the abdomen and pelvis was performed using the standard protocol during bolus administration of intravenous contrast. Multiplanar reconstructed images and MIPs were obtained and reviewed to evaluate the vascular anatomy.  RADIATION DOSE REDUCTION: This exam was performed according to the departmental dose-optimization program which includes automated exposure control, adjustment of the mA and/or kV according to patient size and/or use of iterative reconstruction technique.  CONTRAST:  75mL OMNIPAQUE  IOHEXOL  350 MG/ML SOLN  COMPARISON:  10/13/2023 and previous  FINDINGS: VASCULAR  Aorta: Moderate calcified plaque. No aneurysm, dissection, or stenosis.  Celiac: Calcified ostial plaque resulting in short segment stenosis of at least mild severity, patent distally with classic trifurcation anatomy.  SMA: Patent without evidence of aneurysm, dissection, vasculitis or significant stenosis.  Renals: Single right, patent. Single left, with  calcified ostial plaque resulting in short segment mild stenosis, patent distally.  IMA: Calcified ostial plaque resulting in short segment stenosis of at least mild severity, patent distally.  Inflow: Moderate calcified plaque in bilateral common and internal iliac arteries. External iliac arteries normal.  Proximal Outflow: Moderate plaque without high-grade stenosis.  Veins: Patent hepatic veins, portal vein, SM V, splenic vein, bilateral renal veins. Iliac venous system and IVC are incompletely opacified, grossly unremarkable.  Review of the MIP images confirms the above findings.  NON-VASCULAR  Lower chest: Trace pericardial fluid. No pleural effusion. Coarse linear and ground-glass opacities posteriorly in both lung bases  Hepatobiliary: No focal liver abnormality is seen. No gallstones, gallbladder wall thickening, or biliary dilatation.  Pancreas: Unremarkable. No pancreatic ductal dilatation or surrounding inflammatory changes.  Spleen: Normal in size without focal abnormality.  Adrenals/Urinary Tract: Adrenal glands are unremarkable. Kidneys are normal, without renal calculi, focal lesion, or hydronephrosis. Bladder is unremarkable.  Stomach/Bowel: Stomach is nondistended with small hiatal hernia. Small bowel decompressed. Normal appendix. The colon is partially distended, with scattered proximal sigmoid diverticula; no significant adjacent inflammatory change.  Lymphatic: No abdominal or pelvic adenopathy.  Reproductive: Status post hysterectomy. No adnexal masses.  Other: Pelvic phleboliths.  No ascites.  No free air.  Musculoskeletal: Vertebral endplate spurring at multiple levels in the lower thoracic spine. Multilevel spondylitic change near the thoracolumbar junction with mild scoliosis.  IMPRESSION: 1. No acute findings. 2. No evidence of mesenteric ischemia. 3. Small hiatal hernia. 4. Sigmoid diverticulosis. 5.  Aortic Atherosclerosis  (ICD10-I70.0).  Electronically Signed   By: Nicoletta Barrier M.D.   On: 12/12/2023 10:19   ASSESSMENT & PLAN:  Patient is a 77 y.o. female referred for normocytic anemia  Normocytic anemia Normocytic anemia likely secondary to chronic kidney disease TSAT within goal of 25-30 SPEP: Not available  - Will do workup for multiple myeloma to rule it out including an SPEP, free light chains. - Will repeat a CBC and CMP today to confirm anemia - If the above workup is negative and hemoglobin is less than 10, will start patient on erythropoietin shots every 2 weeks.  Return to clinic in 2 weeks to discuss results and further management   Orders Placed This Encounter  Procedures   CBC with Differential/Platelet    Standing Status:   Future    Number of Occurrences:   1    Expected Date:   03/03/2024    Expiration Date:   03/03/2025   Comprehensive metabolic panel with GFR    Standing Status:  Future    Number of Occurrences:   1    Expected Date:   03/03/2024    Expiration Date:   03/03/2025   Kappa/lambda light chains    Standing Status:   Future    Number of Occurrences:   1    Expected Date:   03/03/2024    Expiration Date:   03/03/2025   Multiple Myeloma Panel (SPEP&IFE w/QIG)    Standing Status:   Future    Number of Occurrences:   1    Expected Date:   03/03/2024    Expiration Date:   03/03/2025   Erythropoietin    Standing Status:   Future    Number of Occurrences:   1    Expected Date:   03/03/2024    Expiration Date:   03/03/2025   Reticulocytes    Standing Status:   Future    Number of Occurrences:   1    Expected Date:   03/03/2024    Expiration Date:   03/03/2025   Ambulatory Referral to [facility]    Referral Priority:   Routine    Referral Type:   Consultation    Referral Reason:   Specialty Services Required    Number of Visits Requested:   1    The total time spent in the appointment was 40 minutes encounter with patients including review of chart and various  tests results, discussions about plan of care and coordination of care plan   All questions were answered. The patient knows to call the clinic with any problems, questions or concerns. No barriers to learning was detected.   Eduardo Grade, MD 5/23/20251:37 PM

## 2024-03-03 NOTE — Patient Instructions (Signed)
 VISIT SUMMARY:  Today, you were seen for an evaluation of your anemia and kidney dysfunction. You were referred by Dr. Zelda Hickman for further assessment. We discussed your history of anemia, which was initially thought to be due to iron deficiency, and your ongoing kidney issues. You reported no symptoms like abdominal pain, fatigue, or shortness of breath, but you have experienced recent weight loss.  YOUR PLAN:  -ANEMIA: Anemia is a condition where you do not have enough healthy red blood cells to carry adequate oxygen  to your body's tissues. We are unsure of the exact cause of your anemia at this time. We will order a complete blood count (CBC) and tests for multiple myeloma, a type of blood cancer. If your hemoglobin level drops below 10 g/dL, we may consider using medications to stimulate red blood cell production. We will discuss the results and any further steps in two weeks.  -CHRONIC KIDNEY DISEASE: Chronic kidney disease is a long-term condition where the kidneys do not work as well as they should. This can contribute to anemia. Dr. Zelda Hickman is managing your kidney disease, and we will coordinate with him regarding your treatment and the potential need for medications to stimulate red blood cell production.  INSTRUCTIONS:  Please complete the blood tests as soon as possible. We will discuss the results and any further steps in two weeks. Continue to follow up with Dr. Zelda Hickman for your kidney disease management.

## 2024-03-04 LAB — ERYTHROPOIETIN: Erythropoietin: 9.1 m[IU]/mL (ref 2.6–18.5)

## 2024-03-06 LAB — MULTIPLE MYELOMA PANEL, SERUM
Albumin SerPl Elph-Mcnc: 3.5 g/dL (ref 2.9–4.4)
Albumin/Glob SerPl: 1.6 (ref 0.7–1.7)
Alpha 1: 0.2 g/dL (ref 0.0–0.4)
Alpha2 Glob SerPl Elph-Mcnc: 0.7 g/dL (ref 0.4–1.0)
B-Globulin SerPl Elph-Mcnc: 0.8 g/dL (ref 0.7–1.3)
Gamma Glob SerPl Elph-Mcnc: 0.5 g/dL (ref 0.4–1.8)
Globulin, Total: 2.2 g/dL (ref 2.2–3.9)
IgA: 231 mg/dL (ref 64–422)
IgG (Immunoglobin G), Serum: 578 mg/dL — ABNORMAL LOW (ref 586–1602)
IgM (Immunoglobulin M), Srm: 22 mg/dL — ABNORMAL LOW (ref 26–217)
Total Protein ELP: 5.7 g/dL — ABNORMAL LOW (ref 6.0–8.5)

## 2024-03-07 ENCOUNTER — Ambulatory Visit (HOSPITAL_COMMUNITY)
Admission: RE | Admit: 2024-03-07 | Discharge: 2024-03-07 | Disposition: A | Discharge status: Home / Self Care | Source: Ambulatory Visit | Attending: Endocrinology | Admitting: Endocrinology

## 2024-03-07 DIAGNOSIS — E1122 Type 2 diabetes mellitus with diabetic chronic kidney disease: Secondary | ICD-10-CM | POA: Diagnosis not present

## 2024-03-07 DIAGNOSIS — Z0389 Encounter for observation for other suspected diseases and conditions ruled out: Secondary | ICD-10-CM | POA: Diagnosis not present

## 2024-03-07 LAB — KAPPA/LAMBDA LIGHT CHAINS
Kappa free light chain: 33.1 mg/L — ABNORMAL HIGH (ref 3.3–19.4)
Kappa, lambda light chain ratio: 1.02 (ref 0.26–1.65)
Lambda free light chains: 32.4 mg/L — ABNORMAL HIGH (ref 5.7–26.3)

## 2024-03-07 MED ORDER — TECHNETIUM TC 99M SULFUR COLLOID
2.0000 | Freq: Once | INTRAVENOUS | Status: AC | PRN
  Administered 2024-03-07: 2.1 via INTRAVENOUS

## 2024-03-08 ENCOUNTER — Ambulatory Visit: Admitting: Pharmacist Clinician (PhC)/ Clinical Pharmacy Specialist

## 2024-03-14 ENCOUNTER — Ambulatory Visit (INDEPENDENT_AMBULATORY_CARE_PROVIDER_SITE_OTHER): Admitting: Gastroenterology

## 2024-03-16 ENCOUNTER — Inpatient Hospital Stay

## 2024-03-16 ENCOUNTER — Inpatient Hospital Stay: Attending: Oncology | Admitting: Oncology

## 2024-03-16 VITALS — BP 117/58 | HR 63 | Temp 97.7°F | Resp 16 | Wt 128.1 lb

## 2024-03-16 DIAGNOSIS — R768 Other specified abnormal immunological findings in serum: Secondary | ICD-10-CM | POA: Insufficient documentation

## 2024-03-16 DIAGNOSIS — N185 Chronic kidney disease, stage 5: Secondary | ICD-10-CM

## 2024-03-16 DIAGNOSIS — N1832 Chronic kidney disease, stage 3b: Secondary | ICD-10-CM | POA: Diagnosis not present

## 2024-03-16 DIAGNOSIS — D631 Anemia in chronic kidney disease: Secondary | ICD-10-CM | POA: Diagnosis not present

## 2024-03-16 DIAGNOSIS — E1122 Type 2 diabetes mellitus with diabetic chronic kidney disease: Secondary | ICD-10-CM | POA: Diagnosis not present

## 2024-03-16 DIAGNOSIS — D649 Anemia, unspecified: Secondary | ICD-10-CM

## 2024-03-16 DIAGNOSIS — R7689 Other specified abnormal immunological findings in serum: Secondary | ICD-10-CM

## 2024-03-16 LAB — HEMOGLOBIN: Hemoglobin: 9.5 g/dL — ABNORMAL LOW (ref 12.0–15.0)

## 2024-03-16 MED ORDER — EPOETIN ALFA-EPBX 10000 UNIT/ML IJ SOLN
10000.0000 [IU] | Freq: Once | INTRAMUSCULAR | Status: AC
  Administered 2024-03-16: 10000 [IU] via SUBCUTANEOUS
  Filled 2024-03-16: qty 1

## 2024-03-16 NOTE — Assessment & Plan Note (Addendum)
 Normocytic anemia likely secondary to chronic kidney disease TSAT within goal of 25-30 SPEP: No M spike, slightly elevated free light chains but normal ratio  -Recent hemoglobin is 10. - Discussed that patient would need EPO shots every 2 weeks for hemoglobin less than 10.  Will recheck CBC today and schedule for every 2 weeks shots.  Discussed risk versus benefits and due to including most common adverse effects like hypertension, stroke and thrombosis. - If the above workup is negative and hemoglobin is less than 10, will start patient on erythropoietin  shots every 2 weeks.  Return to clinic in 2 months to see if there is an improvement, if not we will increase the dose of EPO

## 2024-03-16 NOTE — Patient Instructions (Signed)
 VISIT SUMMARY:  Today, we discussed your anemia and its management. Your recent tests ruled out multiple myeloma and iron deficiency as causes. We determined that your anemia is due to chronic kidney disease. We also talked about a recent episode of vomiting you experienced.  YOUR PLAN:  -ANEMIA DUE TO CHRONIC KIDNEY DISEASE: Your anemia is caused by chronic kidney disease, which affects your kidneys' ability to produce enough red blood cells. We will start you on ESA injections to help increase your hemoglobin levels. There is a small risk of high blood pressure and stroke with this treatment. You will receive an ESA injection today, and then biweekly injections. We will check your hemoglobin levels in two months to see how you are responding. If your levels improve significantly, we will reduce the injections to once a month. If your hemoglobin levels go above 10-11 g/dL, we will stop the injections.  INSTRUCTIONS:  You will receive an ESA injection today, pending insurance approval. Please schedule biweekly ESA injections and a follow-up appointment in two months to re-evaluate your hemoglobin levels.

## 2024-03-16 NOTE — Progress Notes (Signed)
 Pt.'s Hgb 9.5 and blood pressure stable. Patient tolerated Retacrit injection with no complaints voiced.  Site clean and dry with no bruising or swelling noted at site.  See MAR for details.  Band aid applied.  Patient stable during and after injection.  Vss with discharge and left in satisfactory condition with no s/s of distress noted. All follow ups as scheduled.   Hyun Reali

## 2024-03-16 NOTE — Progress Notes (Signed)
 [facility] at [facility]  HEMATOLOGY FOLLOW-UP VISIT  Minus Amel, MD  REASON FOR FOLLOW-UP: Anemia of chronic kidney disease  ASSESSMENT & PLAN:  Patient is a 78 y.o. female following for anemia of chronic kidney disease  Anemia in chronic kidney disease Normocytic anemia likely secondary to chronic kidney disease TSAT within goal of 25-30 SPEP: No M spike, slightly elevated free light chains but normal ratio  -Recent hemoglobin is 10. - Discussed that patient would need EPO shots every 2 weeks for hemoglobin less than 10.  Will recheck CBC today and schedule for every 2 weeks shots.  Discussed risk versus benefits and due to including most common adverse effects like hypertension, stroke and thrombosis. - If the above workup is negative and hemoglobin is less than 10, will start patient on erythropoietin  shots every 2 weeks.  Return to clinic in 2 months to see if there is an improvement, if not we will increase the dose of EPO  Elevated serum immunoglobulin free light chains Likely secondary to chronic kidney disease.  Normal ratio SPEP: No M spike  - No further workup necessary at this time   Orders Placed This Encounter  Procedures   Ferritin    Standing Status:   Future    Expected Date:   05/11/2024    Expiration Date:   03/16/2025   Folate    Standing Status:   Future    Expected Date:   05/11/2024    Expiration Date:   03/16/2025   Vitamin B12    Standing Status:   Future    Expected Date:   05/11/2024    Expiration Date:   03/16/2025   CBC with Differential/Platelet    Standing Status:   Future    Expected Date:   05/11/2024    Expiration Date:   03/16/2025   Comprehensive metabolic panel with GFR    Standing Status:   Future    Expected Date:   05/11/2024    Expiration Date:   03/16/2025   Iron and TIBC    Standing Status:   Future    Expected Date:   05/11/2024    Expiration Date:   03/16/2025    The total time spent in the appointment  was 20 minutes encounter with patients including review of chart and various tests results, discussions about plan of care and coordination of care plan   All questions were answered. The patient knows to call the clinic with any problems, questions or concerns. No barriers to learning was detected.  Eduardo Grade, MD 6/5/20253:07 PM   SUMMARY OF HEMATOLOGIC HISTORY: Anemia of chronic kidney disease -SPEP: No M spike - Slightly elevated free light chains with normal ratio  INTERVAL HISTORY: [name] 78 y.o. female following for anemia of chronic kidney disease.  Patient reports some fatigue.  Is at baseline and has no new complaints today.  I have reviewed the past medical history, past surgical history, social history and family history with the patient   ALLERGIES:  has no known allergies.  MEDICATIONS:  Current Outpatient Medications  Medication Sig Dispense Refill   ACCU-CHEK AVIVA PLUS test strip 1 each by Other route 4 (four) times daily.   5   ACCU-CHEK FASTCLIX LANCETS MISC U TO TEST QID  10   amLODipine  (NORVASC ) 10 MG tablet Take 10 mg by mouth daily.     aspirin  81 MG chewable tablet Chew 1 tablet (81 mg total) by mouth daily.  cloNIDine  (CATAPRES ) 0.2 MG tablet Take 1 tablet (0.2 mg total) by mouth 2 (two) times daily. 180 tablet 3   escitalopram  (LEXAPRO ) 10 MG tablet Take 10 mg by mouth daily.   1   fesoterodine  (TOVIAZ ) 8 MG TB24 tablet Take 1 tablet (8 mg total) by mouth daily. 30 tablet 11   JANUMET 50-1000 MG tablet Take 1 tablet by mouth 2 (two) times daily.     Lancets Misc. (ACCU-CHEK FASTCLIX LANCET) KIT See admin instructions.     levothyroxine  (SYNTHROID , LEVOTHROID) 100 MCG tablet Take 100 mcg by mouth daily.   3   metoprolol tartrate (LOPRESSOR) 50 MG tablet Take 50 mg by mouth 2 (two) times daily.     olmesartan (BENICAR) 40 MG tablet Take 40 mg by mouth daily.     pantoprazole  (PROTONIX ) 40 MG tablet Take 1 tablet (40 mg total) by mouth daily at 6  (six) AM. 30 tablet 1   PREVIDENT 5000 DRY MOUTH 1.1 % GEL dental gel Place 1 Application onto teeth at bedtime.     simvastatin  (ZOCOR ) 40 MG tablet Take 40 mg by mouth daily.   3   donepezil (ARICEPT) 5 MG tablet Take 5 mg by mouth. (Patient not taking: Reported on 03/16/2024)     No current facility-administered medications for this visit.     REVIEW OF SYSTEMS:   Constitutional: Denies fevers, chills or night sweats Eyes: Denies blurriness of vision Ears, nose, mouth, throat, and face: Denies mucositis or sore throat Respiratory: Denies cough, dyspnea or wheezes Cardiovascular: Denies palpitation, chest discomfort or lower extremity swelling Gastrointestinal:  Denies nausea, heartburn or change in bowel habits Skin: Denies abnormal skin rashes Lymphatics: Denies new lymphadenopathy or easy bruising Neurological:Denies numbness, tingling or new weaknesses Behavioral/Psych: Mood is stable, no new changes  All other systems were reviewed with the patient and are negative.  PHYSICAL EXAMINATION:   Vitals:   03/16/24 1304  BP: (!) 117/58  Pulse: 63  Resp: 16  Temp: 97.7 F (36.5 C)  SpO2: 100%    GENERAL:alert, no distress and comfortable SKIN: skin color, texture, turgor are normal, no rashes or significant lesions LUNGS: clear to auscultation and percussion with normal breathing effort HEART: regular rate & rhythm and no murmurs and no lower extremity edema ABDOMEN:abdomen soft, non-tender and normal bowel sounds Musculoskeletal:no cyanosis of digits and no clubbing  NEURO: alert & oriented x 3 with fluent speech  LABORATORY DATA:  I have reviewed the data as listed  Lab Results  Component Value Date   WBC 9.8 03/03/2024   NEUTROABS 6.1 03/03/2024   HGB 9.5 (L) 03/16/2024   HCT 29.0 (L) 03/03/2024   MCV 92.9 03/03/2024   PLT 277 03/03/2024      Chemistry      Component Value Date/Time   NA 131 (L) 03/03/2024 1155   NA 138 11/16/2023 1536   K 4.2 03/03/2024  1155   CL 95 (L) 03/03/2024 1155   CO2 24 03/03/2024 1155   BUN 23 03/03/2024 1155   BUN 13 11/16/2023 1536   CREATININE 1.76 (H) 03/03/2024 1155      Component Value Date/Time   CALCIUM 8.8 (L) 03/03/2024 1155   ALKPHOS 31 (L) 03/03/2024 1155   AST 17 03/03/2024 1155   ALT 11 03/03/2024 1155   BILITOT 0.4 03/03/2024 1155   BILITOT 0.4 11/16/2023 1536      Latest Reference Range & Units 02/23/24 15:46 03/03/24 11:55  Iron 27 - 139 ug/dL 88  UIBC 118 - 369 ug/dL 295   TIBC 621 - 308 ug/dL 657   Ferritin 15 - 846 ng/mL 55   Iron Saturation 15 - 55 % 32   Folate >3.0 ng/mL 15.1   Copper  80 - 158 ug/dL 81   Vitamin B12 232 - 1,245 pg/mL 357   Total Protein ELP 6.0 - 8.5 g/dL  5.7 (L) (C)  Albumin SerPl Elph-Mcnc 2.9 - 4.4 g/dL  3.5 (C)  Albumin/Glob SerPl 0.7 - 1.7   1.6 (C)  Alpha2 Glob SerPl Elph-Mcnc 0.4 - 1.0 g/dL  0.7 (C)  Alpha 1 0.0 - 0.4 g/dL  0.2 (C)  Gamma Glob SerPl Elph-Mcnc 0.4 - 1.8 g/dL  0.5 (C)  M Protein SerPl Elph-Mcnc Not Observed g/dL  Not Observed (C)  IFE 1   Comment (C)  Globulin, Total 2.2 - 3.9 g/dL  2.2 (C)  B-Globulin SerPl Elph-Mcnc 0.7 - 1.3 g/dL  0.8 (C)  IgG (Immunoglobin G), Serum 586 - 1,602 mg/dL  962 (L)  IgM (Immunoglobulin M), Srm 26 - 217 mg/dL  22 (L)  IgA 64 - 952 mg/dL  841  (L): Data is abnormally low (C): Corrected IFE 1 Comment VC   Comment: (NOTE) The immunofixation pattern appears unremarkable. Evidence of monoclonal protein is not apparent.     Latest Reference Range & Units 03/03/24 11:55  Kappa free light chain 3.3 - 19.4 mg/L 33.1 (H)  Lambda free light chains 5.7 - 26.3 mg/L 32.4 (H)  Kappa, lambda light chain ratio 0.26 - 1.65  1.02  (H): Data is abnormally high   RADIOGRAPHIC STUDIES: I have personally reviewed the radiological images as listed and agreed with the findings in the report.  None to review

## 2024-03-16 NOTE — Assessment & Plan Note (Signed)
 Likely secondary to chronic kidney disease.  Normal ratio SPEP: No M spike  - No further workup necessary at this time

## 2024-03-27 ENCOUNTER — Inpatient Hospital Stay: Admitting: Dietician

## 2024-03-30 ENCOUNTER — Inpatient Hospital Stay

## 2024-03-30 DIAGNOSIS — E1122 Type 2 diabetes mellitus with diabetic chronic kidney disease: Secondary | ICD-10-CM | POA: Diagnosis not present

## 2024-03-30 DIAGNOSIS — D631 Anemia in chronic kidney disease: Secondary | ICD-10-CM | POA: Diagnosis not present

## 2024-03-30 DIAGNOSIS — N1832 Chronic kidney disease, stage 3b: Secondary | ICD-10-CM | POA: Diagnosis not present

## 2024-03-30 DIAGNOSIS — D649 Anemia, unspecified: Secondary | ICD-10-CM

## 2024-03-30 LAB — HEMOGLOBIN: Hemoglobin: 10.3 g/dL — ABNORMAL LOW (ref 12.0–15.0)

## 2024-03-30 NOTE — Progress Notes (Signed)
 Patient presents today for Retacrit  injection per provider's orders. Patient's hemoglobin noted to be 10.3. Per Dr.Kandala's note patient will only receive injection if hemoglobin is less than 10. Patient made aware and verbalized understanding.

## 2024-04-13 ENCOUNTER — Inpatient Hospital Stay: Attending: Hematology

## 2024-04-13 ENCOUNTER — Inpatient Hospital Stay

## 2024-04-13 VITALS — BP 178/66 | HR 63 | Temp 97.6°F | Resp 18

## 2024-04-13 DIAGNOSIS — D649 Anemia, unspecified: Secondary | ICD-10-CM

## 2024-04-13 DIAGNOSIS — D631 Anemia in chronic kidney disease: Secondary | ICD-10-CM | POA: Insufficient documentation

## 2024-04-13 DIAGNOSIS — N1832 Chronic kidney disease, stage 3b: Secondary | ICD-10-CM | POA: Diagnosis not present

## 2024-04-13 DIAGNOSIS — E1122 Type 2 diabetes mellitus with diabetic chronic kidney disease: Secondary | ICD-10-CM | POA: Insufficient documentation

## 2024-04-13 LAB — HEMOGLOBIN: Hemoglobin: 10.5 g/dL — ABNORMAL LOW (ref 12.0–15.0)

## 2024-04-13 MED ORDER — EPOETIN ALFA-EPBX 10000 UNIT/ML IJ SOLN
10000.0000 [IU] | Freq: Once | INTRAMUSCULAR | Status: AC
  Administered 2024-04-13: 10000 [IU] via SUBCUTANEOUS
  Filled 2024-04-13: qty 1

## 2024-04-13 MED ORDER — CLONIDINE HCL 0.1 MG PO TABS
0.2000 mg | ORAL_TABLET | Freq: Once | ORAL | Status: AC
  Administered 2024-04-13: 0.2 mg via ORAL
  Filled 2024-04-13: qty 2

## 2024-04-13 NOTE — Progress Notes (Signed)
 Patient's Hgb 10.5  On arrival patients BP 208/79, pt is asymptomatic and has no complaints. Per pt she did not take her morning medications before she came in for her appt. MD and treatment team made aware. Waited 15 minutes, recheck BP 222/82. Pt asymptomatic. MD aware. Per MD to give patient Clonidine  0.2 mg PO at this time and wait about 15-20 minutes to recheck BP. See MAR for administration times. BP recheck 178/66, per pt she still feels fine and has no complaints. Retacrit  injection given per orders.   Patient tolerated injection with no complaints voiced.  Site clean and dry with no bruising or swelling noted at site.  See MAR for details.  Band aid applied.  Patient stable during and after injection.  Vss with discharge and left in satisfactory condition with no s/s of distress noted. All follow ups as scheduled.   Earlin Sweeden

## 2024-04-13 NOTE — Patient Instructions (Signed)

## 2024-04-26 ENCOUNTER — Encounter: Payer: Self-pay | Admitting: Oncology

## 2024-04-26 ENCOUNTER — Other Ambulatory Visit (HOSPITAL_COMMUNITY): Payer: Self-pay

## 2024-04-26 ENCOUNTER — Ambulatory Visit: Attending: Cardiology | Admitting: Pharmacist Clinician (PhC)/ Clinical Pharmacy Specialist

## 2024-04-26 ENCOUNTER — Encounter: Payer: Self-pay | Admitting: Pharmacist Clinician (PhC)/ Clinical Pharmacy Specialist

## 2024-04-26 ENCOUNTER — Other Ambulatory Visit (HOSPITAL_BASED_OUTPATIENT_CLINIC_OR_DEPARTMENT_OTHER): Payer: Self-pay

## 2024-04-26 VITALS — BP 136/64 | HR 56

## 2024-04-26 DIAGNOSIS — I10 Essential (primary) hypertension: Secondary | ICD-10-CM | POA: Insufficient documentation

## 2024-04-26 MED ORDER — CARVEDILOL 12.5 MG PO TABS
12.5000 mg | ORAL_TABLET | Freq: Two times a day (BID) | ORAL | 3 refills | Status: DC
  Filled 2024-04-26 – 2024-07-18 (×4): qty 180, 90d supply, fill #0

## 2024-04-26 NOTE — Patient Instructions (Signed)
 Follow up appointment: Thursday September 11 at 11 am at the [facility] office  Take your BP meds as follows:  STOP METOPROLOL   START CARVEDILOL  12.5 MG TWICE DAILY   Check your blood pressure at home daily and keep record of the readings.  Your blood pressure goal is < 130/80  To check your pressure at home you will need to:  1. Sit up in a chair, with feet flat on the floor and back supported. Do not cross your ankles or legs. 2. Rest your left arm so that the cuff is about heart level. If the cuff goes on your upper arm,  then just relax the arm on the table, arm of the chair or your lap. If you have a wrist cuff, we  suggest relaxing your wrist against your chest (think of it as Pledging the Flag with the  wrong arm).  3. Place the cuff snugly around your arm, about 1 inch above the crook of your elbow. The  cords should be inside the groove of your elbow.  4. Sit quietly, with the cuff in place, for about 5 minutes. After that 5 minutes press the power  button to start a reading. 5. Do not talk or move while the reading is taking place.  6. Record your readings on a sheet of paper. Although most cuffs have a memory, it is often  easier to see a pattern developing when the numbers are all in front of you.  7. You can repeat the reading after 1-3 minutes if it is recommended  Make sure your bladder is empty and you have not had caffeine or tobacco within the last 30 min  Always bring your blood pressure log with you to your appointments. If you have not brought your monitor in to be double checked for accuracy, please bring it to your next appointment.  You can find a list of quality blood pressure cuffs at [email or web address]  Important lifestyle changes to control high blood pressure  Intervention  Effect on the BP  Lose extra pounds and watch your waistline Weight loss is one of the most effective lifestyle changes for controlling blood pressure. If you're overweight or obese,  losing even a small amount of weight can help reduce blood pressure. Blood pressure might go down by about 1 millimeter of mercury (mm Hg) with each kilogram (about 2.2 pounds) of weight lost.  Exercise regularly As a general goal, aim for at least 30 minutes of moderate physical activity every day. Regular physical activity can lower high blood pressure by about 5 to 8 mm Hg.  Eat a healthy diet Eating a diet rich in whole grains, fruits, vegetables, and low-fat dairy products and low in saturated fat and cholesterol. A healthy diet can lower high blood pressure by up to 11 mm Hg.  Reduce salt (sodium) in your diet Even a small reduction of sodium in the diet can improve heart health and reduce high blood pressure by about 5 to 6 mm Hg.  Limit alcohol One drink equals 12 ounces of beer, 5 ounces of wine, or 1.5 ounces of 80-proof liquor.  Limiting alcohol to less than one drink a day for women or two drinks a day for men can help lower blood pressure by about 4 mm Hg.   If you have any questions or concerns please use My Chart to send questions or call the office at [phone]

## 2024-04-26 NOTE — Assessment & Plan Note (Signed)
 Assessment: BP is uncontrolled in office BP 136/64 mmHg;  above the goal (<130/80). Home readings averaging much higher, cuff not validated (wrist model) Tolerates current medications well, without any side effects Denies SOB, palpitation, chest pain, headaches,or swelling Reiterated the importance of regular exercise and low salt diet   Plan:  Stop taking metoprolol  50 mg bid Start taking carvedilol  12.5 mg bid Continue taking amlodipine  10 mg daily, olmesartan  10 mg daily, clonidine  0.2 mg bid Patient to keep record of BP readings with heart rate and report to us  at the next visit Patient to follow up with me in 2 months at [facility] location  Labs ordered today:  none

## 2024-04-26 NOTE — Progress Notes (Signed)
 Office Visit    Patient Name: [name] Date of Encounter: 04/26/2024  Primary Care Provider:  Marvine Rush, MD Primary Cardiologist:  None  Chief Complaint    Hypertension  Significant Past Medical History   HLD LDL on simvastatin  40 mg  DM2 12/24 A1c 7.1 on Janumet     No Known Allergies  History of Present Illness    [name] is a 78 y.o. female patient of Dr Lavona, in the office today for hypertension evaluation.   Dr. Lavona last saw her in March, at which time her BP was 160/70.   She had an abdominal CT that showed normal adrenal glands and some calcified plaque in the left renal artery, with mild stenosis.  He increased her clonidine  to 0.2 mg bid.    Today she returns for follow up.  Has been checking her BP daily at home  Blood Pressure Goal:  130/80  Current Medications:  amlodipine  10 mg daily, metoprolol  tart 50 mg bid, olmesartan  40 mg daily, clonidine  0.2 mg bid  Social Hx:      Tobacco: no  Alcohol: no  Caffeine: diet Dr. Nunzio 16 oz per day, occasionally 2  Diet:    doesn't eat much meat, will eat occasionally beef, fish or chicken - often fried; some vegetables and beans; snacks on ice cream  Exercise: no  Home BP readings:  wrist cuff several years old, not at heart level; cuff has not been validated  Average of last 14 days is 160/90  HR 59  Adherence Assessment  Do you ever forget to take your medication? [x] Yes - occasionally [] No  Do you ever skip doses due to side effects? [] Yes [x] No  Do you have trouble affording your medicines? [] Yes [x] No  Are you ever unable to pick up your medication due to transportation difficulties? [] Yes [x] No - but frustrated with repeated calls from pharmacy    Accessory Clinical Findings    Lab Results  Component Value Date   CREATININE 1.76 (H) 03/03/2024   BUN 23 03/03/2024   NA 131 (L) 03/03/2024   K 4.2 03/03/2024   CL 95 (L) 03/03/2024   CO2 24 03/03/2024   Lab Results  Component  Value Date   ALT 11 03/03/2024   AST 17 03/03/2024   ALKPHOS 31 (L) 03/03/2024   BILITOT 0.4 03/03/2024   Lab Results  Component Value Date   HGBA1C 7.1 (H) 10/12/2023    Home Medications    Current Outpatient Medications  Medication Sig Dispense Refill   carvedilol  (COREG ) 12.5 MG tablet Take 1 tablet (12.5 mg total) by mouth 2 (two) times daily. 180 tablet 3   ACCU-CHEK AVIVA PLUS test strip 1 each by Other route 4 (four) times daily.   5   ACCU-CHEK FASTCLIX LANCETS MISC U TO TEST QID  10   amLODipine  (NORVASC ) 10 MG tablet Take 10 mg by mouth daily.     aspirin  81 MG chewable tablet Chew 1 tablet (81 mg total) by mouth daily.     cloNIDine  (CATAPRES ) 0.2 MG tablet Take 1 tablet (0.2 mg total) by mouth 2 (two) times daily. 180 tablet 3   donepezil  (ARICEPT ) 5 MG tablet Take 5 mg by mouth. (Patient not taking: Reported on 03/16/2024)     escitalopram  (LEXAPRO ) 10 MG tablet Take 10 mg by mouth daily.   1   fesoterodine  (TOVIAZ ) 8 MG TB24 tablet Take 1 tablet (8 mg total) by mouth daily. 30 tablet 11  JANUMET  50-1000 MG tablet Take 1 tablet by mouth 2 (two) times daily.     Lancets Misc. (ACCU-CHEK FASTCLIX LANCET) KIT See admin instructions.     levothyroxine  (SYNTHROID , LEVOTHROID) 100 MCG tablet Take 100 mcg by mouth daily.   3   olmesartan  (BENICAR ) 40 MG tablet Take 40 mg by mouth daily.     pantoprazole  (PROTONIX ) 40 MG tablet Take 1 tablet (40 mg total) by mouth daily at 6 (six) AM. 30 tablet 1   PREVIDENT 5000 DRY MOUTH 1.1 % GEL dental gel Place 1 Application onto teeth at bedtime.     simvastatin  (ZOCOR ) 40 MG tablet Take 40 mg by mouth daily.   3   No current facility-administered medications for this visit.         Assessment & Plan    Uncontrolled hypertension Assessment: BP is uncontrolled in office BP 136/64 mmHg;  above the goal (<130/80). Home readings averaging much higher, cuff not validated (wrist model) Tolerates current medications well, without any  side effects Denies SOB, palpitation, chest pain, headaches,or swelling Reiterated the importance of regular exercise and low salt diet   Plan:  Stop taking metoprolol  50 mg bid Start taking carvedilol  12.5 mg bid Continue taking amlodipine  10 mg daily, olmesartan  10 mg daily, clonidine  0.2 mg bid Patient to keep record of BP readings with heart rate and report to us  at the next visit Patient to follow up with me in 2 months at [facility] location  Labs ordered today:  none   Allean Mink PharmD CPP [facility]  [address] floor [place], [place] [address] [phone]

## 2024-04-27 ENCOUNTER — Inpatient Hospital Stay

## 2024-04-27 ENCOUNTER — Encounter: Payer: Self-pay | Admitting: Oncology

## 2024-04-27 ENCOUNTER — Other Ambulatory Visit: Payer: Self-pay

## 2024-04-27 ENCOUNTER — Other Ambulatory Visit (HOSPITAL_BASED_OUTPATIENT_CLINIC_OR_DEPARTMENT_OTHER): Payer: Self-pay

## 2024-04-27 VITALS — BP 124/56 | HR 58 | Temp 97.6°F | Resp 17

## 2024-04-27 DIAGNOSIS — D649 Anemia, unspecified: Secondary | ICD-10-CM

## 2024-04-27 DIAGNOSIS — N1832 Chronic kidney disease, stage 3b: Secondary | ICD-10-CM | POA: Diagnosis not present

## 2024-04-27 DIAGNOSIS — D631 Anemia in chronic kidney disease: Secondary | ICD-10-CM | POA: Diagnosis not present

## 2024-04-27 DIAGNOSIS — E1122 Type 2 diabetes mellitus with diabetic chronic kidney disease: Secondary | ICD-10-CM | POA: Diagnosis not present

## 2024-04-27 LAB — HEMOGLOBIN: Hemoglobin: 9.6 g/dL — ABNORMAL LOW (ref 12.0–15.0)

## 2024-04-27 MED ORDER — HYDROCHLOROTHIAZIDE 25 MG PO TABS
25.0000 mg | ORAL_TABLET | Freq: Every day | ORAL | 2 refills | Status: DC
  Filled 2024-05-16: qty 90, 90d supply, fill #0

## 2024-04-27 MED ORDER — SITAGLIPTIN PHOS-METFORMIN HCL 50-1000 MG PO TABS
1.0000 | ORAL_TABLET | Freq: Two times a day (BID) | ORAL | 2 refills | Status: DC
  Filled 2024-05-10: qty 180, 90d supply, fill #0

## 2024-04-27 MED ORDER — OLMESARTAN MEDOXOMIL 40 MG PO TABS
40.0000 mg | ORAL_TABLET | Freq: Every day | ORAL | 3 refills | Status: DC
  Filled 2024-04-27: qty 90, 90d supply, fill #0

## 2024-04-27 MED ORDER — FERROUS SULFATE 325 (65 FE) MG PO TBEC
325.0000 mg | DELAYED_RELEASE_TABLET | ORAL | 0 refills | Status: DC
  Filled 2024-04-27 – 2024-05-10 (×2): qty 15, 30d supply, fill #0

## 2024-04-27 MED ORDER — SITAGLIPTIN PHOS-METFORMIN HCL 50-1000 MG PO TABS
1.0000 | ORAL_TABLET | Freq: Two times a day (BID) | ORAL | 2 refills | Status: DC
  Filled 2024-04-27 – 2024-05-16 (×2): qty 180, 90d supply, fill #0

## 2024-04-27 MED ORDER — OLMESARTAN MEDOXOMIL 40 MG PO TABS: 40.0000 mg | ORAL_TABLET | Freq: Every day | ORAL | 3 refills | Status: DC

## 2024-04-27 MED ORDER — POLYETHYLENE GLYCOL 3350 17 G PO PACK
17.0000 g | PACK | Freq: Two times a day (BID) | ORAL | 0 refills | Status: DC
  Filled 2024-04-27: qty 150, 75d supply, fill #0
  Filled 2024-05-10 – 2024-05-16 (×2): qty 38, 19d supply, fill #0
  Filled 2024-05-29: qty 60, 30d supply, fill #0

## 2024-04-27 MED ORDER — DONEPEZIL HCL 5 MG PO TABS
5.0000 mg | ORAL_TABLET | Freq: Every day | ORAL | 1 refills | Status: AC
  Filled 2024-04-27: qty 90, 90d supply, fill #0

## 2024-04-27 MED ORDER — METOPROLOL TARTRATE 50 MG PO TABS: 50.0000 mg | ORAL_TABLET | Freq: Two times a day (BID) | ORAL | 3 refills | Status: DC

## 2024-04-27 MED ORDER — SODIUM FLUORIDE 1.1 % DT PSTE
PASTE | DENTAL | 12 refills | Status: DC
  Filled 2024-04-27 – 2024-06-07 (×4): qty 100, 30d supply, fill #0

## 2024-04-27 MED ORDER — ACCU-CHEK FASTCLIX LANCETS MISC
11 refills | Status: AC
  Filled 2024-04-27 – 2024-06-07 (×4): qty 102, 34d supply, fill #0
  Filled 2024-07-10 – 2024-07-26 (×4): qty 102, 34d supply, fill #1

## 2024-04-27 MED ORDER — AMLODIPINE BESYLATE 10 MG PO TABS
10.0000 mg | ORAL_TABLET | Freq: Every day | ORAL | 3 refills | Status: DC
  Filled 2024-05-16 – 2024-07-04 (×4): qty 90, 90d supply, fill #0

## 2024-04-27 MED ORDER — EPOETIN ALFA-EPBX 10000 UNIT/ML IJ SOLN
10000.0000 [IU] | Freq: Once | INTRAMUSCULAR | Status: AC
  Administered 2024-04-27: 10000 [IU] via SUBCUTANEOUS
  Filled 2024-04-27: qty 1

## 2024-04-27 MED ORDER — AMLODIPINE BESYLATE 5 MG PO TABS
5.0000 mg | ORAL_TABLET | Freq: Every day | ORAL | 3 refills | Status: DC
  Filled 2024-04-27 – 2024-08-03 (×6): qty 90, 90d supply, fill #0

## 2024-04-27 MED ORDER — GLUCOSE BLOOD VI STRP
ORAL_STRIP | 3 refills | Status: AC
  Filled 2024-04-27: qty 100, 25d supply, fill #0

## 2024-04-27 MED ORDER — PROMETHAZINE HCL 25 MG PO TABS
25.0000 mg | ORAL_TABLET | Freq: Four times a day (QID) | ORAL | 0 refills | Status: DC
  Filled 2024-04-27: qty 30, 8d supply, fill #0

## 2024-04-27 MED ORDER — SIMVASTATIN 40 MG PO TABS
40.0000 mg | ORAL_TABLET | Freq: Every day | ORAL | 2 refills | Status: DC
  Filled 2024-04-27: qty 90, 90d supply, fill #0

## 2024-04-27 NOTE — Patient Instructions (Signed)
 [facility] [place] - A DEPT OF [facility]  Discharge Instructions: Thank you for choosing [facility] to provide your oncology and hematology care.  If you have a lab appointment with the Cancer Center - please note that after April 8th, 2024, all labs will be drawn in the cancer center.  You do not have to check in or register with the main entrance as you have in the past but will complete your check-in in the cancer center.  Wear comfortable clothing and clothing appropriate for easy access to any Portacath or PICC line.   We strive to give you quality time with your provider. You may need to reschedule your appointment if you arrive late (15 or more minutes).  Arriving late affects you and other patients whose appointments are after yours.  Also, if you miss three or more appointments without notifying the office, you may be dismissed from the clinic at the provider's discretion.      For prescription refill requests, have your pharmacy contact our office and allow 72 hours for refills to be completed.    Today you received the following :  Retacrit.  Epoetin Alfa Injection What is this medication? EPOETIN ALFA (e POE e tin AL fa) treats low levels of red blood cells (anemia) caused by kidney disease, chemotherapy, or HIV medications. It can also be used in people who are at risk for blood loss during surgery. It works by Systems analyst make more red blood cells, which reduces the need for blood transfusions. This medicine may be used for other purposes; ask your health care provider or pharmacist if you have questions. COMMON BRAND NAME(S): Epogen, Procrit, Retacrit What should I tell my care team before I take this medication? They need to know if you have any of these conditions: Blood clots Cancer Heart disease High blood pressure On dialysis Seizures Stroke An unusual or allergic reaction to epoetin alfa, albumin, benzyl alcohol, other  medications, foods, dyes, or preservatives Pregnant or trying to get pregnant Breast-feeding How should I use this medication? This medication is injected into a vein or under the skin. It is usually given by your care team in a hospital or clinic setting. It may also be given at home. If you get this medication at home, you will be taught how to prepare and give it. Use exactly as directed. Take it as directed on the prescription label at the same time every day. Keep taking it unless your care team tells you to stop. It is important that you put your used needles and syringes in a special sharps container. Do not put them in a trash can. If you do not have a sharps container, call your pharmacist or care team to get one. A special MedGuide will be given to you by the pharmacist with each prescription and refill. Be sure to read this information carefully each time. Talk to your care team about the use of this medication in children. While this medication may be used in children as young as 1 month of age for selected conditions, precautions do apply. Overdosage: If you think you have taken too much of this medicine contact a poison control center or emergency room at once. NOTE: This medicine is only for you. Do not share this medicine with others. What if I miss a dose? If you miss a dose, take it as soon as you can. If it is almost time for your  next dose, take only that dose. Do not take double or extra doses. What may interact with this medication? Darbepoetin alfa Methoxy polyethylene glycol-epoetin beta This list may not describe all possible interactions. Give your health care provider a list of all the medicines, herbs, non-prescription drugs, or dietary supplements you use. Also tell them if you smoke, drink alcohol, or use illegal drugs. Some items may interact with your medicine. What should I watch for while using this medication? Visit your care team for regular checks on your  progress. Check your blood pressure as directed. Know what your blood pressure should be and when to contact your care team. Your condition will be monitored carefully while you are receiving this medication. You may need blood work while taking this medication. What side effects may I notice from receiving this medication? Side effects that you should report to your care team as soon as possible: Allergic reactions--skin rash, itching, hives, swelling of the face, lips, tongue, or throat Blood clot--pain, swelling, or warmth in the leg, shortness of breath, chest pain Heart attack--pain or tightness in the chest, shoulders, arms, or jaw, nausea, shortness of breath, cold or clammy skin, feeling faint or lightheaded Increase in blood pressure Rash, fever, and swollen lymph nodes Redness, blistering, peeling, or loosening of the skin, including inside the mouth Seizures Stroke--sudden numbness or weakness of the face, arm, or leg, trouble speaking, confusion, trouble walking, loss of balance or coordination, dizziness, severe headache, change in vision Side effects that usually do not require medical attention (report to your care team if they continue or are bothersome): Bone, joint, or muscle pain Cough Headache Nausea Pain, redness, or irritation at injection site This list may not describe all possible side effects. Call your doctor for medical advice about side effects. You may report side effects to FDA at 1-800-FDA-1088. Where should I keep my medication? Keep out of the reach of children and pets. Store in a refrigerator. Do not freeze. Do not shake. Protect from light. Keep this medication in the original container until you are ready to take it. See product for storage information. Get rid of any unused medication after the expiration date. To get rid of medications that are no longer needed or have expired: Take the medication to a medication take-back program. Check with your  pharmacy or law enforcement to find a location. If you cannot return the medication, ask your pharmacist or care team how to get rid of the medication safely. NOTE: This sheet is a summary. It may not cover all possible information. If you have questions about this medicine, talk to your doctor, pharmacist, or health care provider.  2024 Elsevier/Gold Standard (2022-01-30 00:00:00)    To help prevent nausea and vomiting after your treatment, we encourage you to take your nausea medication as directed.  BELOW ARE SYMPTOMS THAT SHOULD BE REPORTED IMMEDIATELY: *FEVER GREATER THAN 100.4 F (38 C) OR HIGHER *CHILLS OR SWEATING *NAUSEA AND VOMITING THAT IS NOT CONTROLLED WITH YOUR NAUSEA MEDICATION *UNUSUAL SHORTNESS OF BREATH *UNUSUAL BRUISING OR BLEEDING *URINARY PROBLEMS (pain or burning when urinating, or frequent urination) *BOWEL PROBLEMS (unusual diarrhea, constipation, pain near the anus) TENDERNESS IN MOUTH AND THROAT WITH OR WITHOUT PRESENCE OF ULCERS (sore throat, sores in mouth, or a toothache) UNUSUAL RASH, SWELLING OR PAIN  UNUSUAL VAGINAL DISCHARGE OR ITCHING   Items with * indicate a potential emergency and should be followed up as soon as possible or go to the Emergency Department if any problems should  occur.  Please show the CHEMOTHERAPY ALERT CARD or IMMUNOTHERAPY ALERT CARD at check-in to the Emergency Department and triage nurse.  Should you have questions after your visit or need to cancel or reschedule your appointment, please contact [facility] [place] - A DEPT OF [facility] [phone]  and follow the prompts.  Office hours are 8:00 a.m. to 4:30 p.m. Monday - Friday. Please note that voicemails left after 4:00 p.m. may not be returned until the following business day.  We are closed weekends and major holidays. You have access to a nurse at all times for urgent questions. Please call the main number to the clinic [phone] and follow the  prompts.  For any non-urgent questions, you may also contact your provider using MyChart. We now offer e-Visits for anyone 73 and older to request care online for non-urgent symptoms. For details visit [email or web address].   Also download the MyChart app! Go to the app store, search "MyChart", open the app, select [place], and log in with your MyChart username and password.

## 2024-04-27 NOTE — Progress Notes (Signed)
 Hemoglobin today is 9.6.  We will proceed with Retacrit  injection per orders by Dr. Davonna.

## 2024-05-03 ENCOUNTER — Telehealth: Payer: Self-pay | Admitting: Gastroenterology

## 2024-05-03 NOTE — Telephone Encounter (Signed)
 Procedure:Upper EUS Procedure date: 05/11/24 Procedure location: [facility] Arrival Time: 7:30 am Spoke with the patient Y/N:   Yes called [phone] and no answer, pt states that number is messed up. Called [phone] and talked to the patient.   Any prep concerns? No  Has the patient obtained the prep from the pharmacy ? No prep needed Do you have a care partner and transportation: Yes Any additional concerns? No

## 2024-05-04 ENCOUNTER — Encounter (HOSPITAL_COMMUNITY): Payer: Self-pay | Admitting: Gastroenterology

## 2024-05-08 ENCOUNTER — Other Ambulatory Visit (HOSPITAL_BASED_OUTPATIENT_CLINIC_OR_DEPARTMENT_OTHER): Payer: Self-pay

## 2024-05-09 ENCOUNTER — Other Ambulatory Visit (HOSPITAL_COMMUNITY): Payer: Self-pay

## 2024-05-09 ENCOUNTER — Other Ambulatory Visit (HOSPITAL_BASED_OUTPATIENT_CLINIC_OR_DEPARTMENT_OTHER): Payer: Self-pay

## 2024-05-10 ENCOUNTER — Encounter: Payer: Self-pay | Admitting: Oncology

## 2024-05-10 ENCOUNTER — Inpatient Hospital Stay

## 2024-05-10 ENCOUNTER — Other Ambulatory Visit (HOSPITAL_BASED_OUTPATIENT_CLINIC_OR_DEPARTMENT_OTHER): Payer: Self-pay

## 2024-05-10 VITALS — BP 158/68 | HR 71 | Temp 97.9°F | Resp 18

## 2024-05-10 DIAGNOSIS — N1832 Chronic kidney disease, stage 3b: Secondary | ICD-10-CM | POA: Diagnosis not present

## 2024-05-10 DIAGNOSIS — D631 Anemia in chronic kidney disease: Secondary | ICD-10-CM | POA: Diagnosis not present

## 2024-05-10 DIAGNOSIS — E1122 Type 2 diabetes mellitus with diabetic chronic kidney disease: Secondary | ICD-10-CM | POA: Diagnosis not present

## 2024-05-10 DIAGNOSIS — D649 Anemia, unspecified: Secondary | ICD-10-CM

## 2024-05-10 LAB — CBC WITH DIFFERENTIAL/PLATELET
Abs Immature Granulocytes: 0.04 K/uL (ref 0.00–0.07)
Basophils Absolute: 0.1 K/uL (ref 0.0–0.1)
Basophils Relative: 1 %
Eosinophils Absolute: 0.5 K/uL (ref 0.0–0.5)
Eosinophils Relative: 4 %
HCT: 32.9 % — ABNORMAL LOW (ref 36.0–46.0)
Hemoglobin: 10.4 g/dL — ABNORMAL LOW (ref 12.0–15.0)
Immature Granulocytes: 0 %
Lymphocytes Relative: 21 %
Lymphs Abs: 2.8 K/uL (ref 0.7–4.0)
MCH: 30.7 pg (ref 26.0–34.0)
MCHC: 31.6 g/dL (ref 30.0–36.0)
MCV: 97.1 fL (ref 80.0–100.0)
Monocytes Absolute: 1.1 K/uL — ABNORMAL HIGH (ref 0.1–1.0)
Monocytes Relative: 8 %
Neutro Abs: 8.8 K/uL — ABNORMAL HIGH (ref 1.7–7.7)
Neutrophils Relative %: 66 %
Platelets: 301 K/uL (ref 150–400)
RBC: 3.39 MIL/uL — ABNORMAL LOW (ref 3.87–5.11)
RDW: 13.1 % (ref 11.5–15.5)
WBC: 13.4 K/uL — ABNORMAL HIGH (ref 4.0–10.5)
nRBC: 0 % (ref 0.0–0.2)

## 2024-05-10 LAB — IRON AND TIBC
Iron: 56 ug/dL (ref 28–170)
Saturation Ratios: 15 % (ref 10.4–31.8)
TIBC: 363 ug/dL (ref 250–450)
UIBC: 307 ug/dL

## 2024-05-10 LAB — COMPREHENSIVE METABOLIC PANEL WITH GFR
ALT: 12 U/L (ref 0–44)
AST: 16 U/L (ref 15–41)
Albumin: 3.9 g/dL (ref 3.5–5.0)
Alkaline Phosphatase: 43 U/L (ref 38–126)
Anion gap: 14 (ref 5–15)
BUN: 34 mg/dL — ABNORMAL HIGH (ref 8–23)
CO2: 26 mmol/L (ref 22–32)
Calcium: 9.5 mg/dL (ref 8.9–10.3)
Chloride: 98 mmol/L (ref 98–111)
Creatinine, Ser: 1.94 mg/dL — ABNORMAL HIGH (ref 0.44–1.00)
GFR, Estimated: 26 mL/min — ABNORMAL LOW (ref >60)
Glucose, Bld: 98 mg/dL (ref 70–99)
Potassium: 4.2 mmol/L (ref 3.5–5.1)
Sodium: 138 mmol/L (ref 135–145)
Total Bilirubin: 0.7 mg/dL (ref 0.0–1.2)
Total Protein: 6.6 g/dL (ref 6.5–8.1)

## 2024-05-10 LAB — FOLATE: Folate: 7.4 ng/mL (ref >5.9)

## 2024-05-10 LAB — FERRITIN: Ferritin: 20 ng/mL (ref 11–307)

## 2024-05-10 LAB — VITAMIN B12: Vitamin B-12: 229 pg/mL (ref 180–914)

## 2024-05-10 MED ORDER — EPOETIN ALFA-EPBX 10000 UNIT/ML IJ SOLN
10000.0000 [IU] | Freq: Once | INTRAMUSCULAR | Status: AC
  Administered 2024-05-10: 10000 [IU] via SUBCUTANEOUS
  Filled 2024-05-10: qty 1

## 2024-05-10 MED ORDER — PROMETHAZINE HCL 25 MG PO TABS
25.0000 mg | ORAL_TABLET | Freq: Four times a day (QID) | ORAL | 0 refills | Status: DC
  Filled 2024-05-10: qty 30, 8d supply, fill #0

## 2024-05-10 NOTE — Patient Instructions (Signed)
 [facility] [place] [facility]  Discharge Instructions: Thank you for choosing [facility] to provide your oncology and hematology care.  If you have a lab appointment with the Cancer Center - please note that after April 8th, 2024, all labs will be drawn in the cancer center.  You do not have to check in or register with the main entrance as you have in the past but will complete your check-in in the cancer center.  Wear comfortable clothing and clothing appropriate for easy access to any Portacath or PICC line.   We strive to give you quality time with your provider. You may need to reschedule your appointment if you arrive late (15 or more minutes).  Arriving late affects you and other patients whose appointments are after yours.  Also, if you miss three or more appointments without notifying the office, you may be dismissed from the clinic at the provider's discretion.      For prescription refill requests, have your pharmacy contact our office and allow 72 hours for refills to be completed.    Today you received the following retacrit, return as scheduled.   To help prevent nausea and vomiting after your treatment, we encourage you to take your nausea medication as directed.  BELOW ARE SYMPTOMS THAT SHOULD BE REPORTED IMMEDIATELY: *FEVER GREATER THAN 100.4 F (38 C) OR HIGHER *CHILLS OR SWEATING *NAUSEA AND VOMITING THAT IS NOT CONTROLLED WITH YOUR NAUSEA MEDICATION *UNUSUAL SHORTNESS OF BREATH *UNUSUAL BRUISING OR BLEEDING *URINARY PROBLEMS (pain or burning when urinating, or frequent urination) *BOWEL PROBLEMS (unusual diarrhea, constipation, pain near the anus) TENDERNESS IN MOUTH AND THROAT WITH OR WITHOUT PRESENCE OF ULCERS (sore throat, sores in mouth, or a toothache) UNUSUAL RASH, SWELLING OR PAIN  UNUSUAL VAGINAL DISCHARGE OR ITCHING   Items with * indicate a potential emergency and should be followed up as soon as possible or  go to the Emergency Department if any problems should occur.  Please show the CHEMOTHERAPY ALERT CARD or IMMUNOTHERAPY ALERT CARD at check-in to the Emergency Department and triage nurse.  Should you have questions after your visit or need to cancel or reschedule your appointment, please contact [facility] [place] - A DEPT OF [facility] [phone]  and follow the prompts.  Office hours are 8:00 a.m. to 4:30 p.m. Monday - Friday. Please note that voicemails left after 4:00 p.m. may not be returned until the following business day.  We are closed weekends and major holidays. You have access to a nurse at all times for urgent questions. Please call the main number to the clinic [phone] and follow the prompts.  For any non-urgent questions, you may also contact your provider using MyChart. We now offer e-Visits for anyone 40 and older to request care online for non-urgent symptoms. For details visit [email or web address].   Also download the MyChart app! Go to the app store, search "MyChart", open the app, select [place], and log in with your MyChart username and password.

## 2024-05-10 NOTE — Progress Notes (Signed)
Patient presents today for Retacrit injection. Hemoglobin reviewed prior to administration. VSS tolerated without incident or complaint. See MAR for details. Patient stable during and after injection. Patient discharged in satisfactory condition with no s/s of distress noted.  

## 2024-05-11 ENCOUNTER — Inpatient Hospital Stay

## 2024-05-11 ENCOUNTER — Other Ambulatory Visit (HOSPITAL_BASED_OUTPATIENT_CLINIC_OR_DEPARTMENT_OTHER): Payer: Self-pay

## 2024-05-11 ENCOUNTER — Ambulatory Visit (HOSPITAL_COMMUNITY)
Admission: RE | Admit: 2024-05-11 | Discharge: 2024-05-11 | Disposition: A | Discharge status: Home / Self Care | Attending: Gastroenterology | Admitting: Gastroenterology

## 2024-05-11 ENCOUNTER — Encounter (HOSPITAL_COMMUNITY)
Admission: RE | Disposition: A | Discharge status: Home / Self Care | Payer: Self-pay | Source: Home / Self Care | Attending: Gastroenterology

## 2024-05-11 ENCOUNTER — Ambulatory Visit (HOSPITAL_COMMUNITY)

## 2024-05-11 ENCOUNTER — Other Ambulatory Visit: Payer: Self-pay

## 2024-05-11 ENCOUNTER — Encounter (HOSPITAL_COMMUNITY): Payer: Self-pay | Admitting: Gastroenterology

## 2024-05-11 DIAGNOSIS — I899 Noninfective disorder of lymphatic vessels and lymph nodes, unspecified: Secondary | ICD-10-CM

## 2024-05-11 DIAGNOSIS — K3189 Other diseases of stomach and duodenum: Secondary | ICD-10-CM

## 2024-05-11 DIAGNOSIS — K222 Esophageal obstruction: Secondary | ICD-10-CM | POA: Diagnosis not present

## 2024-05-11 DIAGNOSIS — I10 Essential (primary) hypertension: Secondary | ICD-10-CM | POA: Diagnosis not present

## 2024-05-11 DIAGNOSIS — K449 Diaphragmatic hernia without obstruction or gangrene: Secondary | ICD-10-CM

## 2024-05-11 DIAGNOSIS — N189 Chronic kidney disease, unspecified: Secondary | ICD-10-CM | POA: Diagnosis not present

## 2024-05-11 DIAGNOSIS — E119 Type 2 diabetes mellitus without complications: Secondary | ICD-10-CM | POA: Insufficient documentation

## 2024-05-11 DIAGNOSIS — K2289 Other specified disease of esophagus: Secondary | ICD-10-CM

## 2024-05-11 DIAGNOSIS — R933 Abnormal findings on diagnostic imaging of other parts of digestive tract: Secondary | ICD-10-CM

## 2024-05-11 DIAGNOSIS — N289 Disorder of kidney and ureter, unspecified: Secondary | ICD-10-CM | POA: Insufficient documentation

## 2024-05-11 DIAGNOSIS — I129 Hypertensive chronic kidney disease with stage 1 through stage 4 chronic kidney disease, or unspecified chronic kidney disease: Secondary | ICD-10-CM | POA: Diagnosis not present

## 2024-05-11 DIAGNOSIS — K319 Disease of stomach and duodenum, unspecified: Secondary | ICD-10-CM

## 2024-05-11 HISTORY — PX: ESOPHAGOGASTRODUODENOSCOPY: SHX5428

## 2024-05-11 HISTORY — PX: EUS: SHX5427

## 2024-05-11 LAB — GLUCOSE, CAPILLARY
Glucose-Capillary: 119 mg/dL — ABNORMAL HIGH (ref 70–99)
Glucose-Capillary: 132 mg/dL — ABNORMAL HIGH (ref 70–99)

## 2024-05-11 SURGERY — EGD (ESOPHAGOGASTRODUODENOSCOPY)
Anesthesia: Monitor Anesthesia Care

## 2024-05-11 MED ORDER — LIDOCAINE 2% (20 MG/ML) 5 ML SYRINGE
INTRAMUSCULAR | Status: DC | PRN
  Administered 2024-05-11: 40 mg via INTRAVENOUS

## 2024-05-11 MED ORDER — DEXAMETHASONE SODIUM PHOSPHATE 10 MG/ML IJ SOLN
INTRAMUSCULAR | Status: DC | PRN
  Administered 2024-05-11: 10 mg via INTRAVENOUS

## 2024-05-11 MED ORDER — PROPOFOL 500 MG/50ML IV EMUL
INTRAVENOUS | Status: DC | PRN
  Administered 2024-05-11 (×2): 50 mg via INTRAVENOUS
  Administered 2024-05-11: 30 mg via INTRAVENOUS
  Administered 2024-05-11: 175 ug/kg/min via INTRAVENOUS
  Administered 2024-05-11: 50 mg via INTRAVENOUS
  Administered 2024-05-11: 150 ug/kg/min via INTRAVENOUS
  Administered 2024-05-11: 20 mg via INTRAVENOUS

## 2024-05-11 MED ORDER — ONDANSETRON HCL 4 MG/2ML IJ SOLN
INTRAMUSCULAR | Status: DC | PRN
  Administered 2024-05-11: 4 mg via INTRAVENOUS

## 2024-05-11 MED ORDER — PROPOFOL 1000 MG/100ML IV EMUL
INTRAVENOUS | Status: AC
  Filled 2024-05-11: qty 100

## 2024-05-11 MED ORDER — SODIUM CHLORIDE 0.9 % IV SOLN: INTRAVENOUS | Status: DC

## 2024-05-11 NOTE — Anesthesia Preprocedure Evaluation (Signed)
 Anesthesia Evaluation  Patient identified by MRN, date of birth, ID band Patient awake    Reviewed: Allergy & Precautions, H&P , NPO status , Patient's Chart, lab work & pertinent test results  History of Anesthesia Complications (+) PONV and history of anesthetic complications  Airway Mallampati: II   Neck ROM: full    Dental   Pulmonary neg pulmonary ROS   breath sounds clear to auscultation       Cardiovascular hypertension,  Rhythm:regular Rate:Normal     Neuro/Psych  PSYCHIATRIC DISORDERS  Depression       GI/Hepatic   Endo/Other  diabetes, Type 2Hypothyroidism    Renal/GU Renal InsufficiencyRenal diseaseCr 1.94     Musculoskeletal   Abdominal   Peds  Hematology   Anesthesia Other Findings   Reproductive/Obstetrics                              Anesthesia Physical Anesthesia Plan  ASA: 3  Anesthesia Plan: MAC   Post-op Pain Management:    Induction: Intravenous  PONV Risk Score and Plan: 3 and Propofol  infusion and Treatment may vary due to age or medical condition  Airway Management Planned:   Additional Equipment:   Intra-op Plan:   Post-operative Plan:   Informed Consent: I have reviewed the patients History and Physical, chart, labs and discussed the procedure including the risks, benefits and alternatives for the proposed anesthesia with the patient or authorized representative who has indicated his/her understanding and acceptance.     Dental advisory given  Plan Discussed with: CRNA, Anesthesiologist and Surgeon  Anesthesia Plan Comments:         Anesthesia Quick Evaluation

## 2024-05-11 NOTE — Transfer of Care (Signed)
 Immediate Anesthesia Transfer of Care Note  Patient: [name]  Procedure(s) Performed: ULTRASOUND, UPPER GI TRACT, ENDOSCOPIC EGD (ESOPHAGOGASTRODUODENOSCOPY)  Patient Location: PACU and Endoscopy Unit  Anesthesia Type:MAC  Level of Consciousness: sedated  Airway & Oxygen  Therapy: Patient Spontanous Breathing and Patient connected to nasal cannula oxygen   Post-op Assessment: Report given to RN and Post -op Vital signs reviewed and stable  Post vital signs: Reviewed and stable  Last Vitals:  Vitals Value Taken Time  BP 112/38 05/11/24 09:52  Temp 36.7 C 05/11/24 09:52  Pulse 50 05/11/24 09:54  Resp 12 05/11/24 09:54  SpO2 100 % 05/11/24 09:54  Vitals shown include unfiled device data.  Last Pain:  Vitals:   05/11/24 0952  TempSrc: Temporal  PainSc: Asleep         Complications: No notable events documented.

## 2024-05-11 NOTE — Op Note (Signed)
 [facility] Patient Name: [name] Procedure Date: 05/11/2024 MRN: [id number] Attending MD: Aloha Finner , MD, [id number] Date of Birth: 1946/09/07 CSN: [id number] Age: 78 Admit Type: Outpatient Procedure:                Upper EUS Indications:              Large mucosal folds found in the proximal stomach                            on endoscopy, Exclusion of gastric varices Providers:                Aloha Finner, MD, Randall Lines, RN, Jasmine                            Petiford, Technician, Miyvetteshaune G,CRNA Referring MD:              Medicines:                Monitored Anesthesia Care Complications:            No immediate complications. Estimated Blood Loss:     Estimated blood loss: none. Procedure:                Pre-Anesthesia Assessment:                           - Prior to the procedure, a History and Physical                            was performed, and patient medications and                            allergies were reviewed. The patient's tolerance of                            previous anesthesia was also reviewed. The risks                            and benefits of the procedure and the sedation                            options and risks were discussed with the patient.                            All questions were answered, and informed consent                            was obtained. Prior Anticoagulants: The patient has                            taken no anticoagulant or antiplatelet agents. ASA                            Grade Assessment: III - A patient with severe  systemic disease. After reviewing the risks and                            benefits, the patient was deemed in satisfactory                            condition to undergo the procedure.                           After obtaining informed consent, the endoscope was                            passed under direct vision. Throughout the                             procedure, the patient's blood pressure, pulse, and                            oxygen  saturations were monitored continuously. The                            [id number]-[id number] ([id number]) Olympus endoscope was introduced                            through the mouth, and advanced to the second part                            of duodenum. The [id number]-[id number] ([id number]) Olympus                            radial ultrasound scope was introduced through the                            mouth, and advanced to the stomach for ultrasound                            examination from the esophagus and stomach. The                            upper EUS was accomplished without difficulty. The                            patient tolerated the procedure. Scope In: Scope Out: Findings:      ENDOSCOPIC FINDING: :      No gross lesions were noted in the entire esophagus.      A widely patent Schatzki ring was found at the gastroesophageal junction.      The Z-line was irregular and was found 36 cm from the incisors.      A 3 cm hiatal hernia was present.      No gross lesions were noted in the entire examined stomach.      No gross lesions were noted in the duodenal bulb, in the first portion       of the duodenum and in the second portion of the duodenum.  ENDOSONOGRAPHIC FINDING: :      There was no sign of significant endosonographic abnormality in the       thoracic esophagus and in the gastroesophageal junction. No masses and       no wall thickening were identified.      Endosonographic images of the stomach were unremarkable. No masses and       no wall thickening were identified.      Endosonographic imaging in the visualized portion of the liver showed no       mass.      No malignant-appearing lymph nodes were visualized in the paracardial       region (level 16), left gastric region (level 17) and celiac region       (level 20).      The celiac region was visualized. Impression:                EGD impression:                           - No gross lesions in the entire esophagus.                           - Widely patent Schatzki ring.                           - Z-line irregular, 36 cm from the incisors.                           - 3 cm hiatal hernia.                           - No gross lesions in the entire stomach.                           - No gross lesions in the duodenal bulb, in the                            first portion of the duodenum and in the second                            portion of the duodenum.                           EUS impression:                           - There was no sign of significant pathology in the                            thoracic esophagus and in the gastroesophageal                            junction. No evidence of varicose veins.                           - Endosonographic images of the stomach were  unremarkable. No evidence of gastric varices noted                            in the cardia/fundus.                           - No malignant-appearing lymph nodes were                            visualized in the paracardial region (level 16),                            left gastric region (level 17) and celiac region                            (level 20). Moderate Sedation:      Not Applicable - Patient had care per Anesthesia. Recommendation:           - The patient will be observed post-procedure,                            until all discharge criteria are met.                           - Discharge patient to home.                           - Patient has a contact number available for                            emergencies. The signs and symptoms of potential                            delayed complications were discussed with the                            patient. Return to normal activities tomorrow.                            Written discharge instructions were provided to the                             patient.                           - Resume previous diet.                           - Observe patient's clinical course.                           - Based on last cardiology note it was recommended                            that patient's stop metoprolol  and only be on  carvedilol . This has been recommended and placed in                            the medication adjustment for her discharge                            medicines today. Will confirm with her family as                            well.                           - Follow-up with primary GI as necessary in future.                           - Continue present medications otherwise.                           - The findings and recommendations were discussed                            with the patient.                           - The findings and recommendations were discussed                            with the patient's family. Procedure Code(s):        --- Professional ---                           [phone], Esophagogastroduodenoscopy, flexible,                            transoral; with endoscopic ultrasound examination                            limited to the esophagus, stomach or duodenum, and                            adjacent structures Diagnosis Code(s):        --- Professional ---                           K22.2, Esophageal obstruction                           K22.89, Other specified disease of esophagus                           K44.9, Diaphragmatic hernia without obstruction or                            gangrene                           I89.9, Noninfective disorder of lymphatic vessels  and lymph nodes, unspecified                           K31.89, Other diseases of stomach and duodenum CPT copyright 2022 American Medical Association. All rights reserved. The codes documented in this report are preliminary and upon coder review may  be revised to meet current  compliance requirements. Aloha Finner, MD 05/11/2024 9:56:18 AM Number of Addenda: 0

## 2024-05-11 NOTE — Anesthesia Postprocedure Evaluation (Signed)
 Anesthesia Post Note  Patient: [name]  Procedure(s) Performed: ULTRASOUND, UPPER GI TRACT, ENDOSCOPIC EGD (ESOPHAGOGASTRODUODENOSCOPY)     Patient location during evaluation: Endoscopy Anesthesia Type: MAC Level of consciousness: awake and alert Pain management: pain level controlled Vital Signs Assessment: post-procedure vital signs reviewed and stable Respiratory status: spontaneous breathing, nonlabored ventilation, respiratory function stable and patient connected to nasal cannula oxygen  Cardiovascular status: stable and blood pressure returned to baseline Postop Assessment: no apparent nausea or vomiting Anesthetic complications: no   No notable events documented.  Last Vitals:  Vitals:   05/11/24 1000 05/11/24 1010  BP: (!) 114/42 (!) 149/51  Pulse: (!) 53 (!) 54  Resp: (!) 21 16  Temp:    SpO2: 100% 99%    Last Pain:  Vitals:   05/11/24 1010  TempSrc:   PainSc: 0-No pain                 Caisen Mangas S

## 2024-05-11 NOTE — Discharge Instructions (Signed)

## 2024-05-11 NOTE — Anesthesia Procedure Notes (Signed)
 Procedure Name: MAC Date/Time: 05/11/2024 9:12 AM  Performed by: Landy Chip HERO, CRNAPre-anesthesia Checklist: Patient identified, Emergency Drugs available, Suction available, Patient being monitored and Timeout performed Patient Re-evaluated:Patient Re-evaluated prior to induction Oxygen  Delivery Method: Nasal cannula Preoxygenation: Pre-oxygenation with 100% oxygen  Induction Type: IV induction Placement Confirmation: positive ETCO2 Dental Injury: Teeth and Oropharynx as per pre-operative assessment

## 2024-05-11 NOTE — H&P (Signed)
 GASTROENTEROLOGY PROCEDURE H&P NOTE   Primary Care Physician: Marvine Rush, MD  HPI: [name] is a 78 y.o. female who presents for EGD/EUS to evaluate gastric abnormality and rule out varices.  Past Medical History:  Diagnosis Date   Cancer [facility]) 1983   Breast Cancer   Diabetes mellitus without complication (HCC)    High cholesterol    Hypertension    Hypothyroidism    Overactive bladder    PONV (postoperative nausea and vomiting)    Past Surgical History:  Procedure Laterality Date   ABDOMINAL HYSTERECTOMY     BIOPSY  10/14/2023   Procedure: BIOPSY;  Surgeon: Cinderella [name], MD;  [facility];  Service: Endoscopy;;   COLONOSCOPY WITH PROPOFOL  N/A 11/25/2023   Procedure: COLONOSCOPY WITH PROPOFOL ;  Surgeon: Cinderella [name], MD;  [facility];  Service: Endoscopy;  Laterality: N/A;  10:00AM;ASA 3   ESOPHAGOGASTRODUODENOSCOPY (EGD) WITH PROPOFOL  N/A 10/14/2023   Procedure: ESOPHAGOGASTRODUODENOSCOPY (EGD) WITH PROPOFOL ;  Surgeon: Cinderella [name], MD;  [facility];  Service: Endoscopy;  Laterality: N/A;   FOOT SURGERY     HEMOSTASIS CLIP PLACEMENT  11/25/2023   Procedure: HEMOSTASIS CLIP PLACEMENT;  Surgeon: Cinderella [name], MD;  [facility];  Service: Endoscopy;;   POLYPECTOMY  11/25/2023   Procedure: POLYPECTOMY;  Surgeon: Cinderella [name], MD;  [facility];  Service: Endoscopy;;  hot and cold   SCLEROTHERAPY  11/25/2023   Procedure: SCLEROTHERAPY;  Surgeon: Cinderella [name], MD;  [facility];  Service: Endoscopy;;   SUBMUCOSAL LIFTING INJECTION  11/25/2023   Procedure: SUBMUCOSAL LIFTING INJECTION;  Surgeon: Cinderella [name], MD;  [facility];  Service: Endoscopy;;   No current facility-administered medications for this encounter.   No current facility-administered medications for this encounter. No Known Allergies Family History  Problem Relation Age of Onset   Heart failure  Mother        22   Brain cancer Father    Social History   Socioeconomic History   Marital status: Single    Spouse name: Not on file   Number of children: Not on file   Years of education: Not on file   Highest education level: High school graduate  Occupational History   Occupation: retired   Tobacco Use   Smoking status: Never   Smokeless tobacco: Never  Vaping Use   Vaping status: Never Used  Substance and Sexual Activity   Alcohol use: Never   Drug use: Never   Sexual activity: Not Currently  Other Topics Concern   Not on file  Social History Narrative   Lives with daughter.  Two children.    Social Drivers of Corporate investment banker Strain: Not on file  Food Insecurity: Food Insecurity Present (03/03/2024)   Hunger Vital Sign    Worried About Running Out of Food in the Last Year: Sometimes true    Ran Out of Food in the Last Year: Sometimes true  Transportation Needs: No Transportation Needs (03/03/2024)   PRAPARE - Administrator, Civil Service (Medical): No    Lack of Transportation (Non-Medical): No  Physical Activity: Not on file  Stress: Not on file  Social Connections: Moderately Isolated (10/13/2023)   Social Connection and Isolation Panel    Frequency of Communication with Friends and Family: More than three times a week    Frequency of Social Gatherings with Friends and Family: Once a  week    Attends Religious Services: More than 4 times per year    Active Member of Clubs or Organizations: No    Attends Banker Meetings: Never    Marital Status: Widowed  Intimate Partner Violence: Not At Risk (03/03/2024)   Humiliation, Afraid, Rape, and Kick questionnaire    Fear of Current or Ex-Partner: No    Emotionally Abused: No    Physically Abused: No    Sexually Abused: No    Physical Exam: Today's Vitals   05/04/24 1132  Weight: 58 kg   Body mass index is 23.39 kg/m. GEN: NAD EYE: Sclerae anicteric ENT: MMM CV:  Non-tachycardic GI: Soft, NT/ND NEURO:  Alert & Oriented x 3  Lab Results: Recent Labs    05/10/24 0937  WBC 13.4*  HGB 10.4*  HCT 32.9*  PLT 301   BMET Recent Labs    05/10/24 0937  NA 138  K 4.2  CL 98  CO2 26  GLUCOSE 98  BUN 34*  CREATININE 1.94*  CALCIUM 9.5   LFT Recent Labs    05/10/24 0937  PROT 6.6  ALBUMIN 3.9  AST 16  ALT 12  ALKPHOS 43  BILITOT 0.7   PT/INR No results for input(s): LABPROT, INR in the last 72 hours.   Impression / Plan: This is a 78 y.o.female who presents for EGD/EUS to evaluate gastric abnormality and rule out varices.  The risks of an EUS including intestinal perforation, bleeding, infection, aspiration, and medication effects were discussed as was the possibility it may not give a definitive diagnosis if a biopsy is performed.  When a biopsy of the pancreas is done as part of the EUS, there is an additional risk of pancreatitis at the rate of about 1-2%.  It was explained that procedure related pancreatitis is typically mild, although it can be severe and even life threatening, which is why we do not perform random pancreatic biopsies and only biopsy a lesion/area we feel is concerning enough to warrant the risk.   The risks and benefits of endoscopic evaluation/treatment were discussed with the patient and/or family; these include but are not limited to the risk of perforation, infection, bleeding, missed lesions, lack of diagnosis, severe illness requiring hospitalization, as well as anesthesia and sedation related illnesses.  The patient's history has been reviewed, patient examined, no change in status, and deemed stable for procedure.  The patient and/or family is agreeable to proceed.    Emma Finner, MD [facility] Office # [phone]

## 2024-05-15 ENCOUNTER — Encounter (HOSPITAL_COMMUNITY): Payer: Self-pay | Admitting: Gastroenterology

## 2024-05-16 ENCOUNTER — Other Ambulatory Visit (HOSPITAL_BASED_OUTPATIENT_CLINIC_OR_DEPARTMENT_OTHER): Payer: Self-pay

## 2024-05-17 ENCOUNTER — Other Ambulatory Visit (HOSPITAL_BASED_OUTPATIENT_CLINIC_OR_DEPARTMENT_OTHER): Payer: Self-pay

## 2024-05-18 ENCOUNTER — Other Ambulatory Visit (HOSPITAL_BASED_OUTPATIENT_CLINIC_OR_DEPARTMENT_OTHER): Payer: Self-pay

## 2024-05-18 MED ORDER — PROMETHAZINE HCL 25 MG PO TABS
25.0000 mg | ORAL_TABLET | Freq: Four times a day (QID) | ORAL | 0 refills | Status: DC
  Filled 2024-05-18 – 2024-06-07 (×3): qty 30, 8d supply, fill #0

## 2024-05-23 ENCOUNTER — Other Ambulatory Visit (HOSPITAL_BASED_OUTPATIENT_CLINIC_OR_DEPARTMENT_OTHER): Payer: Self-pay

## 2024-05-24 ENCOUNTER — Other Ambulatory Visit: Payer: Self-pay

## 2024-05-24 DIAGNOSIS — D649 Anemia, unspecified: Secondary | ICD-10-CM

## 2024-05-24 NOTE — Progress Notes (Signed)
 Lab orders placed.

## 2024-05-25 ENCOUNTER — Inpatient Hospital Stay

## 2024-05-25 ENCOUNTER — Ambulatory Visit (INDEPENDENT_AMBULATORY_CARE_PROVIDER_SITE_OTHER): Admitting: Gastroenterology

## 2024-05-25 ENCOUNTER — Inpatient Hospital Stay: Attending: Hematology

## 2024-05-25 DIAGNOSIS — E1122 Type 2 diabetes mellitus with diabetic chronic kidney disease: Secondary | ICD-10-CM | POA: Insufficient documentation

## 2024-05-25 DIAGNOSIS — N185 Chronic kidney disease, stage 5: Secondary | ICD-10-CM | POA: Insufficient documentation

## 2024-05-25 DIAGNOSIS — D631 Anemia in chronic kidney disease: Secondary | ICD-10-CM | POA: Insufficient documentation

## 2024-05-25 DIAGNOSIS — D649 Anemia, unspecified: Secondary | ICD-10-CM

## 2024-05-25 LAB — CBC WITH DIFFERENTIAL/PLATELET
Abs Immature Granulocytes: 0.04 K/uL (ref 0.00–0.07)
Basophils Absolute: 0.1 K/uL (ref 0.0–0.1)
Basophils Relative: 1 %
Eosinophils Absolute: 0.2 K/uL (ref 0.0–0.5)
Eosinophils Relative: 1 %
HCT: 34.2 % — ABNORMAL LOW (ref 36.0–46.0)
Hemoglobin: 11.1 g/dL — ABNORMAL LOW (ref 12.0–15.0)
Immature Granulocytes: 0 %
Lymphocytes Relative: 17 %
Lymphs Abs: 2 K/uL (ref 0.7–4.0)
MCH: 31.2 pg (ref 26.0–34.0)
MCHC: 32.5 g/dL (ref 30.0–36.0)
MCV: 96.1 fL (ref 80.0–100.0)
Monocytes Absolute: 0.9 K/uL (ref 0.1–1.0)
Monocytes Relative: 8 %
Neutro Abs: 8.9 K/uL — ABNORMAL HIGH (ref 1.7–7.7)
Neutrophils Relative %: 73 %
Platelets: 328 K/uL (ref 150–400)
RBC: 3.56 MIL/uL — ABNORMAL LOW (ref 3.87–5.11)
RDW: 12.7 % (ref 11.5–15.5)
WBC: 12.1 K/uL — ABNORMAL HIGH (ref 4.0–10.5)
nRBC: 0 % (ref 0.0–0.2)

## 2024-05-25 NOTE — Progress Notes (Signed)
 Patient presents for injection of Retacrit  today. Hgb 11.1, injection not indicated today. Patient made aware.

## 2024-05-26 ENCOUNTER — Other Ambulatory Visit (HOSPITAL_BASED_OUTPATIENT_CLINIC_OR_DEPARTMENT_OTHER): Payer: Self-pay

## 2024-05-29 ENCOUNTER — Other Ambulatory Visit (HOSPITAL_BASED_OUTPATIENT_CLINIC_OR_DEPARTMENT_OTHER): Payer: Self-pay

## 2024-05-29 ENCOUNTER — Other Ambulatory Visit: Payer: Self-pay

## 2024-05-29 ENCOUNTER — Encounter: Payer: Self-pay | Admitting: Oncology

## 2024-05-30 ENCOUNTER — Other Ambulatory Visit (HOSPITAL_BASED_OUTPATIENT_CLINIC_OR_DEPARTMENT_OTHER): Payer: Self-pay

## 2024-06-01 ENCOUNTER — Inpatient Hospital Stay: Admitting: Oncology

## 2024-06-01 NOTE — Progress Notes (Incomplete)
 [facility]  HEMATOLOGY FOLLOW-UP VISIT  Marvine Rush, MD  REASON FOR FOLLOW-UP: Anemia of chronic kidney disease  ASSESSMENT & PLAN:  Patient is a 78 y.o. female following for anemia of chronic kidney disease  No problem-specific Assessment & Plan notes found for this encounter.    No orders of the defined types were placed in this encounter.   The total time spent in the appointment was 20 minutes encounter with patients including review of chart and various tests results, discussions about plan of care and coordination of care plan   All questions were answered. The patient knows to call the clinic with any problems, questions or concerns. No barriers to learning was detected.   LILLETTE Verneta SAUNDERS Teague,acting as a Neurosurgeon for Mickiel Dry, MD.,have documented all relevant documentation on the behalf of Mickiel Dry, MD,as directed by  Mickiel Dry, MD while in the presence of Mickiel Dry, MD.  ***   Verneta R Teague 8/21/20258:14 AM   SUMMARY OF HEMATOLOGIC HISTORY: Anemia of chronic kidney disease -SPEP: No M spike - Slightly elevated free light chains with normal ratio  INTERVAL HISTORY: [name] 78 y.o. female following for anemia of chronic kidney disease.    I have reviewed the past medical history, past surgical history, social history and family history with the patient   ALLERGIES:  has no known allergies.  MEDICATIONS:  Current Outpatient Medications  Medication Sig Dispense Refill   ACCU-CHEK AVIVA PLUS test strip 1 each by Other route 4 (four) times daily.   5   ACCU-CHEK FASTCLIX LANCETS MISC U TO TEST QID  10   Accu-Chek FastClix Lancets MISC Use to check blood glucose 3 times daily as directed 102 each 11   amLODipine  (NORVASC ) 10 MG tablet Take 10 mg by mouth daily.     amLODipine  (NORVASC ) 10 MG tablet Take 1 tablet (10 mg total) by mouth daily. 90 tablet 3   amLODipine  (NORVASC ) 5 MG tablet Take 1  tablet (5 mg total) by mouth daily. 90 tablet 3   aspirin  81 MG chewable tablet Chew 1 tablet (81 mg total) by mouth daily.     carvedilol  (COREG ) 12.5 MG tablet Take 1 tablet (12.5 mg total) by mouth 2 (two) times daily. 180 tablet 3   cloNIDine  (CATAPRES ) 0.2 MG tablet Take 1 tablet (0.2 mg total) by mouth 2 (two) times daily. 180 tablet 3   donepezil  (ARICEPT ) 5 MG tablet Take 5 mg by mouth. (Patient not taking: Reported on 03/16/2024)     donepezil  (ARICEPT ) 5 MG tablet Take 1 tablet (5 mg total) by mouth daily. 90 tablet 1   escitalopram  (LEXAPRO ) 10 MG tablet Take 10 mg by mouth daily.   1   ferrous sulfate  325 (65 FE) MG EC tablet Take 1 tablet (325 mg total) by mouth every other day. 60 tablet 0   fesoterodine  (TOVIAZ ) 8 MG TB24 tablet Take 1 tablet (8 mg total) by mouth daily. 30 tablet 11   glucose blood test strip Use to check blood sugar 4 times daily 100 each 3   hydrochlorothiazide  (HYDRODIURIL ) 25 MG tablet Take 1 tablet (25 mg total) by mouth daily. 90 tablet 2   JANUMET  50-1000 MG tablet Take 1 tablet by mouth 2 (two) times daily.     Lancets Misc. (ACCU-CHEK FASTCLIX LANCET) KIT See admin instructions.     levothyroxine  (SYNTHROID , LEVOTHROID) 100 MCG tablet Take 100 mcg by mouth daily.   3  olmesartan  (BENICAR ) 40 MG tablet Take 40 mg by mouth daily.     pantoprazole  (PROTONIX ) 40 MG tablet Take 1 tablet (40 mg total) by mouth daily at 6 (six) AM. 30 tablet 1   polyethylene glycol (MIRALAX  / GLYCOLAX ) 17 g packet Take 17 g by mouth 2 (two) times daily. 180 each 0   PREVIDENT 5000 DRY MOUTH 1.1 % GEL dental gel Place 1 Application onto teeth at bedtime.     promethazine  (PHENERGAN ) 25 MG tablet Take 1 tablet (25 mg total) by mouth every 6 (six) hours. 30 tablet 0   simvastatin  (ZOCOR ) 40 MG tablet Take 1 tablet (40 mg total) by mouth daily. 90 tablet 2   sitaGLIPtin -metformin  (JANUMET ) 50-1000 MG tablet Take 1 tablet by mouth 2 (two) times daily. 180 tablet 2   Sodium Fluoride   1.1 % PSTE Apply 1 drop to the teeth, brush for 2 minutes, then spit. No eating, drinking, or rinsing for 30 minutes 100 mL 12   No current facility-administered medications for this visit.     REVIEW OF SYSTEMS:   Constitutional: Denies fevers, chills or night sweats Eyes: Denies blurriness of vision Ears, nose, mouth, throat, and face: Denies mucositis or sore throat Respiratory: Denies cough, dyspnea or wheezes Cardiovascular: Denies palpitation, chest discomfort or lower extremity swelling Gastrointestinal:  Denies nausea, heartburn or change in bowel habits Skin: Denies abnormal skin rashes Lymphatics: Denies new lymphadenopathy or easy bruising Neurological:Denies numbness, tingling or new weaknesses Behavioral/Psych: Mood is stable, no new changes  All other systems were reviewed with the patient and are negative.  PHYSICAL EXAMINATION:   There were no vitals filed for this visit.   GENERAL:alert, no distress and comfortable SKIN: skin color, texture, turgor are normal, no rashes or significant lesions LUNGS: clear to auscultation and percussion with normal breathing effort HEART: regular rate & rhythm and no murmurs and no lower extremity edema ABDOMEN:abdomen soft, non-tender and normal bowel sounds Musculoskeletal:no cyanosis of digits and no clubbing  NEURO: alert & oriented x 3 with fluent speech  LABORATORY DATA:  I have reviewed the data as listed  Lab Results  Component Value Date   WBC 12.1 (H) 05/25/2024   NEUTROABS 8.9 (H) 05/25/2024   HGB 11.1 (L) 05/25/2024   HCT 34.2 (L) 05/25/2024   MCV 96.1 05/25/2024   PLT 328 05/25/2024      Chemistry      Component Value Date/Time   NA 138 05/10/2024 0937   NA 138 11/16/2023 1536   K 4.2 05/10/2024 0937   CL 98 05/10/2024 0937   CO2 26 05/10/2024 0937   BUN 34 (H) 05/10/2024 0937   BUN 13 11/16/2023 1536   CREATININE 1.94 (H) 05/10/2024 0937      Component Value Date/Time   CALCIUM 9.5 05/10/2024  0937   ALKPHOS 43 05/10/2024 0937   AST 16 05/10/2024 0937   ALT 12 05/10/2024 0937   BILITOT 0.7 05/10/2024 0937   BILITOT 0.4 11/16/2023 1536      Latest Reference Range & Units 02/23/24 15:46 03/03/24 11:55  Iron 27 - 139 ug/dL 88   UIBC 881 - 630 ug/dL 808   TIBC 749 - 549 ug/dL 720   Ferritin 15 - 849 ng/mL 55   Iron Saturation 15 - 55 % 32   Folate >3.0 ng/mL 15.1   Copper  80 - 158 ug/dL 81   Vitamin B12 767 - 1,245 pg/mL 357   Total Protein ELP 6.0 - 8.5 g/dL  5.7 (  L) (C)  Albumin SerPl Elph-Mcnc 2.9 - 4.4 g/dL  3.5 (C)  Albumin/Glob SerPl 0.7 - 1.7   1.6 (C)  Alpha2 Glob SerPl Elph-Mcnc 0.4 - 1.0 g/dL  0.7 (C)  Alpha 1 0.0 - 0.4 g/dL  0.2 (C)  Gamma Glob SerPl Elph-Mcnc 0.4 - 1.8 g/dL  0.5 (C)  M Protein SerPl Elph-Mcnc Not Observed g/dL  Not Observed (C)  IFE 1   Comment (C)  Globulin, Total 2.2 - 3.9 g/dL  2.2 (C)  B-Globulin SerPl Elph-Mcnc 0.7 - 1.3 g/dL  0.8 (C)  IgG (Immunoglobin G), Serum 586 - 1,602 mg/dL  421 (L)  IgM (Immunoglobulin M), Srm 26 - 217 mg/dL  22 (L)  IgA 64 - 577 mg/dL  768  (L): Data is abnormally low (C): Corrected IFE 1 Comment VC   Comment: (NOTE) The immunofixation pattern appears unremarkable. Evidence of monoclonal protein is not apparent.     Latest Reference Range & Units 03/03/24 11:55  Kappa free light chain 3.3 - 19.4 mg/L 33.1 (H)  Lambda free light chains 5.7 - 26.3 mg/L 32.4 (H)  Kappa, lambda light chain ratio 0.26 - 1.65  1.02  (H): Data is abnormally high   RADIOGRAPHIC STUDIES: I have personally reviewed the radiological images as listed and agreed with the findings in the report.  None to review

## 2024-06-07 ENCOUNTER — Other Ambulatory Visit (HOSPITAL_BASED_OUTPATIENT_CLINIC_OR_DEPARTMENT_OTHER): Payer: Self-pay

## 2024-06-07 ENCOUNTER — Other Ambulatory Visit: Payer: Self-pay

## 2024-06-07 DIAGNOSIS — D649 Anemia, unspecified: Secondary | ICD-10-CM

## 2024-06-07 DIAGNOSIS — E538 Deficiency of other specified B group vitamins: Secondary | ICD-10-CM | POA: Insufficient documentation

## 2024-06-07 NOTE — Assessment & Plan Note (Addendum)
 Patient has mild vitamin B12 deficiency with levels less than 400  - Start oral vitamin B12 1000 mcg daily

## 2024-06-07 NOTE — Assessment & Plan Note (Addendum)
 Likely secondary to chronic kidney disease.  Normal ratio SPEP: No M spike  - No further workup necessary at this time

## 2024-06-07 NOTE — Progress Notes (Unsigned)
 [facility] at [facility]  HEMATOLOGY FOLLOW-UP VISIT  Marvine Rush, MD  REASON FOR FOLLOW-UP: Anemia of chronic kidney disease  ASSESSMENT & PLAN:  Patient is a 78 y.o. female following for anemia of chronic kidney disease  Assessment & Plan Anemia in stage 5 chronic kidney disease, not on chronic dialysis (HCC) Normocytic anemia likely secondary to chronic kidney disease TSAT within goal of 25-30 SPEP: No M spike, slightly elevated free light chains but normal ratio -03/16/2024: Started on EPO shots every 2 weeks  -Labs reviewed today: TSAT: 15, ferritin: 20, B12: 229, CMP: Creatinine: 1.94, GFR: 26, CBC: Hemoglobin: 9.3, normal WBC and platelets - Will administer EPO shot today.  Will continue to check every 2 weeks and give if hemoglobin is less than 11 - Will also administer 1 dose of IV Monoferric with a TSAT goal of 25-30 as long as ferritin is less than 400.  Discussed side effects of IV iron infusion including allergic reactions, nausea and headache.  Patient is symptomatic at this time and will benefit from quicker hematopoiesis. - Start taking oral iron every other day.  Use MiraLAX  for constipation -Continue to follow with nephrology  Return to clinic in 3 months to see if there is an improvement, if not we will increase the dose of EPO  Elevated serum immunoglobulin free light chains Likely secondary to chronic kidney disease.  Normal ratio SPEP: No M spike  - No further workup necessary at this time Vitamin B12 deficiency Patient has mild vitamin B12 deficiency with levels less than 400  - Start oral vitamin B12 1000 mcg daily    Orders Placed This Encounter  Procedures   CBC with Differential    Standing Status:   Future    Expected Date:   09/04/2024    Expiration Date:   12/03/2024   Comprehensive metabolic panel    Standing Status:   Future    Expected Date:   09/04/2024    Expiration Date:   12/03/2024   Iron and TIBC ([facility] ONLY)    Standing Status:   Future    Expected Date:   09/04/2024    Expiration Date:   12/03/2024   Ferritin    Standing Status:   Future    Expected Date:   09/04/2024    Expiration Date:   12/03/2024   Vitamin B12    Standing Status:   Future    Expected Date:   09/04/2024    Expiration Date:   12/03/2024   Folate    Standing Status:   Future    Expected Date:   09/04/2024    Expiration Date:   12/03/2024    The total time spent in the appointment was 20 minutes encounter with patients including review of chart and various tests results, discussions about plan of care and coordination of care plan   All questions were answered. The patient knows to call the clinic with any problems, questions or concerns. No barriers to learning was detected.  Mickiel Dry, MD 8/29/20252:53 PM   SUMMARY OF HEMATOLOGIC HISTORY: Anemia of chronic kidney disease -SPEP: No M spike - Slightly elevated free light chains with normal ratio -03/16/2024: Started EPO 10000U every 2 weeks  INTERVAL HISTORY: MICHAYLA MCNEIL 78 y.o. female following for anemia of chronic kidney disease.   She experiences persistent tiredness and is currently taking iron supplements every other day. She previously stopped taking iron pills on another doctor's advice. No adverse  reactions to iron pills are reported.No blood in stools is reported.   I have reviewed the past medical history, past surgical history, social history and family history with the patient   ALLERGIES:  has no known allergies.  MEDICATIONS:  Current Outpatient Medications  Medication Sig Dispense Refill   ACCU-CHEK AVIVA PLUS test strip 1 each by Other route 4 (four) times daily.   5   ACCU-CHEK FASTCLIX LANCETS MISC U TO TEST QID  10   Accu-Chek FastClix Lancets MISC Use to check blood glucose 3 times daily as directed 102 each 11   amLODipine  (NORVASC ) 10 MG tablet Take 10 mg by mouth daily.     amLODipine  (NORVASC ) 10 MG  tablet Take 1 tablet (10 mg total) by mouth daily. 90 tablet 3   amLODipine  (NORVASC ) 5 MG tablet Take 1 tablet (5 mg total) by mouth daily. 90 tablet 3   aspirin  81 MG chewable tablet Chew 1 tablet (81 mg total) by mouth daily.     carvedilol  (COREG ) 12.5 MG tablet Take 1 tablet (12.5 mg total) by mouth 2 (two) times daily. 180 tablet 3   cloNIDine  (CATAPRES ) 0.2 MG tablet Take 1 tablet (0.2 mg total) by mouth 2 (two) times daily. 180 tablet 3   cyanocobalamin  (VITAMIN B12) 1000 MCG tablet Take 1 tablet (1,000 mcg total) by mouth daily. 90 tablet 2   donepezil  (ARICEPT ) 5 MG tablet Take 5 mg by mouth.     donepezil  (ARICEPT ) 5 MG tablet Take 1 tablet (5 mg total) by mouth daily. 90 tablet 1   escitalopram  (LEXAPRO ) 10 MG tablet Take 10 mg by mouth daily.   1   ferrous sulfate  325 (65 FE) MG EC tablet Take 1 tablet (325 mg total) by mouth every other day. 60 tablet 0   fesoterodine  (TOVIAZ ) 8 MG TB24 tablet Take 1 tablet (8 mg total) by mouth daily. 30 tablet 11   glucose blood test strip Use to check blood sugar 4 times daily 100 each 3   hydrochlorothiazide  (HYDRODIURIL ) 25 MG tablet Take 1 tablet (25 mg total) by mouth daily. 90 tablet 2   JANUMET  50-1000 MG tablet Take 1 tablet by mouth 2 (two) times daily.     Lancets Misc. (ACCU-CHEK FASTCLIX LANCET) KIT See admin instructions.     levothyroxine  (SYNTHROID , LEVOTHROID) 100 MCG tablet Take 100 mcg by mouth daily.   3   olmesartan  (BENICAR ) 40 MG tablet Take 40 mg by mouth daily.     pantoprazole  (PROTONIX ) 40 MG tablet Take 1 tablet (40 mg total) by mouth daily at 6 (six) AM. 30 tablet 1   polyethylene glycol (MIRALAX  / GLYCOLAX ) 17 g packet Take 17 g by mouth 2 (two) times daily. 180 each 0   PREVIDENT 5000 DRY MOUTH 1.1 % GEL dental gel Place 1 Application onto teeth at bedtime.     promethazine  (PHENERGAN ) 25 MG tablet Take 1 tablet (25 mg total) by mouth every 6 (six) hours. 30 tablet 0   simvastatin  (ZOCOR ) 40 MG tablet Take 1 tablet  (40 mg total) by mouth daily. 90 tablet 2   sitaGLIPtin -metformin  (JANUMET ) 50-1000 MG tablet Take 1 tablet by mouth 2 (two) times daily. 180 tablet 2   Sodium Fluoride  1.1 % PSTE Apply 1 drop to the teeth, brush for 2 minutes, then spit. No eating, drinking, or rinsing for 30 minutes 100 mL 12   No current facility-administered medications for this visit.     REVIEW OF SYSTEMS:  Constitutional: Denies fevers, chills or night sweats Eyes: Denies blurriness of vision Ears, nose, mouth, throat, and face: Denies mucositis or sore throat Respiratory: Denies cough, dyspnea or wheezes Cardiovascular: Denies palpitation, chest discomfort or lower extremity swelling Gastrointestinal:  Denies nausea, heartburn or change in bowel habits Skin: Denies abnormal skin rashes Lymphatics: Denies new lymphadenopathy or easy bruising Neurological:Denies numbness, tingling or new weaknesses Behavioral/Psych: Mood is stable, no new changes  All other systems were reviewed with the patient and are negative.  PHYSICAL EXAMINATION:   Vitals:   06/08/24 1007  BP: (!) 113/49  Pulse: 76  Resp: 16  Temp: (!) 97.5 F (36.4 C)  SpO2: 100%   GENERAL:alert, no distress and comfortable SKIN: skin color, texture, turgor are normal, no rashes or significant lesions LUNGS: clear to auscultation and percussion with normal breathing effort HEART: regular rate & rhythm and no murmurs and no lower extremity edema ABDOMEN:abdomen soft, non-tender and normal bowel sounds Musculoskeletal:no cyanosis of digits and no clubbing  NEURO: alert & oriented x 3 with fluent speech  LABORATORY DATA:  I have reviewed the data as listed  Lab Results  Component Value Date   WBC 8.5 06/08/2024   NEUTROABS 4.4 06/08/2024   HGB 9.3 (L) 06/08/2024   HCT 28.7 (L) 06/08/2024   MCV 97.3 06/08/2024   PLT 292 06/08/2024      Chemistry      Component Value Date/Time   NA 138 05/10/2024 0937   NA 138 11/16/2023 1536   K 4.2  05/10/2024 0937   CL 98 05/10/2024 0937   CO2 26 05/10/2024 0937   BUN 34 (H) 05/10/2024 0937   BUN 13 11/16/2023 1536   CREATININE 1.94 (H) 05/10/2024 0937      Component Value Date/Time   CALCIUM 9.5 05/10/2024 0937   ALKPHOS 43 05/10/2024 0937   AST 16 05/10/2024 0937   ALT 12 05/10/2024 0937   BILITOT 0.7 05/10/2024 0937   BILITOT 0.4 11/16/2023 1536       Latest Reference Range & Units 05/10/24 09:37  Iron 28 - 170 ug/dL 56  UIBC ug/dL 692  TIBC 749 - 549 ug/dL 636  Saturation Ratios 10.4 - 31.8 % 15  Ferritin 11 - 307 ng/mL 20  Folate >5.9 ng/mL 7.4  Vitamin B12 180 - 914 pg/mL 229     Latest Reference Range & Units 03/03/24 11:55  Total Protein ELP 6.0 - 8.5 g/dL 5.7 (L) (C)  Albumin SerPl Elph-Mcnc 2.9 - 4.4 g/dL 3.5 (C)  Albumin/Glob SerPl 0.7 - 1.7  1.6 (C)  Alpha2 Glob SerPl Elph-Mcnc 0.4 - 1.0 g/dL 0.7 (C)  Alpha 1 0.0 - 0.4 g/dL 0.2 (C)  Gamma Glob SerPl Elph-Mcnc 0.4 - 1.8 g/dL 0.5 (C)  M Protein SerPl Elph-Mcnc Not Observed g/dL Not Observed (C)  IFE 1  Comment (C)  Globulin, Total 2.2 - 3.9 g/dL 2.2 (C)  B-Globulin SerPl Elph-Mcnc 0.7 - 1.3 g/dL 0.8 (C)  IgG (Immunoglobin G), Serum 586 - 1,602 mg/dL 421 (L)  IgM (Immunoglobulin M), Srm 26 - 217 mg/dL 22 (L)  IgA 64 - 577 mg/dL 768  (L): Data is abnormally low (C): Corrected   Latest Reference Range & Units 03/03/24 11:55  Kappa free light chain 3.3 - 19.4 mg/L 33.1 (H)  Lambda free light chains 5.7 - 26.3 mg/L 32.4 (H)  Kappa, lambda light chain ratio 0.26 - 1.65  1.02  (H): Data is abnormally high   RADIOGRAPHIC STUDIES: I have personally reviewed  the radiological images as listed and agreed with the findings in the report.  None to review

## 2024-06-07 NOTE — Assessment & Plan Note (Addendum)
 Normocytic anemia likely secondary to chronic kidney disease TSAT within goal of 25-30 SPEP: No M spike, slightly elevated free light chains but normal ratio  -Recent hemoglobin is 10. - Discussed that patient would need EPO shots every 2 weeks for hemoglobin less than 10.  Will recheck CBC today and schedule for every 2 weeks shots.  Discussed risk versus benefits and due to including most common adverse effects like hypertension, stroke and thrombosis. - If the above workup is negative and hemoglobin is less than 10, will start patient on erythropoietin  shots every 2 weeks.  Return to clinic in 2 months to see if there is an improvement, if not we will increase the dose of EPO

## 2024-06-08 ENCOUNTER — Encounter: Payer: Self-pay | Admitting: Oncology

## 2024-06-08 ENCOUNTER — Inpatient Hospital Stay

## 2024-06-08 ENCOUNTER — Inpatient Hospital Stay (HOSPITAL_BASED_OUTPATIENT_CLINIC_OR_DEPARTMENT_OTHER): Admitting: Oncology

## 2024-06-08 ENCOUNTER — Other Ambulatory Visit (HOSPITAL_BASED_OUTPATIENT_CLINIC_OR_DEPARTMENT_OTHER): Payer: Self-pay

## 2024-06-08 VITALS — BP 113/49 | HR 76 | Temp 97.5°F | Resp 16 | Wt 125.2 lb

## 2024-06-08 DIAGNOSIS — N185 Chronic kidney disease, stage 5: Secondary | ICD-10-CM

## 2024-06-08 DIAGNOSIS — D631 Anemia in chronic kidney disease: Secondary | ICD-10-CM | POA: Diagnosis not present

## 2024-06-08 DIAGNOSIS — D649 Anemia, unspecified: Secondary | ICD-10-CM

## 2024-06-08 DIAGNOSIS — R768 Other specified abnormal immunological findings in serum: Secondary | ICD-10-CM

## 2024-06-08 DIAGNOSIS — E538 Deficiency of other specified B group vitamins: Secondary | ICD-10-CM

## 2024-06-08 DIAGNOSIS — E1122 Type 2 diabetes mellitus with diabetic chronic kidney disease: Secondary | ICD-10-CM | POA: Diagnosis not present

## 2024-06-08 LAB — CBC WITH DIFFERENTIAL/PLATELET
Abs Immature Granulocytes: 0.02 K/uL (ref 0.00–0.07)
Basophils Absolute: 0.1 K/uL (ref 0.0–0.1)
Basophils Relative: 1 %
Eosinophils Absolute: 0.4 K/uL (ref 0.0–0.5)
Eosinophils Relative: 5 %
HCT: 28.7 % — ABNORMAL LOW (ref 36.0–46.0)
Hemoglobin: 9.3 g/dL — ABNORMAL LOW (ref 12.0–15.0)
Immature Granulocytes: 0 %
Lymphocytes Relative: 34 %
Lymphs Abs: 2.9 K/uL (ref 0.7–4.0)
MCH: 31.5 pg (ref 26.0–34.0)
MCHC: 32.4 g/dL (ref 30.0–36.0)
MCV: 97.3 fL (ref 80.0–100.0)
Monocytes Absolute: 0.8 K/uL (ref 0.1–1.0)
Monocytes Relative: 9 %
Neutro Abs: 4.4 K/uL (ref 1.7–7.7)
Neutrophils Relative %: 51 %
Platelets: 292 K/uL (ref 150–400)
RBC: 2.95 MIL/uL — ABNORMAL LOW (ref 3.87–5.11)
RDW: 12.4 % (ref 11.5–15.5)
WBC: 8.5 K/uL (ref 4.0–10.5)
nRBC: 0 % (ref 0.0–0.2)

## 2024-06-08 MED ORDER — VITAMIN B-12 1000 MCG PO TABS
1000.0000 ug | ORAL_TABLET | Freq: Every day | ORAL | 2 refills | Status: DC
  Filled 2024-06-08: qty 90, 90d supply, fill #0

## 2024-06-08 MED ORDER — EPOETIN ALFA-EPBX 10000 UNIT/ML IJ SOLN
10000.0000 [IU] | Freq: Once | INTRAMUSCULAR | Status: AC
  Administered 2024-06-08: 10000 [IU] via SUBCUTANEOUS
  Filled 2024-06-08: qty 1

## 2024-06-08 MED ORDER — PROMETHAZINE HCL 25 MG PO TABS
25.0000 mg | ORAL_TABLET | Freq: Four times a day (QID) | ORAL | 0 refills | Status: DC
  Filled 2024-06-08 – 2024-06-14 (×2): qty 30, 8d supply, fill #0

## 2024-06-08 NOTE — Patient Instructions (Signed)
 [facility] at [facility] Discharge Instructions   You were seen and examined today by Dr. Davonna.  She reviewed the results of your lab work which are normal/stable.   We will proceed with your injection today.   Return as scheduled.    Thank you for choosing [facility] at [facility] to provide your oncology and hematology care.  To afford each patient quality time with our provider, please arrive at least 15 minutes before your scheduled appointment time.   If you have a lab appointment with the Cancer Center please come in thru the Main Entrance and check in at the main information desk.  You need to re-schedule your appointment should you arrive 10 or more minutes late.  We strive to give you quality time with our providers, and arriving late affects you and other patients whose appointments are after yours.  Also, if you no show three or more times for appointments you may be dismissed from the clinic at the providers discretion.     Again, thank you for choosing [facility].  Our hope is that these requests will decrease the amount of time that you wait before being seen by our physicians.       _____________________________________________________________  Should you have questions after your visit to [facility], please contact our office at [phone] and follow the prompts.  Our office hours are 8:00 a.m. and 4:30 p.m. Monday - Friday.  Please note that voicemails left after 4:00 p.m. may not be returned until the following business day.  We are closed weekends and major holidays.  You do have access to a nurse 24-7, just call the main number to the clinic [phone] and do not press any options, hold on the line and a nurse will answer the phone.    For prescription refill requests, have your pharmacy contact our office and allow 72 hours.    Due to Covid, you will need to wear a mask upon entering the  hospital. If you do not have a mask, a mask will be given to you at the Main Entrance upon arrival. For doctor visits, patients may have 1 support person age 33 or older with them. For treatment visits, patients can not have anyone with them due to social distancing guidelines and our immunocompromised population.

## 2024-06-08 NOTE — Progress Notes (Signed)
 Patient tolerated injection with no complaints voiced.  Site clean and dry with no bruising or swelling noted at site.  See MAR for details.  Band aid applied.  Patient stable during and after injection.  Vss with discharge and left in satisfactory condition with no s/s of distress noted.

## 2024-06-09 ENCOUNTER — Other Ambulatory Visit (HOSPITAL_BASED_OUTPATIENT_CLINIC_OR_DEPARTMENT_OTHER): Payer: Self-pay

## 2024-06-12 ENCOUNTER — Other Ambulatory Visit (HOSPITAL_BASED_OUTPATIENT_CLINIC_OR_DEPARTMENT_OTHER): Payer: Self-pay

## 2024-06-12 ENCOUNTER — Other Ambulatory Visit: Payer: Self-pay

## 2024-06-13 ENCOUNTER — Ambulatory Visit (INDEPENDENT_AMBULATORY_CARE_PROVIDER_SITE_OTHER): Admitting: Gastroenterology

## 2024-06-14 ENCOUNTER — Other Ambulatory Visit (HOSPITAL_BASED_OUTPATIENT_CLINIC_OR_DEPARTMENT_OTHER): Payer: Self-pay

## 2024-06-14 ENCOUNTER — Encounter: Payer: Self-pay | Admitting: Oncology

## 2024-06-15 ENCOUNTER — Other Ambulatory Visit (HOSPITAL_BASED_OUTPATIENT_CLINIC_OR_DEPARTMENT_OTHER): Payer: Self-pay

## 2024-06-15 ENCOUNTER — Encounter: Payer: Self-pay | Admitting: Oncology

## 2024-06-15 DIAGNOSIS — N185 Chronic kidney disease, stage 5: Secondary | ICD-10-CM | POA: Insufficient documentation

## 2024-06-16 ENCOUNTER — Inpatient Hospital Stay: Attending: Hematology

## 2024-06-16 ENCOUNTER — Other Ambulatory Visit (HOSPITAL_BASED_OUTPATIENT_CLINIC_OR_DEPARTMENT_OTHER): Payer: Self-pay

## 2024-06-16 VITALS — BP 144/83 | HR 66 | Temp 97.2°F | Resp 18

## 2024-06-16 DIAGNOSIS — E1122 Type 2 diabetes mellitus with diabetic chronic kidney disease: Secondary | ICD-10-CM | POA: Insufficient documentation

## 2024-06-16 DIAGNOSIS — D509 Iron deficiency anemia, unspecified: Secondary | ICD-10-CM | POA: Insufficient documentation

## 2024-06-16 DIAGNOSIS — N185 Chronic kidney disease, stage 5: Secondary | ICD-10-CM | POA: Diagnosis not present

## 2024-06-16 DIAGNOSIS — Z992 Dependence on renal dialysis: Secondary | ICD-10-CM | POA: Insufficient documentation

## 2024-06-16 DIAGNOSIS — D649 Anemia, unspecified: Secondary | ICD-10-CM

## 2024-06-16 DIAGNOSIS — R634 Abnormal weight loss: Secondary | ICD-10-CM | POA: Insufficient documentation

## 2024-06-16 DIAGNOSIS — D631 Anemia in chronic kidney disease: Secondary | ICD-10-CM | POA: Insufficient documentation

## 2024-06-16 MED ORDER — SODIUM CHLORIDE 0.9 % IV SOLN
1000.0000 mg | Freq: Once | INTRAVENOUS | Status: AC
  Administered 2024-06-16: 1000 mg via INTRAVENOUS
  Filled 2024-06-16: qty 1000

## 2024-06-16 MED ORDER — CETIRIZINE HCL 10 MG/ML IV SOLN
10.0000 mg | Freq: Once | INTRAVENOUS | Status: AC
  Administered 2024-06-16: 10 mg via INTRAVENOUS
  Filled 2024-06-16: qty 1

## 2024-06-16 MED ORDER — PROMETHAZINE HCL 25 MG PO TABS
25.0000 mg | ORAL_TABLET | Freq: Four times a day (QID) | ORAL | 0 refills | Status: DC
  Filled 2024-06-16 – 2024-06-17 (×2): qty 30, 8d supply, fill #0

## 2024-06-16 MED ORDER — FAMOTIDINE IN NACL 20-0.9 MG/50ML-% IV SOLN
20.0000 mg | Freq: Once | INTRAVENOUS | Status: AC
  Administered 2024-06-16: 20 mg via INTRAVENOUS
  Filled 2024-06-16: qty 50

## 2024-06-16 MED ORDER — ACETAMINOPHEN 325 MG PO TABS
650.0000 mg | ORAL_TABLET | Freq: Once | ORAL | Status: AC
  Administered 2024-06-16: 650 mg via ORAL
  Filled 2024-06-16: qty 2

## 2024-06-16 MED ORDER — SODIUM CHLORIDE 0.9 % IV SOLN
INTRAVENOUS | Status: DC
Start: 2024-06-16 — End: 2024-06-16

## 2024-06-16 NOTE — Progress Notes (Signed)
Patient tolerated iron infusion well with no complaints voiced.  Patient left ambulatory in stable condition.  Vital signs stable at discharge.  Follow up as scheduled.

## 2024-06-16 NOTE — Patient Instructions (Signed)
 [facility] [place] - A DEPT OF [facility]  Discharge Instructions: Thank you for choosing [facility] to provide your oncology and hematology care.  If you have a lab appointment with the Cancer Center - please note that after April 8th, 2024, all labs will be drawn in the cancer center.  You do not have to check in or register with the main entrance as you have in the past but will complete your check-in in the cancer center.  Wear comfortable clothing and clothing appropriate for easy access to any Portacath or PICC line.   We strive to give you quality time with your provider. You may need to reschedule your appointment if you arrive late (15 or more minutes).  Arriving late affects you and other patients whose appointments are after yours.  Also, if you miss three or more appointments without notifying the office, you may be dismissed from the clinic at the provider's discretion.      For prescription refill requests, have your pharmacy contact our office and allow 72 hours for refills to be completed.    Today you received the following:  Monoferric.  Ferric Derisomaltose Injection What is this medication? FERRIC DERISOMALTOSE (FER ik der EYE soe MAWL tose) treats low levels of iron in your body (iron deficiency anemia). Iron is a mineral that plays an important role in making red blood cells, which carry oxygen from your lungs to the rest of your body. This medicine may be used for other purposes; ask your health care provider or pharmacist if you have questions. COMMON BRAND NAME(S): MONOFERRIC What should I tell my care team before I take this medication? They need to know if you have any of these conditions: High levels of iron in the blood An unusual or allergic reaction to iron, other medications, foods, dyes, or preservatives Pregnant or trying to get pregnant Breastfeeding How should I use this medication? This medication is injected into  a vein. It is given by your care team in a hospital or clinic setting. Talk to your care team about the use of this medication in children. Special care may be needed. Overdosage: If you think you have taken too much of this medicine contact a poison control center or emergency room at once. NOTE: This medicine is only for you. Do not share this medicine with others. What if I miss a dose? It is important not to miss your dose. Call your care team if you are unable to keep an appointment. What may interact with this medication? Do not take this medication with any of the following: Deferoxamine Dimercaprol Other iron products This list may not describe all possible interactions. Give your health care provider a list of all the medicines, herbs, non-prescription drugs, or dietary supplements you use. Also tell them if you smoke, drink alcohol, or use illegal drugs. Some items may interact with your medicine. What should I watch for while using this medication? Visit your care team for regular checks on your progress. Tell your care team if your symptoms do not start to get better or if they get worse. You may need blood work done while you are taking this medication. You may need to eat more foods that contain iron. Talk to your care team. Foods that contain iron include whole grains or cereals, dried fruits, beans, peas, leafy green vegetables, and organ meats (liver, kidney). What side effects may I notice from receiving this medication? Side  effects that you should report to your care team as soon as possible: Allergic reactions--skin rash, itching, hives, swelling of the face, lips, tongue, or throat Low blood pressure--dizziness, feeling faint or lightheaded, blurry vision Shortness of breath Side effects that usually do not require medical attention (report to your care team if they continue or are bothersome): Flushing Headache Joint pain Muscle pain Nausea Pain, redness, or  irritation at injection site This list may not describe all possible side effects. Call your doctor for medical advice about side effects. You may report side effects to FDA at 1-800-FDA-1088. Where should I keep my medication? This medication is given in a hospital or clinic. It will not be stored at home. NOTE: This sheet is a summary. It may not cover all possible information. If you have questions about this medicine, talk to your doctor, pharmacist, or health care provider.  2024 Elsevier/Gold Standard (2023-05-19 00:00:00)       To help prevent nausea and vomiting after your treatment, we encourage you to take your nausea medication as directed.  BELOW ARE SYMPTOMS THAT SHOULD BE REPORTED IMMEDIATELY: *FEVER GREATER THAN 100.4 F (38 C) OR HIGHER *CHILLS OR SWEATING *NAUSEA AND VOMITING THAT IS NOT CONTROLLED WITH YOUR NAUSEA MEDICATION *UNUSUAL SHORTNESS OF BREATH *UNUSUAL BRUISING OR BLEEDING *URINARY PROBLEMS (pain or burning when urinating, or frequent urination) *BOWEL PROBLEMS (unusual diarrhea, constipation, pain near the anus) TENDERNESS IN MOUTH AND THROAT WITH OR WITHOUT PRESENCE OF ULCERS (sore throat, sores in mouth, or a toothache) UNUSUAL RASH, SWELLING OR PAIN  UNUSUAL VAGINAL DISCHARGE OR ITCHING   Items with * indicate a potential emergency and should be followed up as soon as possible or go to the Emergency Department if any problems should occur.  Please show the CHEMOTHERAPY ALERT CARD or IMMUNOTHERAPY ALERT CARD at check-in to the Emergency Department and triage nurse.  Should you have questions after your visit or need to cancel or reschedule your appointment, please contact [facility] [place] - A DEPT OF [facility] [phone]  and follow the prompts.  Office hours are 8:00 a.m. to 4:30 p.m. Monday - Friday. Please note that voicemails left after 4:00 p.m. may not be returned until the following business day.  We are closed weekends  and major holidays. You have access to a nurse at all times for urgent questions. Please call the main number to the clinic [phone] and follow the prompts.  For any non-urgent questions, you may also contact your provider using MyChart. We now offer e-Visits for anyone 70 and older to request care online for non-urgent symptoms. For details visit [email or web address].   Also download the MyChart app! Go to the app store, search "MyChart", open the app, select [place], and log in with your MyChart username and password.

## 2024-06-17 ENCOUNTER — Other Ambulatory Visit (HOSPITAL_BASED_OUTPATIENT_CLINIC_OR_DEPARTMENT_OTHER): Payer: Self-pay

## 2024-06-17 ENCOUNTER — Encounter: Payer: Self-pay | Admitting: Oncology

## 2024-06-17 ENCOUNTER — Other Ambulatory Visit: Payer: Self-pay

## 2024-06-19 ENCOUNTER — Other Ambulatory Visit: Payer: Self-pay

## 2024-06-19 ENCOUNTER — Encounter (INDEPENDENT_AMBULATORY_CARE_PROVIDER_SITE_OTHER): Payer: Self-pay | Admitting: Gastroenterology

## 2024-06-19 ENCOUNTER — Other Ambulatory Visit (HOSPITAL_BASED_OUTPATIENT_CLINIC_OR_DEPARTMENT_OTHER): Payer: Self-pay

## 2024-06-19 ENCOUNTER — Ambulatory Visit (INDEPENDENT_AMBULATORY_CARE_PROVIDER_SITE_OTHER): Admitting: Gastroenterology

## 2024-06-19 VITALS — BP 171/81 | HR 83 | Temp 97.1°F | Ht 62.0 in | Wt 120.2 lb

## 2024-06-19 DIAGNOSIS — D500 Iron deficiency anemia secondary to blood loss (chronic): Secondary | ICD-10-CM

## 2024-06-19 DIAGNOSIS — R110 Nausea: Secondary | ICD-10-CM | POA: Diagnosis not present

## 2024-06-19 DIAGNOSIS — E1122 Type 2 diabetes mellitus with diabetic chronic kidney disease: Secondary | ICD-10-CM | POA: Diagnosis not present

## 2024-06-19 DIAGNOSIS — D126 Benign neoplasm of colon, unspecified: Secondary | ICD-10-CM | POA: Diagnosis not present

## 2024-06-19 DIAGNOSIS — Z23 Encounter for immunization: Secondary | ICD-10-CM | POA: Diagnosis not present

## 2024-06-19 DIAGNOSIS — I10 Essential (primary) hypertension: Secondary | ICD-10-CM

## 2024-06-19 DIAGNOSIS — D509 Iron deficiency anemia, unspecified: Secondary | ICD-10-CM

## 2024-06-19 DIAGNOSIS — D3501 Benign neoplasm of right adrenal gland: Secondary | ICD-10-CM | POA: Diagnosis not present

## 2024-06-19 DIAGNOSIS — E039 Hypothyroidism, unspecified: Secondary | ICD-10-CM | POA: Diagnosis not present

## 2024-06-19 DIAGNOSIS — F330 Major depressive disorder, recurrent, mild: Secondary | ICD-10-CM | POA: Diagnosis not present

## 2024-06-19 DIAGNOSIS — N1832 Chronic kidney disease, stage 3b: Secondary | ICD-10-CM | POA: Diagnosis not present

## 2024-06-19 DIAGNOSIS — I2510 Atherosclerotic heart disease of native coronary artery without angina pectoris: Secondary | ICD-10-CM | POA: Diagnosis not present

## 2024-06-19 DIAGNOSIS — R634 Abnormal weight loss: Secondary | ICD-10-CM

## 2024-06-19 DIAGNOSIS — G3184 Mild cognitive impairment, so stated: Secondary | ICD-10-CM | POA: Diagnosis not present

## 2024-06-19 DIAGNOSIS — N3281 Overactive bladder: Secondary | ICD-10-CM | POA: Diagnosis not present

## 2024-06-19 MED ORDER — PANTOPRAZOLE SODIUM 40 MG PO TBEC
40.0000 mg | DELAYED_RELEASE_TABLET | Freq: Every day | ORAL | 1 refills | Status: AC
  Filled 2024-06-19 – 2024-10-19 (×2): qty 30, 30d supply, fill #0

## 2024-06-19 NOTE — Patient Instructions (Signed)
 It was very nice to meet you today, as dicussed with will plan for the following :  1) Blood work with your next labs work next week  2) continue seeing hematology

## 2024-06-19 NOTE — Progress Notes (Signed)
 Marcellius Montagna Faizan Lilias Lorensen , M.D. Gastroenterology & Hepatology [facility] [address] [place], [place] [address] Primary Care Physician: Nichole Senior, MD [address] [place] [address]  Chief Complaint: Nausea, anemia, history of advanced Ta's  History of Present Illness:  [name] is a 78 y.o. female with  history of diabetes, hyperlipidemia and hypertension,  who presents for follow-up Nausea, Iron deficiency anemia, history of advanced Ta's   Patient had a negative CTA abdomen, recent EUS without any gastric varices or anything concerning.  Also recent labs with evidence of iron deficiency with ferritin 20 . She had underwent upper endoscopy found to have a 5 cm hiatal hernia and congested mucosa in the cardia not biopsied done as inpatient followed up with colonoscopy 11/2023 found to have 8 polyps 2 of them large tubular adenoma removed piecemeal fashion.  Patient continues to have intermittent nausea mostly in the morning unrelated to any food intake.  She was taking PPI daily and reports it does help some; she recently ran out. patient has been taking iron supplementation every other day  Ferritin : 55 ----> 20  %sat: 32 Vitamin B12 : 357 Folate : 15.1 Copper  : 81  Last EGD:10/14/2023 - 5 cm hiatal hernia.  - Congested and texture changed mucosa in the cardia. This was not biopsied as isolated gastric varices could not be excluded  - Erythematous mucosa in the antrum. Biopsied.  - Normal duodenal bulb and second portion of the duodenum.   A. STOMACH, RANDOM, BIOPSY:  Gastric mucosa with hyperemia.  Negative for Helicobacter pylori.  Negative for dysplasia or malignancy.  Negative EUS 04/2024  EGD impression: - No gross lesions in the entire esophagus. - Widely patent Schatzki ring. - Z- line irregular, 36 cm from the incisors. - 3 cm hiatal hernia. - No gross lesions in the entire stomach. - No gross lesions in the  duodenal bulb, in the first portion of the duodenum and in the second portion of the duodenum.  EUS impression: - There was no sign of significant pathology in the thoracic esophagus and in the gastroesophageal junction. No evidence of varicose veins. - Endosonographic images of the stomach were unremarkable. No evidence of gastric varices noted in the cardia/ fundus. - No malignant- appearing lymph nodes were visualized in the paracardial region ( level 16) , left gastric region ( level 17) and celiac region ( level 20) . Last Colonoscopy:  11/2023  - One 12 mm polyp in the cecum, removed with mucosal resection. Resected and retrieved. This polyp was sponatneously oozing and could explain iron deficiency anemia - One 30 mm polyp in the ascending colon, removed with mucosal resection. Resected and retrieved. Treated with a hot snare. Clip ( MR conditional) was placed. Clip manufacturer: [employer]. - Six 4 to 9 mm polyps in the sigmoid colon, in the transverse colon and in the ascending colon, removed with a cold snare. Resected and retrieved. - Diverticulosis in the left colon. - Non- bleeding external and internal hemorrhoids. - Mucosal resection was performed. Resection and retrieval were complete. - Mucosal resection was performed. Resection and retrieval were complete.  A. COLON, SIGMOID, ASCENDING, TRANSVERSE, POLYPECTOMY:  Tubular adenoma (s) without high grade dysplasia.   B. CECAL, POLYPECTOMY:  Multiple fragments of inflammatory polyp.   C. COLON, ASCENDING, POLYPECTOMY:  Tubular adenoma (s) without high grade dysplasia.   2019 Dr. Rosalie (GI) in [place], last EGD and colonoscopy in 2019. EGD was found to  have inflammation at the GE junction. 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 The only other finding was presbyesophagus.  Colonoscopy was reported to have an adenoma and was recommended to have a repeat colonoscopy in 2024.   FHx: neg for any  gastrointestinal/liver disease, no malignancies Social: neg smoking, alcohol or illicit drug use Surgical: no abdominal surgeries  Past Medical History: Past Medical History:  Diagnosis Date   Cancer (HCC) 1983   Breast Cancer   Diabetes mellitus without complication (HCC)    High cholesterol    Hypertension    Hypothyroidism    Overactive bladder    PONV (postoperative nausea and vomiting)     Past Surgical History: Past Surgical History:  Procedure Laterality Date   ABDOMINAL HYSTERECTOMY     BIOPSY  10/14/2023   Procedure: BIOPSY;  Surgeon: Cinderella [name], MD;  [facility];  Service: Endoscopy;;   COLONOSCOPY WITH PROPOFOL  N/A 11/25/2023   Procedure: COLONOSCOPY WITH PROPOFOL ;  Surgeon: Cinderella [name], MD;  [facility];  Service: Endoscopy;  Laterality: N/A;  10:00AM;ASA 3   ESOPHAGOGASTRODUODENOSCOPY N/A 05/11/2024   Procedure: EGD (ESOPHAGOGASTRODUODENOSCOPY);  Surgeon: Wilhelmenia Aloha Raddle., MD;  Location: [facility];  Service: Gastroenterology;  Laterality: N/A;   ESOPHAGOGASTRODUODENOSCOPY (EGD) WITH PROPOFOL  N/A 10/14/2023   Procedure: ESOPHAGOGASTRODUODENOSCOPY (EGD) WITH PROPOFOL ;  Surgeon: Cinderella [name], MD;  [facility];  Service: Endoscopy;  Laterality: N/A;   EUS N/A 05/11/2024   Procedure: ULTRASOUND, UPPER GI TRACT, ENDOSCOPIC;  Surgeon: Wilhelmenia Aloha Raddle., MD;  Location: [facility];  Service: Gastroenterology;  Laterality: N/A;   FOOT SURGERY     HEMOSTASIS CLIP PLACEMENT  11/25/2023   Procedure: HEMOSTASIS CLIP PLACEMENT;  Surgeon: Cinderella [name], MD;  [facility];  Service: Endoscopy;;   POLYPECTOMY  11/25/2023   Procedure: POLYPECTOMY;  Surgeon: Cinderella [name], MD;  [facility];  Service: Endoscopy;;  hot and cold   SCLEROTHERAPY  11/25/2023   Procedure: SCLEROTHERAPY;  Surgeon: Cinderella [name], MD;  [facility];  Service: Endoscopy;;   SUBMUCOSAL LIFTING INJECTION   11/25/2023   Procedure: SUBMUCOSAL LIFTING INJECTION;  Surgeon: Cinderella [name], MD;  [facility];  Service: Endoscopy;;    Family History: Family History  Problem Relation Age of Onset   Heart failure Mother        67   Brain cancer Father     Social History: Social History   Tobacco Use  Smoking Status Never  Smokeless Tobacco Never   Social History   Substance and Sexual Activity  Alcohol Use Never   Social History   Substance and Sexual Activity  Drug Use Never    Allergies: No Known Allergies  Medications: Current Outpatient Medications  Medication Sig Dispense Refill   ACCU-CHEK AVIVA PLUS test strip 1 each by Other route 4 (four) times daily.   5   ACCU-CHEK FASTCLIX LANCETS MISC U TO TEST QID  10   Accu-Chek FastClix Lancets MISC Use to check blood glucose 3 times daily as directed 102 each 11   amLODipine  (NORVASC ) 10 MG tablet Take 10 mg by mouth daily.     aspirin  81 MG chewable tablet Chew 1 tablet (81 mg total) by mouth daily.     carvedilol  (COREG ) 12.5 MG tablet Take 1 tablet (12.5 mg total) by mouth 2 (two) times daily. 180 tablet 3   cloNIDine  (CATAPRES ) 0.2 MG tablet Take 1 tablet (0.2 mg  total) by mouth 2 (two) times daily. 180 tablet 3   cyanocobalamin  (VITAMIN B12) 1000 MCG tablet Take 1 tablet (1,000 mcg total) by mouth daily. 90 tablet 2   donepezil  (ARICEPT ) 5 MG tablet Take 1 tablet (5 mg total) by mouth daily. 90 tablet 1   escitalopram  (LEXAPRO ) 10 MG tablet Take 10 mg by mouth daily.   1   ferrous sulfate  325 (65 FE) MG EC tablet Take 1 tablet (325 mg total) by mouth every other day. 60 tablet 0   fesoterodine  (TOVIAZ ) 8 MG TB24 tablet Take 1 tablet (8 mg total) by mouth daily. 30 tablet 11   glucose blood test strip Use to check blood sugar 4 times daily 100 each 3   hydrochlorothiazide  (HYDRODIURIL ) 25 MG tablet Take 1 tablet (25 mg total) by mouth daily. 90 tablet 2   JANUMET  50-1000 MG tablet Take 1 tablet by mouth 2  (two) times daily.     Lancets Misc. (ACCU-CHEK FASTCLIX LANCET) KIT See admin instructions.     levothyroxine  (SYNTHROID , LEVOTHROID) 100 MCG tablet Take 100 mcg by mouth daily.   3   olmesartan  (BENICAR ) 40 MG tablet Take 40 mg by mouth daily.     ondansetron  (ZOFRAN ) 4 MG tablet Take 4 mg by mouth every 8 (eight) hours as needed for nausea or vomiting.     pantoprazole  (PROTONIX ) 40 MG tablet Take 1 tablet (40 mg total) by mouth daily at 6 (six) AM. 30 tablet 1   PREVIDENT 5000 DRY MOUTH 1.1 % GEL dental gel Place 1 Application onto teeth at bedtime.     simvastatin  (ZOCOR ) 40 MG tablet Take 1 tablet (40 mg total) by mouth daily. 90 tablet 2   sitaGLIPtin -metformin  (JANUMET ) 50-1000 MG tablet Take 1 tablet by mouth 2 (two) times daily. 180 tablet 2   Sodium Fluoride  1.1 % PSTE Apply 1 drop to the teeth, brush for 2 minutes, then spit. No eating, drinking, or rinsing for 30 minutes 100 mL 12   amLODipine  (NORVASC ) 10 MG tablet Take 1 tablet (10 mg total) by mouth daily. 90 tablet 3   amLODipine  (NORVASC ) 5 MG tablet Take 1 tablet (5 mg total) by mouth daily. (Patient not taking: Reported on 06/19/2024) 90 tablet 3   polyethylene glycol (MIRALAX  / GLYCOLAX ) 17 g packet Take 17 g by mouth 2 (two) times daily. (Patient not taking: Reported on 06/19/2024) 180 each 0   No current facility-administered medications for this visit.    Review of Systems: GENERAL: negative for malaise, night sweats HEENT: No changes in hearing or vision, no nose bleeds or other nasal problems. NECK: Negative for lumps, goiter, pain and significant neck swelling RESPIRATORY: Negative for cough, wheezing CARDIOVASCULAR: Negative for chest pain, leg swelling, palpitations, orthopnea GI: SEE HPI MUSCULOSKELETAL: Negative for joint pain or swelling, back pain, and muscle pain. SKIN: Negative for lesions, rash HEMATOLOGY Negative for prolonged bleeding, bruising easily, and swollen nodes. ENDOCRINE: Negative for cold or  heat intolerance, polyuria, polydipsia and goiter. NEURO: negative for tremor, gait imbalance, syncope and seizures. The remainder of the review of systems is noncontributory.   Physical Exam: BP (!) 171/81 (BP Location: Left Arm, Patient Position: Sitting, Cuff Size: Normal)   Pulse 83   Temp (!) 97.1 F (36.2 C) (Temporal)   Ht 5' 2 (1.575 m)   Wt 120 lb 3.2 oz (54.5 kg)   BMI 21.98 kg/m  GENERAL: The patient is AO x3, in no acute distress. HEENT: Head is normocephalic  and atraumatic. EOMI are intact. Mouth is well hydrated and without lesions. NECK: Supple. No masses LUNGS: Clear to auscultation. No presence of rhonchi/wheezing/rales. Adequate chest expansion HEART: RRR, normal s1 and s2. ABDOMEN: Soft, nontender, no guarding, no peritoneal signs, and nondistended. BS +. No masses.  Imaging/Labs: as above     Latest Ref Rng & Units 06/08/2024    9:00 AM 05/25/2024   10:00 AM 05/10/2024    9:37 AM  CBC  WBC 4.0 - 10.5 K/uL 8.5  12.1  13.4   Hemoglobin 12.0 - 15.0 g/dL 9.3  88.8  89.5   Hematocrit 36.0 - 46.0 % 28.7  34.2  32.9   Platelets 150 - 400 K/uL 292  328  301    Lab Results  Component Value Date   IRON 56 05/10/2024   TIBC 363 05/10/2024   FERRITIN 20 05/10/2024    I personally reviewed and interpreted the available labs, imaging and endoscopic files.  01/05  CT of the abdomen and pelvis without IV contrast showed extensive atherosclerosis but no other abnormalities.   CTA Abdomen 11/2023  IMPRESSION: 1. No acute findings. 2. No evidence of mesenteric ischemia. 3. Small hiatal hernia. 4. Sigmoid diverticulosis. 5.  Aortic Atherosclerosis (ICD10-I70.0).  GES  FINDINGS: Expected location of the stomach in the left upper quadrant. Ingested meal empties the stomach gradually over the course of the study.   28% emptied at 1 hr ( normal >= 10%)   75% emptied at 2 hr ( normal >= 40%)   83% emptied at 3 hr ( normal >= 70%)   91% emptied at 4 hr (  normal >= 90%)   IMPRESSION: No scintigraphic evidence of delayed gastric emptying.  Impression and Plan:  DECLYN OFFIELD is a 78 y.o. female with  history of diabetes, hyperlipidemia and hypertension,  who presents for follow-up Nausea, Iron deficiency anemia, history of advanced Ta's   #Iron deficiency anemia #Surveillance colonoscopy #Nausea   Patient had a negative CTA abdomen, recent EUS without any gastric varices or anything concerning.  Also recent labs with evidence of iron deficiency with ferritin 20 . She had underwent upper endoscopy found to have a 5 cm hiatal hernia and congested mucosa in the cardia not biopsied done as inpatient followed up with colonoscopy 11/2023 found to have 8 polyps 2 of them large tubular adenoma removed piecemeal fashion.  Ferritin 55----> 20   Repeat surveillance colonoscopy November 2025  Given persistent iron deficiency would recommend capsule endoscopy but patient would like to defer for now and continue with iron infusions, can readdress again depending on infusion response   Patient had upper endoscopy and colonoscopy February 2025 followed up with CT angio abdomen pelvis without any overt malignancy  Restart Protonix  Will check morning ACTH levels given persistent nausea   #HTN  The patient was found to have elevated blood pressure when vital signs were checked in the office. The blood pressure was rechecked by the nursing staff and it was found be persistently elevated >140/90 mmHg. I personally advised to the patient to follow up closely with PCP for hypertension control.    All questions were answered.      Macrae Wiegman Faizan Nicholous Girgenti, MD Gastroenterology and Hepatology [facility]   This chart has been completed using [facility] Dictation software, and while attempts have been made to ensure accuracy , certain words and phrases may not be transcribed as intended

## 2024-06-20 ENCOUNTER — Other Ambulatory Visit: Payer: Self-pay

## 2024-06-20 ENCOUNTER — Other Ambulatory Visit (HOSPITAL_BASED_OUTPATIENT_CLINIC_OR_DEPARTMENT_OTHER): Payer: Self-pay

## 2024-06-20 DIAGNOSIS — D649 Anemia, unspecified: Secondary | ICD-10-CM

## 2024-06-20 MED ORDER — PANTOPRAZOLE SODIUM 40 MG PO TBEC
40.0000 mg | DELAYED_RELEASE_TABLET | Freq: Every day | ORAL | 3 refills | Status: DC
  Filled 2024-06-20: qty 90, 90d supply, fill #0

## 2024-06-21 ENCOUNTER — Inpatient Hospital Stay

## 2024-06-21 ENCOUNTER — Other Ambulatory Visit (HOSPITAL_COMMUNITY)
Admission: RE | Admit: 2024-06-21 | Discharge: 2024-06-21 | Disposition: A | Discharge status: Home / Self Care | Source: Ambulatory Visit | Attending: Gastroenterology | Admitting: Gastroenterology

## 2024-06-21 VITALS — BP 169/63 | HR 64 | Temp 96.9°F | Resp 16

## 2024-06-21 DIAGNOSIS — R634 Abnormal weight loss: Secondary | ICD-10-CM | POA: Insufficient documentation

## 2024-06-21 DIAGNOSIS — D509 Iron deficiency anemia, unspecified: Secondary | ICD-10-CM | POA: Diagnosis not present

## 2024-06-21 DIAGNOSIS — Z992 Dependence on renal dialysis: Secondary | ICD-10-CM | POA: Diagnosis not present

## 2024-06-21 DIAGNOSIS — E1122 Type 2 diabetes mellitus with diabetic chronic kidney disease: Secondary | ICD-10-CM | POA: Diagnosis not present

## 2024-06-21 DIAGNOSIS — D649 Anemia, unspecified: Secondary | ICD-10-CM

## 2024-06-21 DIAGNOSIS — N185 Chronic kidney disease, stage 5: Secondary | ICD-10-CM

## 2024-06-21 DIAGNOSIS — D631 Anemia in chronic kidney disease: Secondary | ICD-10-CM | POA: Diagnosis not present

## 2024-06-21 LAB — CBC WITH DIFFERENTIAL/PLATELET
Abs Immature Granulocytes: 0.03 K/uL (ref 0.00–0.07)
Basophils Absolute: 0.1 K/uL (ref 0.0–0.1)
Basophils Relative: 1 %
Eosinophils Absolute: 0.3 K/uL (ref 0.0–0.5)
Eosinophils Relative: 3 %
HCT: 30.9 % — ABNORMAL LOW (ref 36.0–46.0)
Hemoglobin: 10.1 g/dL — ABNORMAL LOW (ref 12.0–15.0)
Immature Granulocytes: 0 %
Lymphocytes Relative: 18 %
Lymphs Abs: 1.8 K/uL (ref 0.7–4.0)
MCH: 31.3 pg (ref 26.0–34.0)
MCHC: 32.7 g/dL (ref 30.0–36.0)
MCV: 95.7 fL (ref 80.0–100.0)
Monocytes Absolute: 1 K/uL (ref 0.1–1.0)
Monocytes Relative: 10 %
Neutro Abs: 6.8 K/uL (ref 1.7–7.7)
Neutrophils Relative %: 68 %
Platelets: 284 K/uL (ref 150–400)
RBC: 3.23 MIL/uL — ABNORMAL LOW (ref 3.87–5.11)
RDW: 12.8 % (ref 11.5–15.5)
WBC: 10 K/uL (ref 4.0–10.5)
nRBC: 0 % (ref 0.0–0.2)

## 2024-06-21 MED ORDER — EPOETIN ALFA-EPBX 10000 UNIT/ML IJ SOLN
10000.0000 [IU] | Freq: Once | INTRAMUSCULAR | Status: AC
  Administered 2024-06-21: 10000 [IU] via SUBCUTANEOUS
  Filled 2024-06-21: qty 1

## 2024-06-21 NOTE — Progress Notes (Signed)
 Retacrit injection given per orders. Patient tolerated it well without problems. Vitals stable and discharged home from clinic ambulatory. Follow up as scheduled.

## 2024-06-21 NOTE — Patient Instructions (Signed)
 [facility]  Discharge Instructions: Thank you for choosing [facility] to provide your oncology and hematology care.  If you have a lab appointment with the Cancer Center - please note that after April 8th, 2024, all labs will be drawn in the cancer center.  You do not have to check in or register with the main entrance as you have in the past but will complete your check-in in the cancer center.  Wear comfortable clothing and clothing appropriate for easy access to any Portacath or PICC line.   We strive to give you quality time with your provider. You may need to reschedule your appointment if you arrive late (15 or more minutes).  Arriving late affects you and other patients whose appointments are after yours.  Also, if you miss three or more appointments without notifying the office, you may be dismissed from the clinic at the provider's discretion.      For prescription refill requests, have your pharmacy contact our office and allow 72 hours for refills to be completed.    Today you received the following injection Retacrit   To help prevent nausea and vomiting after your treatment, we encourage you to take your nausea medication as directed.  BELOW ARE SYMPTOMS THAT SHOULD BE REPORTED IMMEDIATELY: *FEVER GREATER THAN 100.4 F (38 C) OR HIGHER *CHILLS OR SWEATING *NAUSEA AND VOMITING THAT IS NOT CONTROLLED WITH YOUR NAUSEA MEDICATION *UNUSUAL SHORTNESS OF BREATH *UNUSUAL BRUISING OR BLEEDING *URINARY PROBLEMS (pain or burning when urinating, or frequent urination) *BOWEL PROBLEMS (unusual diarrhea, constipation, pain near the anus) TENDERNESS IN MOUTH AND THROAT WITH OR WITHOUT PRESENCE OF ULCERS (sore throat, sores in mouth, or a toothache) UNUSUAL RASH, SWELLING OR PAIN  UNUSUAL VAGINAL DISCHARGE OR ITCHING   Items with * indicate a potential emergency and should be followed up as soon as possible or go to the  Emergency Department if any problems should occur.  Please show the CHEMOTHERAPY ALERT CARD or IMMUNOTHERAPY ALERT CARD at check-in to the Emergency Department and triage nurse.  Should you have questions after your visit or need to cancel or reschedule your appointment, please contact [facility] [place] - A DEPT OF Eligha Brideg[facility] [phone]  and follow the prompts.  Office hours are 8:00 a.m. to 4:30 p.m. Monday - Friday. Please note that voicemails left after 4:00 p.m. may not be returned until the following business day.  We are closed weekends and major holidays. You have access to a nurse at all times for urgent questions. Please call the main number to the clinic [phone] and follow the prompts.  For any non-urgent questions, you may also contact your provider using MyChart. We now offer e-Visits for anyone 61 and older to request care online for non-urgent symptoms. For details visit [email or web address].   Also download the MyChart app! Go to the app store, search "MyChart", open the app, select [place], and log in with your MyChart username and password.

## 2024-06-21 NOTE — Progress Notes (Signed)
 Office Visit    Patient Name: [name] Date of Encounter: 06/22/2024  Primary Care Provider:  Nichole Senior, MD Primary Cardiologist:  None  Chief Complaint    Hypertension  Significant Past Medical History   HLD LDL on simvastatin  40 mg  DM2 12/24 A1c 7.1 on Janumet     No Known Allergies  History of Present Illness    [name] is a 77 y.o. female patient of Dr Lavona, in the office today for hypertension evaluation.   Dr. Lavona last saw her in March, at which time her BP was 160/70.   She had an abdominal CT that showed normal adrenal glands and some calcified plaque in the left renal artery, with mild stenosis.  He increased her clonidine  to 0.2 mg bid.  When I saw her in July pressure was still elevated (136/64) and I switched metoprolol  to carvedilol .    Today she returns for follow up.  Had no concerns about switching metoprolol  to carvedilol .  She states she is doing fine, but her daughter pointed out that patient complains of dry mouth all the time.  Currently on clonidine  0.2 mg bid.    Blood Pressure Goal:  130/80  Current Medications:  amlodipine  10 mg daily( pm), carvedilol  12.5 bid, olmesartan  40 mg daily (unsure), clonidine  0.2 mg bid  Social Hx:      Tobacco: no  Alcohol: no  Caffeine: diet Dr. Nunzio 16 oz per day, occasionally 2  Diet:    doesn't eat much meat, will eat occasionally beef, fish or chicken - often fried; some vegetables and beans; snacks on ice cream  Exercise: no  Home BP readings:  wrist cuff several years old, not at heart level; she brings it in today (Equate, wrist monitor) and it reads within 10 points of office reading  Average of last 14 days is 143/74, improved from last visit 14 day average of 160/90  (Range 123-162/68-90)  Adherence Assessment  Do you ever forget to take your medication? [x] Yes - occasionally [] No  Do you ever skip doses due to side effects? [] Yes [x] No  Do you have trouble affording your  medicines? [] Yes [x] No  Are you ever unable to pick up your medication due to transportation difficulties? [] Yes [x] No - but frustrated with repeated calls from pharmacy    Accessory Clinical Findings    Lab Results  Component Value Date   CREATININE 1.94 (H) 05/10/2024   BUN 34 (H) 05/10/2024   NA 138 05/10/2024   K 4.2 05/10/2024   CL 98 05/10/2024   CO2 26 05/10/2024   Lab Results  Component Value Date   ALT 12 05/10/2024   AST 16 05/10/2024   ALKPHOS 43 05/10/2024   BILITOT 0.7 05/10/2024   Lab Results  Component Value Date   HGBA1C 7.1 (H) 10/12/2023    Home Medications    Current Outpatient Medications  Medication Sig Dispense Refill   cloNIDine  (CATAPRES  - DOSED IN MG/24 HR) 0.2 mg/24hr patch Place 1 patch (0.2 mg total) onto the skin once a week. 4 patch 12   ACCU-CHEK AVIVA PLUS test strip 1 each by Other route 4 (four) times daily.   5   ACCU-CHEK FASTCLIX LANCETS MISC U TO TEST QID  10   Accu-Chek FastClix Lancets MISC Use to check blood glucose 3 times daily as directed 102 each 11   amLODipine  (NORVASC ) 10 MG tablet Take 10 mg by mouth daily.     amLODipine  (NORVASC ) 10 MG  tablet Take 1 tablet (10 mg total) by mouth daily. 90 tablet 3   amLODipine  (NORVASC ) 5 MG tablet Take 1 tablet (5 mg total) by mouth daily. 90 tablet 3   aspirin  81 MG chewable tablet Chew 1 tablet (81 mg total) by mouth daily.     carvedilol  (COREG ) 12.5 MG tablet Take 1 tablet (12.5 mg total) by mouth 2 (two) times daily. 180 tablet 3   cyanocobalamin  (VITAMIN B12) 1000 MCG tablet Take 1 tablet (1,000 mcg total) by mouth daily. 90 tablet 2   donepezil  (ARICEPT ) 5 MG tablet Take 1 tablet (5 mg total) by mouth daily. 90 tablet 1   escitalopram  (LEXAPRO ) 10 MG tablet Take 10 mg by mouth daily.   1   ferrous sulfate  325 (65 FE) MG EC tablet Take 1 tablet (325 mg total) by mouth every other day. 60 tablet 0   fesoterodine  (TOVIAZ ) 8 MG TB24 tablet Take 1 tablet (8 mg total) by mouth daily.  30 tablet 11   glucose blood test strip Use to check blood sugar 4 times daily 100 each 3   JANUMET  50-1000 MG tablet Take 1 tablet by mouth 2 (two) times daily.     Lancets Misc. (ACCU-CHEK FASTCLIX LANCET) KIT See admin instructions.     levothyroxine  (SYNTHROID , LEVOTHROID) 100 MCG tablet Take 100 mcg by mouth daily.   3   olmesartan  (BENICAR ) 40 MG tablet Take 40 mg by mouth daily.     ondansetron  (ZOFRAN ) 4 MG tablet Take 4 mg by mouth every 8 (eight) hours as needed for nausea or vomiting.     pantoprazole  (PROTONIX ) 40 MG tablet Take 1 tablet (40 mg total) by mouth daily at 6 (six) AM. 30 tablet 1   pantoprazole  (PROTONIX ) 40 MG tablet Take 1 tablet (40 mg total) by mouth daily. 100 tablet 3   polyethylene glycol (MIRALAX  / GLYCOLAX ) 17 g packet Take 17 g by mouth 2 (two) times daily. 180 each 0   PREVIDENT 5000 DRY MOUTH 1.1 % GEL dental gel Place 1 Application onto teeth at bedtime.     simvastatin  (ZOCOR ) 40 MG tablet Take 1 tablet (40 mg total) by mouth daily. 90 tablet 2   sitaGLIPtin -metformin  (JANUMET ) 50-1000 MG tablet Take 1 tablet by mouth 2 (two) times daily. 180 tablet 2   Sodium Fluoride  1.1 % PSTE Apply 1 drop to the teeth, brush for 2 minutes, then spit. No eating, drinking, or rinsing for 30 minutes 100 mL 12   No current facility-administered medications for this visit.     HYPERTENSION CONTROL Vitals:   06/22/24 1106  BP: (!) 156/68    The patient's blood pressure is elevated above target today.  In order to address the patient's elevated BP: A current anti-hypertensive medication was adjusted today.; Blood pressure will be monitored at home to determine if medication changes need to be made.      Assessment & Plan    Essential hypertension Assessment: BP is uncontrolled in office BP 142/66 mmHg;  above the goal (<130/80). Dry mouth - probably related to clonidine  Tolerates amlodipine , olmesartan  and carvedilol  well, without any side effects Denies SOB,  palpitation, chest pain, headaches,or swelling Reiterated the importance of regular exercise and low salt diet   Plan:  Start taking clonidine  0.2 mg patch, once weekly, starting tomorrow morning Take normal dose of clonidine  tablets tomorrow (0.2 mg bid), then take 0.1 mg bid x 1 day then 0.1 mg in the am on day 3 then  stop.   Continue taking amlodipine  10 mg daily(pm), carvedilol  12.5 bid, olmesartan  40 mg daily  Patient to keep record of BP readings with heart rate and report to us  at the next visit Patient to follow up with Caitlin in November  Labs ordered today: none    Allean Mink PharmD CPP [facility]  [address]th floor [place], [place] [address] [phone]

## 2024-06-22 ENCOUNTER — Other Ambulatory Visit (HOSPITAL_BASED_OUTPATIENT_CLINIC_OR_DEPARTMENT_OTHER): Payer: Self-pay

## 2024-06-22 ENCOUNTER — Encounter (HOSPITAL_BASED_OUTPATIENT_CLINIC_OR_DEPARTMENT_OTHER): Payer: Self-pay | Admitting: Pharmacist Clinician (PhC)/ Clinical Pharmacy Specialist

## 2024-06-22 ENCOUNTER — Ambulatory Visit (HOSPITAL_BASED_OUTPATIENT_CLINIC_OR_DEPARTMENT_OTHER): Admitting: Pharmacist Clinician (PhC)/ Clinical Pharmacy Specialist

## 2024-06-22 VITALS — BP 156/68 | Wt 117.3 lb

## 2024-06-22 DIAGNOSIS — I10 Essential (primary) hypertension: Secondary | ICD-10-CM

## 2024-06-22 MED ORDER — CLONIDINE 0.2 MG/24HR TD PTWK
0.2000 mg | MEDICATED_PATCH | TRANSDERMAL | 12 refills | Status: DC
  Filled 2024-06-22: qty 4, 28d supply, fill #0
  Filled 2024-07-17 – 2024-07-18 (×3): qty 4, 28d supply, fill #1
  Filled 2024-08-16: qty 4, 28d supply, fill #2

## 2024-06-22 NOTE — Patient Instructions (Addendum)
 Follow up appointment: with [facility] in November - [facility]  Take your BP meds as follows:  START CLONIDINE  0.2 MG PATCHES TOMORROW (fRIDAY) MORNING - CHANGE PATCH ONCE WEEKLY  FRIDAY TAKE YOUR NORMAL DOSE OF CLONIDINE  - 0.2 MG MORNING AND NIGHT  SATURDAY TAKE 1/2 TABLET OF CLONIDINE  MORNING AND NIGHT  SUNDAY TAKE 1/2 TABLET OF CLONIDINE  IN THE MORNING.  THEN NO MORE TABLETS   CONTINUE WITH OLMESARTAN , AMLODIPINE  AND CARVEDILOL   Check your blood pressure at home daily (if able) and keep record of the readings.  MyChart User ID:  [id number]   Your blood pressure goal is < 130/80  To check your pressure at home you will need to:  1. Sit up in a chair, with feet flat on the floor and back supported. Do not cross your ankles or legs. 2. Rest your left arm so that the cuff is about heart level. If the cuff goes on your upper arm,  then just relax the arm on the table, arm of the chair or your lap. If you have a wrist cuff, we  suggest relaxing your wrist against your chest (think of it as Pledging the Flag with the  wrong arm).  3. Place the cuff snugly around your arm, about 1 inch above the crook of your elbow. The  cords should be inside the groove of your elbow.  4. Sit quietly, with the cuff in place, for about 5 minutes. After that 5 minutes press the power  button to start a reading. 5. Do not talk or move while the reading is taking place.  6. Record your readings on a sheet of paper. Although most cuffs have a memory, it is often  easier to see a pattern developing when the numbers are all in front of you.  7. You can repeat the reading after 1-3 minutes if it is recommended  Make sure your bladder is empty and you have not had caffeine or tobacco within the last 30 min  Always bring your blood pressure log with you to your appointments. If you have not brought your monitor in to be double checked for accuracy, please bring it to your next  appointment.  You can find a list of quality blood pressure cuffs at [email or web address]  Important lifestyle changes to control high blood pressure  Intervention  Effect on the BP  Lose extra pounds and watch your waistline Weight loss is one of the most effective lifestyle changes for controlling blood pressure. If you're overweight or obese, losing even a small amount of weight can help reduce blood pressure. Blood pressure might go down by about 1 millimeter of mercury (mm Hg) with each kilogram (about 2.2 pounds) of weight lost.  Exercise regularly As a general goal, aim for at least 30 minutes of moderate physical activity every day. Regular physical activity can lower high blood pressure by about 5 to 8 mm Hg.  Eat a healthy diet Eating a diet rich in whole grains, fruits, vegetables, and low-fat dairy products and low in saturated fat and cholesterol. A healthy diet can lower high blood pressure by up to 11 mm Hg.  Reduce salt (sodium) in your diet Even a small reduction of sodium in the diet can improve heart health and reduce high blood pressure by about 5 to 6 mm Hg.  Limit alcohol One drink equals 12 ounces of beer, 5 ounces of wine, or 1.5 ounces of 80-proof liquor.  Limiting alcohol to less than  one drink a day for women or two drinks a day for men can help lower blood pressure by about 4 mm Hg.   If you have any questions or concerns please use My Chart to send questions or call the office at [phone]

## 2024-06-22 NOTE — Assessment & Plan Note (Signed)
 Assessment: BP is uncontrolled in office BP 142/66 mmHg;  above the goal (<130/80). Dry mouth - probably related to clonidine  Tolerates amlodipine , olmesartan  and carvedilol  well, without any side effects Denies SOB, palpitation, chest pain, headaches,or swelling Reiterated the importance of regular exercise and low salt diet   Plan:  Start taking clonidine  0.2 mg patch, once weekly, starting tomorrow morning Take normal dose of clonidine  tablets tomorrow (0.2 mg bid), then take 0.1 mg bid x 1 day then 0.1 mg in the am on day 3 then stop.   Continue taking amlodipine  10 mg daily(pm), carvedilol  12.5 bid, olmesartan  40 mg daily  Patient to keep record of BP readings with heart rate and report to us  at the next visit Patient to follow up with Caitlin in November  Labs ordered today: none

## 2024-06-23 LAB — GASTRIN: Gastrin: 28 pg/mL (ref 0–115)

## 2024-06-26 ENCOUNTER — Other Ambulatory Visit (HOSPITAL_BASED_OUTPATIENT_CLINIC_OR_DEPARTMENT_OTHER): Payer: Self-pay

## 2024-06-26 MED ORDER — ESCITALOPRAM OXALATE 10 MG PO TABS
10.0000 mg | ORAL_TABLET | Freq: Every day | ORAL | 3 refills | Status: DC
  Filled 2024-06-26: qty 90, 90d supply, fill #0

## 2024-06-27 ENCOUNTER — Ambulatory Visit (INDEPENDENT_AMBULATORY_CARE_PROVIDER_SITE_OTHER): Payer: Self-pay | Admitting: Gastroenterology

## 2024-06-27 NOTE — Progress Notes (Signed)
 Normal gastrin levels

## 2024-07-04 ENCOUNTER — Other Ambulatory Visit (HOSPITAL_BASED_OUTPATIENT_CLINIC_OR_DEPARTMENT_OTHER): Payer: Self-pay

## 2024-07-05 ENCOUNTER — Other Ambulatory Visit (HOSPITAL_BASED_OUTPATIENT_CLINIC_OR_DEPARTMENT_OTHER): Payer: Self-pay

## 2024-07-05 ENCOUNTER — Other Ambulatory Visit: Payer: Self-pay

## 2024-07-05 DIAGNOSIS — D649 Anemia, unspecified: Secondary | ICD-10-CM

## 2024-07-06 ENCOUNTER — Inpatient Hospital Stay

## 2024-07-06 DIAGNOSIS — E1122 Type 2 diabetes mellitus with diabetic chronic kidney disease: Secondary | ICD-10-CM | POA: Diagnosis not present

## 2024-07-06 DIAGNOSIS — Z992 Dependence on renal dialysis: Secondary | ICD-10-CM | POA: Diagnosis not present

## 2024-07-06 DIAGNOSIS — D631 Anemia in chronic kidney disease: Secondary | ICD-10-CM | POA: Diagnosis not present

## 2024-07-06 DIAGNOSIS — D509 Iron deficiency anemia, unspecified: Secondary | ICD-10-CM | POA: Diagnosis not present

## 2024-07-06 DIAGNOSIS — N185 Chronic kidney disease, stage 5: Secondary | ICD-10-CM | POA: Diagnosis not present

## 2024-07-06 DIAGNOSIS — R634 Abnormal weight loss: Secondary | ICD-10-CM | POA: Diagnosis not present

## 2024-07-06 DIAGNOSIS — D649 Anemia, unspecified: Secondary | ICD-10-CM

## 2024-07-06 LAB — CBC WITH DIFFERENTIAL/PLATELET
Abs Immature Granulocytes: 0.01 K/uL (ref 0.00–0.07)
Basophils Absolute: 0.1 K/uL (ref 0.0–0.1)
Basophils Relative: 1 %
Eosinophils Absolute: 0.3 K/uL (ref 0.0–0.5)
Eosinophils Relative: 4 %
HCT: 34.4 % — ABNORMAL LOW (ref 36.0–46.0)
Hemoglobin: 11.2 g/dL — ABNORMAL LOW (ref 12.0–15.0)
Immature Granulocytes: 0 %
Lymphocytes Relative: 28 %
Lymphs Abs: 2.5 K/uL (ref 0.7–4.0)
MCH: 31.6 pg (ref 26.0–34.0)
MCHC: 32.6 g/dL (ref 30.0–36.0)
MCV: 97.2 fL (ref 80.0–100.0)
Monocytes Absolute: 0.8 K/uL (ref 0.1–1.0)
Monocytes Relative: 9 %
Neutro Abs: 5.2 K/uL (ref 1.7–7.7)
Neutrophils Relative %: 58 %
Platelets: 264 K/uL (ref 150–400)
RBC: 3.54 MIL/uL — ABNORMAL LOW (ref 3.87–5.11)
RDW: 13.4 % (ref 11.5–15.5)
WBC: 8.8 K/uL (ref 4.0–10.5)
nRBC: 0 % (ref 0.0–0.2)

## 2024-07-06 NOTE — Progress Notes (Signed)
 Patient's Hgb 11.2. Per parameters pt does not need Retacrit  injection today. Pt updated and agrees to plan. All follow ups as scheduled.   [name]

## 2024-07-12 ENCOUNTER — Other Ambulatory Visit (HOSPITAL_BASED_OUTPATIENT_CLINIC_OR_DEPARTMENT_OTHER): Payer: Self-pay

## 2024-07-13 ENCOUNTER — Other Ambulatory Visit (HOSPITAL_BASED_OUTPATIENT_CLINIC_OR_DEPARTMENT_OTHER): Payer: Self-pay

## 2024-07-18 ENCOUNTER — Other Ambulatory Visit (HOSPITAL_BASED_OUTPATIENT_CLINIC_OR_DEPARTMENT_OTHER): Payer: Self-pay

## 2024-07-18 ENCOUNTER — Other Ambulatory Visit: Payer: Self-pay

## 2024-07-19 ENCOUNTER — Other Ambulatory Visit: Payer: Self-pay

## 2024-07-19 DIAGNOSIS — D649 Anemia, unspecified: Secondary | ICD-10-CM

## 2024-07-20 ENCOUNTER — Inpatient Hospital Stay

## 2024-07-20 ENCOUNTER — Inpatient Hospital Stay: Attending: Hematology

## 2024-07-20 DIAGNOSIS — N185 Chronic kidney disease, stage 5: Secondary | ICD-10-CM | POA: Diagnosis not present

## 2024-07-20 DIAGNOSIS — Z990 Dependence on aspirator: Secondary | ICD-10-CM | POA: Insufficient documentation

## 2024-07-20 DIAGNOSIS — D649 Anemia, unspecified: Secondary | ICD-10-CM

## 2024-07-20 DIAGNOSIS — E1122 Type 2 diabetes mellitus with diabetic chronic kidney disease: Secondary | ICD-10-CM | POA: Diagnosis not present

## 2024-07-20 DIAGNOSIS — D631 Anemia in chronic kidney disease: Secondary | ICD-10-CM | POA: Insufficient documentation

## 2024-07-20 LAB — CBC WITH DIFFERENTIAL/PLATELET
Abs Immature Granulocytes: 0.05 K/uL (ref 0.00–0.07)
Basophils Absolute: 0.1 K/uL (ref 0.0–0.1)
Basophils Relative: 1 %
Eosinophils Absolute: 1 K/uL — ABNORMAL HIGH (ref 0.0–0.5)
Eosinophils Relative: 8 %
HCT: 36.4 % (ref 36.0–46.0)
Hemoglobin: 12 g/dL (ref 12.0–15.0)
Immature Granulocytes: 0 %
Lymphocytes Relative: 23 %
Lymphs Abs: 2.8 K/uL (ref 0.7–4.0)
MCH: 31.6 pg (ref 26.0–34.0)
MCHC: 33 g/dL (ref 30.0–36.0)
MCV: 95.8 fL (ref 80.0–100.0)
Monocytes Absolute: 0.9 K/uL (ref 0.1–1.0)
Monocytes Relative: 7 %
Neutro Abs: 7.4 K/uL (ref 1.7–7.7)
Neutrophils Relative %: 61 %
Platelets: 297 K/uL (ref 150–400)
RBC: 3.8 MIL/uL — ABNORMAL LOW (ref 3.87–5.11)
RDW: 13.1 % (ref 11.5–15.5)
WBC: 12.1 K/uL — ABNORMAL HIGH (ref 4.0–10.5)
nRBC: 0 % (ref 0.0–0.2)

## 2024-07-20 NOTE — Progress Notes (Signed)
 Pt.'s Hgb 12. Per parameters pt does not need Retacrit  shot today. Pt updated and agrees to plan. All follow ups as scheduled.   Konner Warrior

## 2024-07-21 DIAGNOSIS — Z79899 Other long term (current) drug therapy: Secondary | ICD-10-CM | POA: Diagnosis not present

## 2024-07-21 DIAGNOSIS — N178 Other acute kidney failure: Secondary | ICD-10-CM | POA: Diagnosis not present

## 2024-07-21 DIAGNOSIS — D631 Anemia in chronic kidney disease: Secondary | ICD-10-CM | POA: Diagnosis not present

## 2024-07-21 DIAGNOSIS — Z940 Kidney transplant status: Secondary | ICD-10-CM | POA: Diagnosis not present

## 2024-07-21 DIAGNOSIS — I10 Essential (primary) hypertension: Secondary | ICD-10-CM | POA: Diagnosis not present

## 2024-07-21 DIAGNOSIS — N186 End stage renal disease: Secondary | ICD-10-CM | POA: Diagnosis not present

## 2024-07-23 ENCOUNTER — Ambulatory Visit: Payer: Self-pay | Admitting: Oncology

## 2024-07-26 ENCOUNTER — Other Ambulatory Visit (HOSPITAL_BASED_OUTPATIENT_CLINIC_OR_DEPARTMENT_OTHER): Payer: Self-pay

## 2024-07-27 ENCOUNTER — Other Ambulatory Visit (HOSPITAL_BASED_OUTPATIENT_CLINIC_OR_DEPARTMENT_OTHER): Payer: Self-pay

## 2024-07-27 ENCOUNTER — Encounter (INDEPENDENT_AMBULATORY_CARE_PROVIDER_SITE_OTHER): Payer: Self-pay | Admitting: *Deleted

## 2024-07-27 DIAGNOSIS — N1832 Chronic kidney disease, stage 3b: Secondary | ICD-10-CM | POA: Diagnosis not present

## 2024-07-27 DIAGNOSIS — I10 Essential (primary) hypertension: Secondary | ICD-10-CM | POA: Diagnosis not present

## 2024-07-27 DIAGNOSIS — R634 Abnormal weight loss: Secondary | ICD-10-CM | POA: Diagnosis not present

## 2024-07-31 ENCOUNTER — Other Ambulatory Visit: Payer: Self-pay

## 2024-07-31 ENCOUNTER — Other Ambulatory Visit (HOSPITAL_BASED_OUTPATIENT_CLINIC_OR_DEPARTMENT_OTHER): Payer: Self-pay

## 2024-08-02 ENCOUNTER — Other Ambulatory Visit: Payer: Self-pay

## 2024-08-02 DIAGNOSIS — D649 Anemia, unspecified: Secondary | ICD-10-CM

## 2024-08-03 ENCOUNTER — Other Ambulatory Visit (HOSPITAL_BASED_OUTPATIENT_CLINIC_OR_DEPARTMENT_OTHER): Payer: Self-pay

## 2024-08-03 ENCOUNTER — Inpatient Hospital Stay

## 2024-08-03 VITALS — BP 118/60 | HR 57 | Temp 97.6°F | Resp 16

## 2024-08-03 DIAGNOSIS — N185 Chronic kidney disease, stage 5: Secondary | ICD-10-CM | POA: Diagnosis not present

## 2024-08-03 DIAGNOSIS — E1122 Type 2 diabetes mellitus with diabetic chronic kidney disease: Secondary | ICD-10-CM | POA: Diagnosis not present

## 2024-08-03 DIAGNOSIS — D649 Anemia, unspecified: Secondary | ICD-10-CM

## 2024-08-03 DIAGNOSIS — D631 Anemia in chronic kidney disease: Secondary | ICD-10-CM | POA: Diagnosis not present

## 2024-08-03 DIAGNOSIS — Z990 Dependence on aspirator: Secondary | ICD-10-CM | POA: Diagnosis not present

## 2024-08-03 LAB — CBC WITH DIFFERENTIAL/PLATELET
Abs Immature Granulocytes: 0.03 K/uL (ref 0.00–0.07)
Basophils Absolute: 0.1 K/uL (ref 0.0–0.1)
Basophils Relative: 1 %
Eosinophils Absolute: 0.5 K/uL (ref 0.0–0.5)
Eosinophils Relative: 6 %
HCT: 33.2 % — ABNORMAL LOW (ref 36.0–46.0)
Hemoglobin: 10.6 g/dL — ABNORMAL LOW (ref 12.0–15.0)
Immature Granulocytes: 0 %
Lymphocytes Relative: 26 %
Lymphs Abs: 2.5 K/uL (ref 0.7–4.0)
MCH: 31.3 pg (ref 26.0–34.0)
MCHC: 31.9 g/dL (ref 30.0–36.0)
MCV: 97.9 fL (ref 80.0–100.0)
Monocytes Absolute: 0.6 K/uL (ref 0.1–1.0)
Monocytes Relative: 7 %
Neutro Abs: 5.7 K/uL (ref 1.7–7.7)
Neutrophils Relative %: 60 %
Platelets: 292 K/uL (ref 150–400)
RBC: 3.39 MIL/uL — ABNORMAL LOW (ref 3.87–5.11)
RDW: 12.9 % (ref 11.5–15.5)
WBC: 9.4 K/uL (ref 4.0–10.5)
nRBC: 0 % (ref 0.0–0.2)

## 2024-08-03 MED ORDER — EPOETIN ALFA-EPBX 10000 UNIT/ML IJ SOLN
10000.0000 [IU] | Freq: Once | INTRAMUSCULAR | Status: AC
  Administered 2024-08-03: 10000 [IU] via SUBCUTANEOUS
  Filled 2024-08-03: qty 1

## 2024-08-03 NOTE — Progress Notes (Signed)
 Patient's Hgb 10.6 and blood pressure stable. Patient tolerated Retacrit injection with no complaints voiced.  Site clean and dry with no bruising or swelling noted at site.  See MAR for details.  Band aid applied.  Patient stable during and after injection.  Vss with discharge and left in satisfactory condition with no s/s of distress noted. All follow ups as scheduled.   Tijuan Dantes [employer]

## 2024-08-03 NOTE — Patient Instructions (Signed)

## 2024-08-14 ENCOUNTER — Other Ambulatory Visit (HOSPITAL_BASED_OUTPATIENT_CLINIC_OR_DEPARTMENT_OTHER): Payer: Self-pay

## 2024-08-16 ENCOUNTER — Other Ambulatory Visit: Payer: Self-pay

## 2024-08-17 ENCOUNTER — Inpatient Hospital Stay: Attending: Hematology

## 2024-08-17 ENCOUNTER — Inpatient Hospital Stay

## 2024-08-17 DIAGNOSIS — E1122 Type 2 diabetes mellitus with diabetic chronic kidney disease: Secondary | ICD-10-CM | POA: Diagnosis not present

## 2024-08-17 DIAGNOSIS — D631 Anemia in chronic kidney disease: Secondary | ICD-10-CM | POA: Diagnosis not present

## 2024-08-17 DIAGNOSIS — N185 Chronic kidney disease, stage 5: Secondary | ICD-10-CM | POA: Insufficient documentation

## 2024-08-17 DIAGNOSIS — E538 Deficiency of other specified B group vitamins: Secondary | ICD-10-CM

## 2024-08-17 LAB — COMPREHENSIVE METABOLIC PANEL WITH GFR
ALT: 26 U/L (ref 0–44)
AST: 39 U/L (ref 15–41)
Albumin: 4.2 g/dL (ref 3.5–5.0)
Alkaline Phosphatase: 61 U/L (ref 38–126)
Anion gap: 13 (ref 5–15)
BUN: 20 mg/dL (ref 8–23)
CO2: 28 mmol/L (ref 22–32)
Calcium: 9.4 mg/dL (ref 8.9–10.3)
Chloride: 99 mmol/L (ref 98–111)
Creatinine, Ser: 1.51 mg/dL — ABNORMAL HIGH (ref 0.44–1.00)
GFR, Estimated: 35 mL/min — ABNORMAL LOW (ref >60)
Glucose, Bld: 168 mg/dL — ABNORMAL HIGH (ref 70–99)
Potassium: 3.8 mmol/L (ref 3.5–5.1)
Sodium: 140 mmol/L (ref 135–145)
Total Bilirubin: 0.3 mg/dL (ref 0.0–1.2)
Total Protein: 6.5 g/dL (ref 6.5–8.1)

## 2024-08-17 LAB — CBC WITH DIFFERENTIAL/PLATELET
Abs Immature Granulocytes: 0.02 K/uL (ref 0.00–0.07)
Basophils Absolute: 0.1 K/uL (ref 0.0–0.1)
Basophils Relative: 1 %
Eosinophils Absolute: 0.4 K/uL (ref 0.0–0.5)
Eosinophils Relative: 5 %
HCT: 36 % (ref 36.0–46.0)
Hemoglobin: 11.8 g/dL — ABNORMAL LOW (ref 12.0–15.0)
Immature Granulocytes: 0 %
Lymphocytes Relative: 26 %
Lymphs Abs: 1.9 K/uL (ref 0.7–4.0)
MCH: 32.2 pg (ref 26.0–34.0)
MCHC: 32.8 g/dL (ref 30.0–36.0)
MCV: 98.1 fL (ref 80.0–100.0)
Monocytes Absolute: 0.5 K/uL (ref 0.1–1.0)
Monocytes Relative: 7 %
Neutro Abs: 4.3 K/uL (ref 1.7–7.7)
Neutrophils Relative %: 61 %
Platelets: 273 K/uL (ref 150–400)
RBC: 3.67 MIL/uL — ABNORMAL LOW (ref 3.87–5.11)
RDW: 13.4 % (ref 11.5–15.5)
WBC: 7.1 K/uL (ref 4.0–10.5)
nRBC: 0 % (ref 0.0–0.2)

## 2024-08-17 LAB — FERRITIN: Ferritin: 371 ng/mL — ABNORMAL HIGH (ref 11–307)

## 2024-08-17 LAB — FOLATE: Folate: 6.8 ng/mL (ref >5.9)

## 2024-08-17 LAB — IRON AND TIBC
Iron: 73 ug/dL (ref 28–170)
Saturation Ratios: 25 % (ref 10.4–31.8)
TIBC: 294 ug/dL (ref 250–450)
UIBC: 221 ug/dL

## 2024-08-17 LAB — VITAMIN B12: Vitamin B-12: 868 pg/mL (ref 180–914)

## 2024-08-17 NOTE — Progress Notes (Signed)
 Pt.'s Hgb 11.8. Per MD parameters pt does not need retacrit  shot today. Pt updated and all questions answered.  [name]

## 2024-08-18 ENCOUNTER — Other Ambulatory Visit: Payer: Self-pay

## 2024-08-21 ENCOUNTER — Other Ambulatory Visit (HOSPITAL_BASED_OUTPATIENT_CLINIC_OR_DEPARTMENT_OTHER): Payer: Self-pay

## 2024-08-25 ENCOUNTER — Other Ambulatory Visit (HOSPITAL_BASED_OUTPATIENT_CLINIC_OR_DEPARTMENT_OTHER): Payer: Self-pay

## 2024-08-25 MED ORDER — JANUMET 50-1000 MG PO TABS
1.0000 | ORAL_TABLET | Freq: Two times a day (BID) | ORAL | 0 refills | Status: DC
  Filled 2024-08-25 – 2024-08-28 (×2): qty 60, 30d supply, fill #0

## 2024-08-28 ENCOUNTER — Other Ambulatory Visit: Payer: Self-pay

## 2024-08-28 ENCOUNTER — Telehealth: Payer: Self-pay

## 2024-08-28 ENCOUNTER — Other Ambulatory Visit (HOSPITAL_BASED_OUTPATIENT_CLINIC_OR_DEPARTMENT_OTHER): Payer: Self-pay

## 2024-08-28 ENCOUNTER — Other Ambulatory Visit (HOSPITAL_BASED_OUTPATIENT_CLINIC_OR_DEPARTMENT_OTHER): Payer: Self-pay | Admitting: Cardiology

## 2024-08-28 DIAGNOSIS — Z853 Personal history of malignant neoplasm of breast: Secondary | ICD-10-CM | POA: Insufficient documentation

## 2024-08-28 DIAGNOSIS — G3184 Mild cognitive impairment, so stated: Secondary | ICD-10-CM | POA: Insufficient documentation

## 2024-08-28 NOTE — Telephone Encounter (Signed)
 Who is your primary care physician: [name]  Reasons for the colonoscopy: Hx polyps  Have you had a colonoscopy before?  Yes 11-25-23  Do you have family history of colon cancer? no  Previous colonoscopy with polyps removed? yes  Do you have a history colorectal cancer?   no  Are you diabetic? If yes, Type 1 or Type 2?    Yes type 2  Do you have a prosthetic or mechanical heart valve? no  Do you have a pacemaker/defibrillator?   no  Have you had endocarditis/atrial fibrillation? no  Have you had joint replacement within the last 12 months?  no  Do you tend to be constipated or have to use laxatives? no  Do you have any history of drugs or alchohol?  no  Do you use supplemental oxygen ?  no  Have you had a stroke or heart attack within the last 6 months? no  Do you take weight loss medication?  no  For female patients: have you had a hysterectomy?  yes                                     are you post menopausal?       yes                                            do you still have your menstrual cycle? no      Do you take any blood-thinning medications such as: (aspirin , warfarin, Plavix, Aggrenox)  yes  If yes we need the name, milligram, dosage and who is prescribing doctor ASA 81 mg Current Outpatient Medications on File Prior to Visit  Medication Sig Dispense Refill   ACCU-CHEK FASTCLIX LANCETS MISC U TO TEST QID  10   Accu-Chek FastClix Lancets MISC Use to check blood glucose 3 times daily as directed 102 each 11   aspirin  81 MG chewable tablet Chew 1 tablet (81 mg total) by mouth daily.     BOOSTRIX 5-2.5-18.5 LF-MCG/0.5 injection      donepezil  (ARICEPT ) 5 MG tablet Take 1 tablet (5 mg total) by mouth daily. 90 tablet 1   escitalopram  (LEXAPRO ) 10 MG tablet Take 10 mg by mouth daily.   1   fesoterodine  (TOVIAZ ) 8 MG TB24 tablet Take 1 tablet (8 mg total) by mouth daily. 30 tablet 11   glucose blood test strip Use to check blood sugar 4 times daily 100 each 3    JANUMET  50-1000 MG tablet Take 1 tablet by mouth 2 (two) times daily.     Lancets Misc. (ACCU-CHEK FASTCLIX LANCET) KIT See admin instructions.     levothyroxine  (SYNTHROID , LEVOTHROID) 100 MCG tablet Take 100 mcg by mouth daily.   3   Magnesium  400 MG CAPS Take by mouth.     olmesartan  (BENICAR ) 40 MG tablet Take 40 mg by mouth daily.     ondansetron  (ZOFRAN ) 4 MG tablet Take 4 mg by mouth every 8 (eight) hours as needed for nausea or vomiting.     pantoprazole  (PROTONIX ) 40 MG tablet Take 1 tablet (40 mg total) by mouth daily at 6 (six) AM. 30 tablet 1   PREVIDENT 5000 DRY MOUTH 1.1 % GEL dental gel Place 1 Application onto teeth at bedtime.     sitaGLIPtin -metformin  (JANUMET )  50-1000 MG tablet Take 1 tablet by mouth 2 (two) times daily. 60 tablet 0   No current facility-administered medications on file prior to visit.    No Known Allergies   Pharmacy: [facility]  Primary Insurance Name: Ursula [id number], CHARON [id number]  Best number where you can be reached: [phone]

## 2024-08-29 ENCOUNTER — Other Ambulatory Visit (HOSPITAL_BASED_OUTPATIENT_CLINIC_OR_DEPARTMENT_OTHER): Payer: Self-pay

## 2024-08-29 NOTE — Telephone Encounter (Signed)
Ok to schedule.  Room :Any   Thanks,  Vista Lawman, MD Gastroenterology and Hepatology [facility]

## 2024-08-30 ENCOUNTER — Other Ambulatory Visit (HOSPITAL_BASED_OUTPATIENT_CLINIC_OR_DEPARTMENT_OTHER): Payer: Self-pay

## 2024-08-30 ENCOUNTER — Other Ambulatory Visit: Payer: Self-pay

## 2024-08-30 DIAGNOSIS — D649 Anemia, unspecified: Secondary | ICD-10-CM

## 2024-08-30 NOTE — Telephone Encounter (Signed)
 LMOVM to call back

## 2024-08-31 ENCOUNTER — Encounter (HOSPITAL_BASED_OUTPATIENT_CLINIC_OR_DEPARTMENT_OTHER): Admitting: Family

## 2024-08-31 ENCOUNTER — Inpatient Hospital Stay

## 2024-08-31 VITALS — BP 115/63 | HR 66 | Temp 97.6°F | Resp 16

## 2024-08-31 DIAGNOSIS — D649 Anemia, unspecified: Secondary | ICD-10-CM

## 2024-08-31 DIAGNOSIS — D631 Anemia in chronic kidney disease: Secondary | ICD-10-CM | POA: Diagnosis not present

## 2024-08-31 DIAGNOSIS — E1122 Type 2 diabetes mellitus with diabetic chronic kidney disease: Secondary | ICD-10-CM | POA: Diagnosis not present

## 2024-08-31 DIAGNOSIS — N185 Chronic kidney disease, stage 5: Secondary | ICD-10-CM

## 2024-08-31 LAB — CBC WITH DIFFERENTIAL/PLATELET
Abs Immature Granulocytes: 0.03 K/uL (ref 0.00–0.07)
Basophils Absolute: 0.1 K/uL (ref 0.0–0.1)
Basophils Relative: 1 %
Eosinophils Absolute: 0.4 K/uL (ref 0.0–0.5)
Eosinophils Relative: 5 %
HCT: 32.8 % — ABNORMAL LOW (ref 36.0–46.0)
Hemoglobin: 10.7 g/dL — ABNORMAL LOW (ref 12.0–15.0)
Immature Granulocytes: 0 %
Lymphocytes Relative: 32 %
Lymphs Abs: 3 K/uL (ref 0.7–4.0)
MCH: 32 pg (ref 26.0–34.0)
MCHC: 32.6 g/dL (ref 30.0–36.0)
MCV: 98.2 fL (ref 80.0–100.0)
Monocytes Absolute: 0.7 K/uL (ref 0.1–1.0)
Monocytes Relative: 8 %
Neutro Abs: 5.1 K/uL (ref 1.7–7.7)
Neutrophils Relative %: 54 %
Platelets: 281 K/uL (ref 150–400)
RBC: 3.34 MIL/uL — ABNORMAL LOW (ref 3.87–5.11)
RDW: 13.1 % (ref 11.5–15.5)
WBC: 9.4 K/uL (ref 4.0–10.5)
nRBC: 0 % (ref 0.0–0.2)

## 2024-08-31 MED ORDER — EPOETIN ALFA-EPBX 10000 UNIT/ML IJ SOLN
10000.0000 [IU] | Freq: Once | INTRAMUSCULAR | Status: AC
  Administered 2024-08-31: 10000 [IU] via SUBCUTANEOUS
  Filled 2024-08-31: qty 1

## 2024-08-31 NOTE — Progress Notes (Signed)
 Patient's Hgb 10.7 and blood pressure stable. Patient tolerated Retacrit  injection with no complaints voiced.  Site clean and dry with no bruising or swelling noted at site.  See MAR for details.  Band aid applied.  Patient stable during and after injection.  Vss with discharge and left in satisfactory condition with no s/s of distress noted. All follow ups as scheduled.   [name]

## 2024-08-31 NOTE — Patient Instructions (Signed)

## 2024-09-05 ENCOUNTER — Other Ambulatory Visit: Payer: Self-pay | Admitting: *Deleted

## 2024-09-05 ENCOUNTER — Encounter: Payer: Self-pay | Admitting: *Deleted

## 2024-09-05 ENCOUNTER — Other Ambulatory Visit (HOSPITAL_BASED_OUTPATIENT_CLINIC_OR_DEPARTMENT_OTHER): Payer: Self-pay

## 2024-09-05 MED ORDER — PEG 3350-KCL-NA BICARB-NACL 420 G PO SOLR
4000.0000 mL | Freq: Once | ORAL | 0 refills | Status: AC
  Filled 2024-09-05: qty 4000, 1d supply, fill #0

## 2024-09-05 NOTE — Telephone Encounter (Signed)
 Pt has been scheduled for 12/31. Instructions mailed and prep sent to pharmacy.

## 2024-09-05 NOTE — Telephone Encounter (Signed)
 Questionnaire from recall, no referral needed

## 2024-09-14 ENCOUNTER — Inpatient Hospital Stay: Attending: Oncology | Admitting: Oncology

## 2024-09-14 ENCOUNTER — Other Ambulatory Visit: Payer: Self-pay

## 2024-09-14 ENCOUNTER — Inpatient Hospital Stay

## 2024-09-14 VITALS — BP 123/58 | HR 61 | Temp 97.9°F | Resp 18 | Ht 62.0 in | Wt 131.0 lb

## 2024-09-14 DIAGNOSIS — D649 Anemia, unspecified: Secondary | ICD-10-CM

## 2024-09-14 DIAGNOSIS — E538 Deficiency of other specified B group vitamins: Secondary | ICD-10-CM | POA: Insufficient documentation

## 2024-09-14 DIAGNOSIS — E1122 Type 2 diabetes mellitus with diabetic chronic kidney disease: Secondary | ICD-10-CM | POA: Diagnosis present

## 2024-09-14 DIAGNOSIS — N185 Chronic kidney disease, stage 5: Secondary | ICD-10-CM

## 2024-09-14 DIAGNOSIS — R7689 Other specified abnormal immunological findings in serum: Secondary | ICD-10-CM | POA: Diagnosis not present

## 2024-09-14 DIAGNOSIS — D631 Anemia in chronic kidney disease: Secondary | ICD-10-CM | POA: Diagnosis present

## 2024-09-14 LAB — CBC
HCT: 30.2 % — ABNORMAL LOW (ref 36.0–46.0)
Hemoglobin: 10.1 g/dL — ABNORMAL LOW (ref 12.0–15.0)
MCH: 32.9 pg (ref 26.0–34.0)
MCHC: 33.4 g/dL (ref 30.0–36.0)
MCV: 98.4 fL (ref 80.0–100.0)
Platelets: 244 K/uL (ref 150–400)
RBC: 3.07 MIL/uL — ABNORMAL LOW (ref 3.87–5.11)
RDW: 13.3 % (ref 11.5–15.5)
WBC: 7.2 K/uL (ref 4.0–10.5)
nRBC: 0 % (ref 0.0–0.2)

## 2024-09-14 MED ORDER — EPOETIN ALFA-EPBX 10000 UNIT/ML IJ SOLN
10000.0000 [IU] | Freq: Once | INTRAMUSCULAR | Status: AC
  Administered 2024-09-14: 10000 [IU] via SUBCUTANEOUS
  Filled 2024-09-14: qty 1

## 2024-09-14 NOTE — Assessment & Plan Note (Deleted)
 Normocytic anemia likely secondary to chronic kidney disease TSAT within goal of 25-30 SPEP: No M spike, slightly elevated free light chains but normal ratio -03/16/2024: Started on EPO shots every 2 weeks  -Labs reviewed today: TSAT: 15, ferritin: 20, B12: 229, CMP: Creatinine: 1.94, GFR: 26, CBC: Hemoglobin: 9.3, normal WBC and platelets - Will administer EPO shot today.  Will continue to check every 2 weeks and give if hemoglobin is less than 11 - Will also administer 1 dose of IV Monoferric  with a TSAT goal of 25-30 as long as ferritin is less than 400.  Discussed side effects of IV iron infusion including allergic reactions, nausea and headache.  Patient is symptomatic at this time and will benefit from quicker hematopoiesis. - Start taking oral iron every other day.  Use MiraLAX  for constipation -Continue to follow with nephrology  Return to clinic in 3 months to see if there is an improvement, if not we will increase the dose of EPO

## 2024-09-14 NOTE — Assessment & Plan Note (Signed)
 Normocytic anemia likely secondary to chronic kidney disease TSAT within goal of 25-30 SPEP: No M spike, slightly elevated free light chains but normal ratio -03/16/2024: Started on EPO shots every 2 weeks  -Labs reviewed today: Hemoglobin 10.1. - Reviewed labs from 08/17/2024 which showed hemoglobin 11.8 iron saturation 25%, ferritin 371 with normal TIBC. - Will administer EPO shot today.  Patient has required Depo shots about every month.  Will extend her out to every 3 weeks to see if we are able to maintain a hemoglobin between 10 and 11. -She received a dose of ferric - Will also administer 1 dose of Monoferric  on 06/16/2024 with good tolerance. -She does not need any additional iron infusions at this time. - Continue oral iron every other day.  Use MiraLAX  for constipation -Continue to follow with nephrology  Return to clinic in 3 months to see if there is an improvement, if not we will increase the dose of EPO

## 2024-09-14 NOTE — Patient Instructions (Signed)

## 2024-09-14 NOTE — Assessment & Plan Note (Addendum)
 Likely secondary to chronic kidney disease.  Normal ratio SPEP: No M spike  - No further workup necessary at this time

## 2024-09-14 NOTE — Progress Notes (Signed)
Patient presents today for Retacrit injection. Hemoglobin reviewed prior to administration. VSS tolerated without incident or complaint. See MAR for details. Patient stable during and after injection. Patient discharged in satisfactory condition with no s/s of distress noted.  

## 2024-09-14 NOTE — Progress Notes (Signed)
 [facility] at [facility]  HEMATOLOGY FOLLOW-UP VISIT  Emma Senior, MD  REASON FOR FOLLOW-UP: Anemia of chronic kidney disease  ASSESSMENT & PLAN:  Patient is a 78 y.o. female following for anemia of chronic kidney disease  Assessment & Plan Anemia in stage 5 chronic kidney disease, not on chronic dialysis (HCC) Normocytic anemia likely secondary to chronic kidney disease TSAT within goal of 25-30 SPEP: No M spike, slightly elevated free light chains but normal ratio -03/16/2024: Started on EPO shots every 2 weeks  -Labs reviewed today: Hemoglobin 10.1. - Reviewed labs from 08/17/2024 which showed hemoglobin 11.8 iron saturation 25%, ferritin 371 with normal TIBC. - Will administer EPO shot today.  Patient has required Depo shots about every month.  Will extend her out to every 3 weeks to see if we are able to maintain a hemoglobin between 10 and 11. -She received a dose of ferric - Will also administer 1 dose of Monoferric  on 06/16/2024 with good tolerance. -She does not need any additional iron infusions at this time. - Continue oral iron every other day.  Use MiraLAX  for constipation -Continue to follow with nephrology  Return to clinic in 3 months to see if there is an improvement, if not we will increase the dose of EPO  Vitamin B12 deficiency Patient has mild vitamin B12 deficiency with levels less than 400. B12 levels improved to 868.  -Reduce vitamin B12 1000 mcg from daily to every other day.  Elevated serum immunoglobulin free light chains Likely secondary to chronic kidney disease.  Normal ratio SPEP: No M spike  - No further workup necessary at this time   Orders Placed This Encounter  Procedures   CBC with Differential    Standing Status:   Future    Expected Date:   12/13/2024    Expiration Date:   03/13/2025   Comprehensive metabolic panel    Standing Status:   Future    Expected Date:   12/13/2024    Expiration Date:   03/13/2025    Iron and TIBC ([facility] ONLY)    Standing Status:   Future    Expected Date:   12/13/2024    Expiration Date:   03/13/2025   Ferritin    Standing Status:   Future    Expected Date:   12/13/2024    Expiration Date:   03/13/2025   Vitamin B12    Standing Status:   Future    Expected Date:   12/13/2024    Expiration Date:   03/13/2025   Folate    Standing Status:   Future    Expected Date:   12/13/2024    Expiration Date:   03/13/2025   CBC    Standing Status:   Standing    Number of Occurrences:   4    Expiration Date:   09/14/2025   I spent 20 minutes dedicated to the care of this patient (face-to-face and non-face-to-face) on the date of the encounter to include what is described in the assessment and plan.   All questions were answered. The patient knows to call the clinic with any problems, questions or concerns. No barriers to learning was detected.  Delon FORBES Hope, NP 12/4/202512:40 PM   SUMMARY OF HEMATOLOGIC HISTORY: Anemia of chronic kidney disease -SPEP: No M spike - Slightly elevated free light chains with normal ratio -03/16/2024: Started EPO 10000U every 2 weeks  INTERVAL HISTORY: [name] 78 y.o. female following for anemia  of chronic kidney disease.  Patient received Monoferric  on 06/16/2024 with great tolerance.  Reports her appetite is 75% energy levels are 25%.  She denies any pain.  Reports she does not sleep well secondary to nocturia.  Reports feeling slow.  Reports she did not feel significantly different following her iron infusion.  Denies any bright red blood per rectum, melena or hematochezia.  Reports she is taking her iron supplements every other day with vitamin C as prescribed.  Denies any interval hospitalizations, surgeries or this to her baseline health.   I have reviewed the past medical history, past surgical history, social history and family history with the patient   ALLERGIES:  has no known allergies.  MEDICATIONS:  Current  Outpatient Medications  Medication Sig Dispense Refill   ACCU-CHEK FASTCLIX LANCETS MISC U TO TEST QID  10   Accu-Chek FastClix Lancets MISC Use to check blood glucose 3 times daily as directed 102 each 11   aspirin  81 MG chewable tablet Chew 1 tablet (81 mg total) by mouth daily.     BOOSTRIX 5-2.5-18.5 LF-MCG/0.5 injection      donepezil  (ARICEPT ) 5 MG tablet Take 1 tablet (5 mg total) by mouth daily. 90 tablet 1   escitalopram  (LEXAPRO ) 10 MG tablet Take 10 mg by mouth daily.   1   fesoterodine  (TOVIAZ ) 8 MG TB24 tablet Take 1 tablet (8 mg total) by mouth daily. 30 tablet 11   glucose blood test strip Use to check blood sugar 4 times daily 100 each 3   JANUMET  50-1000 MG tablet Take 1 tablet by mouth 2 (two) times daily.     Lancets Misc. (ACCU-CHEK FASTCLIX LANCET) KIT See admin instructions.     levothyroxine  (SYNTHROID , LEVOTHROID) 100 MCG tablet Take 100 mcg by mouth daily.   3   Magnesium  400 MG CAPS Take by mouth.     olmesartan  (BENICAR ) 40 MG tablet Take 40 mg by mouth daily.     ondansetron  (ZOFRAN ) 4 MG tablet Take 4 mg by mouth every 8 (eight) hours as needed for nausea or vomiting.     pantoprazole  (PROTONIX ) 40 MG tablet Take 1 tablet (40 mg total) by mouth daily at 6 (six) AM. 30 tablet 1   PREVIDENT 5000 DRY MOUTH 1.1 % GEL dental gel Place 1 Application onto teeth at bedtime.     sitaGLIPtin -metformin  (JANUMET ) 50-1000 MG tablet Take 1 tablet by mouth 2 (two) times daily. 60 tablet 0   No current facility-administered medications for this visit.     REVIEW OF SYSTEMS:   Review of Systems  Constitutional:  Positive for malaise/fatigue.  Neurological:  Positive for dizziness.  Psychiatric/Behavioral:  The patient has insomnia.     PHYSICAL EXAMINATION:   Vitals:   09/14/24 1108  BP: (!) 123/58  Pulse: 61  Resp: 18  Temp: 97.9 F (36.6 C)  SpO2: 100%   Physical Activity: Not on file   Physical Exam Constitutional:      Appearance: Normal appearance.   HENT:     Head: Normocephalic and atraumatic.  Eyes:     Pupils: Pupils are equal, round, and reactive to light.  Cardiovascular:     Rate and Rhythm: Normal rate and regular rhythm.     Heart sounds: Normal heart sounds. No murmur heard. Pulmonary:     Effort: Pulmonary effort is normal.     Breath sounds: Normal breath sounds. No wheezing.  Abdominal:     General: Bowel sounds are normal. There is  no distension.     Palpations: Abdomen is soft.     Tenderness: There is no abdominal tenderness.  Musculoskeletal:        General: Normal range of motion.     Cervical back: Normal range of motion.  Skin:    General: Skin is warm and dry.     Findings: No rash.  Neurological:     Mental Status: She is alert and oriented to person, place, and time.  Psychiatric:        Judgment: Judgment normal.      LABORATORY DATA:  I have reviewed the data as listed  Lab Results  Component Value Date   WBC 7.2 09/14/2024   NEUTROABS 5.1 08/31/2024   HGB 10.1 (L) 09/14/2024   HCT 30.2 (L) 09/14/2024   MCV 98.4 09/14/2024   PLT 244 09/14/2024      Chemistry      Component Value Date/Time   NA 140 08/17/2024 1043   NA 138 11/16/2023 1536   K 3.8 08/17/2024 1043   CL 99 08/17/2024 1043   CO2 28 08/17/2024 1043   BUN 20 08/17/2024 1043   BUN 13 11/16/2023 1536   CREATININE 1.51 (H) 08/17/2024 1043      Component Value Date/Time   CALCIUM 9.4 08/17/2024 1043   ALKPHOS 61 08/17/2024 1043   AST 39 08/17/2024 1043   ALT 26 08/17/2024 1043   BILITOT 0.3 08/17/2024 1043   BILITOT 0.4 11/16/2023 1536       Latest Reference Range & Units 05/10/24 09:37  Iron 28 - 170 ug/dL 56  UIBC ug/dL 692  TIBC 749 - 549 ug/dL 636  Saturation Ratios 10.4 - 31.8 % 15  Ferritin 11 - 307 ng/mL 20  Folate >5.9 ng/mL 7.4  Vitamin B12 180 - 914 pg/mL 229     Latest Reference Range & Units 03/03/24 11:55  Total Protein ELP 6.0 - 8.5 g/dL 5.7 (L) (C)  Albumin SerPl Elph-Mcnc 2.9 - 4.4 g/dL 3.5  (C)  Albumin/Glob SerPl 0.7 - 1.7  1.6 (C)  Alpha2 Glob SerPl Elph-Mcnc 0.4 - 1.0 g/dL 0.7 (C)  Alpha 1 0.0 - 0.4 g/dL 0.2 (C)  Gamma Glob SerPl Elph-Mcnc 0.4 - 1.8 g/dL 0.5 (C)  M Protein SerPl Elph-Mcnc Not Observed g/dL Not Observed (C)  IFE 1  Comment (C)  Globulin, Total 2.2 - 3.9 g/dL 2.2 (C)  B-Globulin SerPl Elph-Mcnc 0.7 - 1.3 g/dL 0.8 (C)  IgG (Immunoglobin G), Serum 586 - 1,602 mg/dL 421 (L)  IgM (Immunoglobulin M), Srm 26 - 217 mg/dL 22 (L)  IgA 64 - 577 mg/dL 768  (L): Data is abnormally low (C): Corrected   Latest Reference Range & Units 03/03/24 11:55  Kappa free light chain 3.3 - 19.4 mg/L 33.1 (H)  Lambda free light chains 5.7 - 26.3 mg/L 32.4 (H)  Kappa, lambda light chain ratio 0.26 - 1.65  1.02  (H): Data is abnormally high   RADIOGRAPHIC STUDIES: I have personally reviewed the radiological images as listed and agreed with the findings in the report.  None to review

## 2024-09-14 NOTE — Assessment & Plan Note (Addendum)
 Patient has mild vitamin B12 deficiency with levels less than 400. B12 levels improved to 868.  -Reduce vitamin B12 1000 mcg from daily to every other day.

## 2024-09-25 ENCOUNTER — Other Ambulatory Visit: Payer: Self-pay

## 2024-09-25 ENCOUNTER — Other Ambulatory Visit (HOSPITAL_BASED_OUTPATIENT_CLINIC_OR_DEPARTMENT_OTHER): Payer: Self-pay

## 2024-09-28 ENCOUNTER — Inpatient Hospital Stay

## 2024-09-28 DIAGNOSIS — D631 Anemia in chronic kidney disease: Secondary | ICD-10-CM

## 2024-09-28 DIAGNOSIS — E1122 Type 2 diabetes mellitus with diabetic chronic kidney disease: Secondary | ICD-10-CM | POA: Diagnosis not present

## 2024-09-28 LAB — CBC
HCT: 34.8 % — ABNORMAL LOW (ref 36.0–46.0)
Hemoglobin: 11.5 g/dL — ABNORMAL LOW (ref 12.0–15.0)
MCH: 31.9 pg (ref 26.0–34.0)
MCHC: 33 g/dL (ref 30.0–36.0)
MCV: 96.7 fL (ref 80.0–100.0)
Platelets: 347 K/uL (ref 150–400)
RBC: 3.6 MIL/uL — ABNORMAL LOW (ref 3.87–5.11)
RDW: 13.5 % (ref 11.5–15.5)
WBC: 9.3 K/uL (ref 4.0–10.5)
nRBC: 0 % (ref 0.0–0.2)

## 2024-09-28 NOTE — Progress Notes (Signed)
 Patient's Hgb 11.5. Per parameters pt does not need retacrit  injection today. Pt updated and all questions answered. All follow ups as scheduled.   [name]

## 2024-10-06 ENCOUNTER — Other Ambulatory Visit (HOSPITAL_BASED_OUTPATIENT_CLINIC_OR_DEPARTMENT_OTHER): Payer: Self-pay

## 2024-10-06 ENCOUNTER — Other Ambulatory Visit (HOSPITAL_COMMUNITY): Payer: Self-pay

## 2024-10-09 ENCOUNTER — Other Ambulatory Visit: Payer: Self-pay | Admitting: Oncology

## 2024-10-09 ENCOUNTER — Other Ambulatory Visit (HOSPITAL_BASED_OUTPATIENT_CLINIC_OR_DEPARTMENT_OTHER): Payer: Self-pay

## 2024-10-09 ENCOUNTER — Encounter: Payer: Self-pay | Admitting: *Deleted

## 2024-10-09 ENCOUNTER — Other Ambulatory Visit (HOSPITAL_BASED_OUTPATIENT_CLINIC_OR_DEPARTMENT_OTHER): Payer: Self-pay | Admitting: Cardiology

## 2024-10-10 ENCOUNTER — Other Ambulatory Visit (HOSPITAL_BASED_OUTPATIENT_CLINIC_OR_DEPARTMENT_OTHER): Payer: Self-pay | Admitting: Cardiology

## 2024-10-10 ENCOUNTER — Other Ambulatory Visit: Payer: Self-pay | Admitting: Pharmacist Clinician (PhC)/ Clinical Pharmacy Specialist

## 2024-10-10 ENCOUNTER — Other Ambulatory Visit (HOSPITAL_BASED_OUTPATIENT_CLINIC_OR_DEPARTMENT_OTHER): Payer: Self-pay

## 2024-10-10 ENCOUNTER — Other Ambulatory Visit: Payer: Self-pay

## 2024-10-10 ENCOUNTER — Encounter: Payer: Self-pay | Admitting: Oncology

## 2024-10-10 MED ORDER — CLONIDINE 0.2 MG/24HR TD PTWK
0.2000 mg | MEDICATED_PATCH | TRANSDERMAL | 12 refills | Status: AC
  Filled 2024-10-10: qty 4, 28d supply, fill #0

## 2024-10-10 MED ORDER — SIMVASTATIN 40 MG PO TABS
40.0000 mg | ORAL_TABLET | Freq: Every evening | ORAL | 3 refills | Status: AC
  Filled 2024-10-10: qty 90, 90d supply, fill #0

## 2024-10-10 MED ORDER — LEVOTHYROXINE SODIUM 100 MCG PO TABS
100.0000 ug | ORAL_TABLET | Freq: Every morning | ORAL | 3 refills | Status: AC
  Filled 2024-10-10: qty 90, 90d supply, fill #0

## 2024-10-10 MED ORDER — VITAMIN B-12 1000 MCG PO TABS
1000.0000 ug | ORAL_TABLET | Freq: Every day | ORAL | 2 refills | Status: AC
  Filled 2024-10-10: qty 90, 90d supply, fill #0

## 2024-10-10 MED ORDER — AMLODIPINE BESYLATE 10 MG PO TABS
10.0000 mg | ORAL_TABLET | Freq: Every day | ORAL | 3 refills | Status: AC
  Filled 2024-10-10: qty 90, 90d supply, fill #0

## 2024-10-10 MED ORDER — OLMESARTAN MEDOXOMIL 40 MG PO TABS
40.0000 mg | ORAL_TABLET | Freq: Every day | ORAL | 3 refills | Status: AC
  Filled 2024-10-10: qty 90, 90d supply, fill #0

## 2024-10-11 ENCOUNTER — Other Ambulatory Visit: Payer: Self-pay

## 2024-10-11 ENCOUNTER — Ambulatory Visit (HOSPITAL_COMMUNITY)

## 2024-10-11 ENCOUNTER — Other Ambulatory Visit (HOSPITAL_BASED_OUTPATIENT_CLINIC_OR_DEPARTMENT_OTHER): Payer: Self-pay

## 2024-10-11 ENCOUNTER — Encounter (HOSPITAL_COMMUNITY)
Admission: RE | Disposition: A | Discharge status: Home / Self Care | Payer: Self-pay | Source: Home / Self Care | Attending: Gastroenterology

## 2024-10-11 ENCOUNTER — Encounter (HOSPITAL_COMMUNITY): Payer: Self-pay | Admitting: Gastroenterology

## 2024-10-11 ENCOUNTER — Ambulatory Visit (HOSPITAL_COMMUNITY)
Admission: RE | Admit: 2024-10-11 | Discharge: 2024-10-11 | Disposition: A | Discharge status: Home / Self Care | Source: Home / Self Care | Attending: Gastroenterology | Admitting: Gastroenterology

## 2024-10-11 DIAGNOSIS — Z860101 Personal history of adenomatous and serrated colon polyps: Secondary | ICD-10-CM | POA: Diagnosis not present

## 2024-10-11 DIAGNOSIS — K5730 Diverticulosis of large intestine without perforation or abscess without bleeding: Secondary | ICD-10-CM | POA: Insufficient documentation

## 2024-10-11 DIAGNOSIS — D122 Benign neoplasm of ascending colon: Secondary | ICD-10-CM

## 2024-10-11 DIAGNOSIS — E039 Hypothyroidism, unspecified: Secondary | ICD-10-CM | POA: Insufficient documentation

## 2024-10-11 DIAGNOSIS — D509 Iron deficiency anemia, unspecified: Secondary | ICD-10-CM | POA: Diagnosis not present

## 2024-10-11 DIAGNOSIS — E119 Type 2 diabetes mellitus without complications: Secondary | ICD-10-CM | POA: Insufficient documentation

## 2024-10-11 DIAGNOSIS — I10 Essential (primary) hypertension: Secondary | ICD-10-CM

## 2024-10-11 DIAGNOSIS — Z09 Encounter for follow-up examination after completed treatment for conditions other than malignant neoplasm: Secondary | ICD-10-CM | POA: Diagnosis present

## 2024-10-11 DIAGNOSIS — D125 Benign neoplasm of sigmoid colon: Secondary | ICD-10-CM | POA: Insufficient documentation

## 2024-10-11 DIAGNOSIS — K648 Other hemorrhoids: Secondary | ICD-10-CM | POA: Insufficient documentation

## 2024-10-11 DIAGNOSIS — Z1211 Encounter for screening for malignant neoplasm of colon: Secondary | ICD-10-CM | POA: Diagnosis not present

## 2024-10-11 HISTORY — PX: COLONOSCOPY: SHX5424

## 2024-10-11 LAB — HM COLONOSCOPY

## 2024-10-11 LAB — GLUCOSE, CAPILLARY: Glucose-Capillary: 129 mg/dL — ABNORMAL HIGH (ref 70–99)

## 2024-10-11 SURGERY — COLONOSCOPY
Anesthesia: General

## 2024-10-11 MED ORDER — LIDOCAINE 2% (20 MG/ML) 5 ML SYRINGE
INTRAMUSCULAR | Status: DC | PRN
  Administered 2024-10-11: 60 mg via INTRAVENOUS

## 2024-10-11 MED ORDER — LACTATED RINGERS IV SOLN: INTRAVENOUS | Status: DC | PRN

## 2024-10-11 MED ORDER — PROPOFOL 10 MG/ML IV BOLUS
INTRAVENOUS | Status: DC | PRN
  Administered 2024-10-11: 40 mg via INTRAVENOUS

## 2024-10-11 MED ORDER — PROPOFOL 500 MG/50ML IV EMUL
INTRAVENOUS | Status: DC | PRN
  Administered 2024-10-11: 120 ug/kg/min via INTRAVENOUS

## 2024-10-11 NOTE — Anesthesia Postprocedure Evaluation (Signed)
"   Anesthesia Post Note  Patient: [name]  Procedure(s) Performed: COLONOSCOPY  Patient location during evaluation: PACU Anesthesia Type: General Level of consciousness: awake and alert Pain management: pain level controlled Vital Signs Assessment: post-procedure vital signs reviewed and stable Respiratory status: spontaneous breathing, nonlabored ventilation, respiratory function stable and patient connected to nasal cannula oxygen  Cardiovascular status: blood pressure returned to baseline and stable Postop Assessment: no apparent nausea or vomiting Anesthetic complications: no   No notable events documented.   Last Vitals:  Vitals:   10/11/24 1553 10/11/24 1559  BP: (!) 165/84 (!) 152/89  Pulse: 73 78  Resp: 16 17  Temp: 36.7 C   SpO2: 99% 100%    Last Pain:  Vitals:   10/11/24 1553  TempSrc: Oral  PainSc: 0-No pain                 Andrea Limes      "

## 2024-10-11 NOTE — Transfer of Care (Signed)
 Immediate Anesthesia Transfer of Care Note  Patient: [name]  Procedure(s) Performed: COLONOSCOPY  Patient Location: Endoscopy Unit  Anesthesia Type:General  Level of Consciousness: awake, alert , oriented, and patient cooperative  Airway & Oxygen  Therapy: Patient Spontanous Breathing  Post-op Assessment: Report given to RN, Post -op Vital signs reviewed and stable, and Patient moving all extremities X 4  Post vital signs: Reviewed and stable  Last Vitals:  Vitals Value Taken Time  BP 165/84 10/11/24 15:53  Temp 36.7 C 10/11/24 15:53  Pulse 73 10/11/24 15:53  Resp 16 10/11/24 15:53  SpO2 99 % 10/11/24 15:53    Last Pain:  Vitals:   10/11/24 1553  TempSrc: Oral  PainSc: 0-No pain      Patients Stated Pain Goal: 5 (10/11/24 1324)  Complications: No notable events documented.

## 2024-10-11 NOTE — Anesthesia Preprocedure Evaluation (Signed)
"                                    Anesthesia Evaluation  Patient identified by MRN, date of birth, ID band Patient awake    Reviewed: Allergy & Precautions, H&P , NPO status , Patient's Chart, lab work & pertinent test results  History of Anesthesia Complications (+) PONV and history of anesthetic complications  Airway Mallampati: II  TM Distance: >3 FB Neck ROM: Full    Dental no notable dental hx.    Pulmonary neg pulmonary ROS   Pulmonary exam normal breath sounds clear to auscultation       Cardiovascular hypertension, Normal cardiovascular exam Rhythm:Regular Rate:Normal     Neuro/Psych negative neurological ROS  negative psych ROS   GI/Hepatic negative GI ROS, Neg liver ROS,,,  Endo/Other  diabetesHypothyroidism    Renal/GU Renal InsufficiencyRenal disease  negative genitourinary   Musculoskeletal negative musculoskeletal ROS (+)    Abdominal   Peds negative pediatric ROS (+)  Hematology  (+) Blood dyscrasia, anemia   Anesthesia Other Findings   Reproductive/Obstetrics negative OB ROS                              Anesthesia Physical Anesthesia Plan  ASA: 3  Anesthesia Plan: General   Post-op Pain Management:    Induction: Intravenous  PONV Risk Score and Plan:   Airway Management Planned: Nasal Cannula and Natural Airway  Additional Equipment:   Intra-op Plan:   Post-operative Plan:   Informed Consent: I have reviewed the patients History and Physical, chart, labs and discussed the procedure including the risks, benefits and alternatives for the proposed anesthesia with the patient or authorized representative who has indicated his/her understanding and acceptance.     Dental advisory given  Plan Discussed with: CRNA  Anesthesia Plan Comments:         Anesthesia Quick Evaluation  "

## 2024-10-11 NOTE — H&P (Signed)
 " Primary Care Physician:  Nichole Senior, MD Primary Gastroenterologist:  Dr. Cinderella  Pre-Procedure History & Physical: HPI:  [name] is a 78 y.o. female is here for a colonoscopy for colon polyp surveillance   Patient denies any family history of colorectal cancer.  No melena or hematochezia.  No abdominal pain or unintentional weight loss.  No change in bowel habits.  Overall feels well from a GI standpoint.  11/2023 - One 12 mm polyp in the cecum, removed with mucosal resection. Resected and retrieved. This polyp was sponatneously oozing and could explain iron deficiency anemia - One 30 mm polyp in the ascending colon, removed with mucosal resection. Resected and retrieved. Treated with a hot snare. Clip ( MR conditional) was placed. Clip manufacturer: [employer]. - Six 4 to 9 mm polyps in the sigmoid colon, in the transverse colon and in the ascending colon, removed with a cold snare. Resected and retrieved. - Diverticulosis in the left colon. - Non- bleeding external and internal hemorrhoids. - Mucosal resection was performed. Resection and retrieval were complete. - Mucosal resection was performed. Resection and retrieval were complete.  Past Medical History:  Diagnosis Date   Cancer [facility]) 1983   Breast Cancer   Diabetes mellitus without complication (HCC)    High cholesterol    History of malignant neoplasm of breast 08/28/2024   Hypertension    Hypothyroidism    Mild cognitive impairment 08/28/2024   Overactive bladder    PONV (postoperative nausea and vomiting)     Past Surgical History:  Procedure Laterality Date   ABDOMINAL HYSTERECTOMY     BIOPSY  10/14/2023   Procedure: BIOPSY;  Surgeon: Cinderella [name], MD;  [facility]: [facility];  Service: Endoscopy;;   COLONOSCOPY WITH PROPOFOL  N/A 11/25/2023   Procedure: COLONOSCOPY WITH PROPOFOL ;  Surgeon: Cinderella [name], MD;  [facility];  Service: Endoscopy;  Laterality: N/A;  10:00AM;ASA 3    ESOPHAGOGASTRODUODENOSCOPY N/A 05/11/2024   Procedure: EGD (ESOPHAGOGASTRODUODENOSCOPY);  Surgeon: Wilhelmenia Aloha Raddle., MD;  Location: [facility];  Service: Gastroenterology;  Laterality: N/A;   ESOPHAGOGASTRODUODENOSCOPY (EGD) WITH PROPOFOL  N/A 10/14/2023   Procedure: ESOPHAGOGASTRODUODENOSCOPY (EGD) WITH PROPOFOL ;  Surgeon: Cinderella [name], MD;  [facility];  Service: Endoscopy;  Laterality: N/A;   EUS N/A 05/11/2024   Procedure: ULTRASOUND, UPPER GI TRACT, ENDOSCOPIC;  Surgeon: Wilhelmenia Aloha Raddle., MD;  Location: [facility];  Service: Gastroenterology;  Laterality: N/A;   FOOT SURGERY     HEMOSTASIS CLIP PLACEMENT  11/25/2023   Procedure: HEMOSTASIS CLIP PLACEMENT;  Surgeon: Cinderella [name], MD;  [facility];  Service: Endoscopy;;   MASTECTOMY Right    POLYPECTOMY  11/25/2023   Procedure: POLYPECTOMY;  Surgeon: Cinderella [name], MD;  [facility];  Service: Endoscopy;;  hot and cold   SCLEROTHERAPY  11/25/2023   Procedure: SCLEROTHERAPY;  Surgeon: Cinderella [name], MD;  [facility];  Service: Endoscopy;;   SUBMUCOSAL LIFTING INJECTION  11/25/2023   Procedure: SUBMUCOSAL LIFTING INJECTION;  Surgeon: Cinderella [name], MD;  [facility];  Service: Endoscopy;;    Prior to Admission medications  Medication Sig Start Date End Date Taking? Authorizing Provider  amLODipine  (NORVASC ) 10 MG tablet Take 1 tablet (10 mg total) by mouth daily. 10/10/24  Yes   aspirin  81 MG chewable tablet Chew 1 tablet (81 mg total) by mouth daily. 10/16/23  Yes Vicci Afton CROME, MD  BOOSTRIX 5-2.5-18.5 LF-MCG/0.5 injection  06/21/24  Yes [provider]  cloNIDine  (CATAPRES  - DOSED IN MG/24 HR) 0.2 mg/24hr patch Place 1 patch (0.2 mg total) onto the skin once a week. 10/10/24  Yes Lavona Agent, MD  cyanocobalamin  (VITAMIN B12) 1000 MCG tablet Take 1 tablet (1,000 mcg total) by mouth daily. 10/10/24  Yes Davonna Siad, MD   donepezil  (ARICEPT ) 5 MG tablet Take 1 tablet (5 mg total) by mouth daily. 07/29/23  Yes   escitalopram  (LEXAPRO ) 10 MG tablet Take 10 mg by mouth daily.  01/20/18  Yes [provider]  fesoterodine  (TOVIAZ ) 8 MG TB24 tablet Take 1 tablet (8 mg total) by mouth daily. 11/22/23  Yes McKenzie, Belvie CROME, MD  levothyroxine  (SYNTHROID ) 100 MCG tablet Take 1 tablet (100 mcg total) by mouth in the morning on an empty stomach. 10/10/24  Yes   levothyroxine  (SYNTHROID , LEVOTHROID) 100 MCG tablet Take 100 mcg by mouth daily.  01/21/18  Yes [provider]  Magnesium  400 MG CAPS Take by mouth.   Yes [provider]  olmesartan  (BENICAR ) 40 MG tablet Take 40 mg by mouth daily.   Yes [provider]  pantoprazole  (PROTONIX ) 40 MG tablet Take 1 tablet (40 mg total) by mouth daily at 6 (six) AM. 06/19/24  Yes Juda Lajeunesse, [name], MD  simvastatin  (ZOCOR ) 40 MG tablet Take 1 tablet (40 mg total) by mouth every evening. 10/10/24  Yes   ACCU-CHEK FASTCLIX LANCETS MISC U TO TEST QID 01/21/18   [provider]  Accu-Chek FastClix Lancets MISC Use to check blood glucose 3 times daily as directed 09/06/23     glucose blood test strip Use to check blood sugar 4 times daily 08/25/23     JANUMET  50-1000 MG tablet Take 1 tablet by mouth 2 (two) times daily. 10/05/23   [provider]  Lancets Misc. (ACCU-CHEK FASTCLIX LANCET) KIT See admin instructions.    [provider]  olmesartan  (BENICAR ) 40 MG tablet Take 1 tablet (40 mg total) by mouth daily. 10/10/24     ondansetron  (ZOFRAN ) 4 MG tablet Take 4 mg by mouth every 8 (eight) hours as needed for nausea or vomiting.    [provider]  PREVIDENT 5000 DRY MOUTH 1.1 % GEL dental gel Place 1 Application onto teeth at bedtime. 09/30/23   [provider]  sitaGLIPtin -metformin  (JANUMET ) 50-1000 MG tablet Take 1 tablet by mouth 2 (two) times daily. 08/24/24       Allergies as of 09/05/2024   (No Known  Allergies)    Family History  Problem Relation Age of Onset   Heart failure Mother        3   Brain cancer Father     Social History   Socioeconomic History   Marital status: Single    Spouse name: Not on file   Number of children: Not on file   Years of education: Not on file   Highest education level: High school graduate  Occupational History   Occupation: retired   Tobacco Use   Smoking status: Never   Smokeless tobacco: Never  Vaping Use   Vaping status: Never Used  Substance and Sexual Activity   Alcohol use: Never   Drug use: Never   Sexual activity: Not Currently  Other Topics Concern   Not on file  Social History Narrative   Lives with daughter.  Two children.    Social Drivers of Health   Tobacco Use: Low Risk (10/11/2024)   Patient History    Smoking Tobacco Use:  Never    Smokeless Tobacco Use: Never    Passive Exposure: Not on file  Financial Resource Strain: Not on file  Food Insecurity: Food Insecurity Present (03/03/2024)   Hunger Vital Sign    Worried About Running Out of Food in the Last Year: Sometimes true    Ran Out of Food in the Last Year: Sometimes true  Transportation Needs: No Transportation Needs (03/03/2024)   PRAPARE - Administrator, Civil Service (Medical): No    Lack of Transportation (Non-Medical): No  Physical Activity: Not on file  Stress: Not on file  Social Connections: Moderately Isolated (10/13/2023)   Social Connection and Isolation Panel    Frequency of Communication with Friends and Family: More than three times a week    Frequency of Social Gatherings with Friends and Family: Once a week    Attends Religious Services: More than 4 times per year    Active Member of [employer] or Organizations: No    Attends Banker Meetings: Never    Marital Status: Widowed  Intimate Partner Violence: Not At Risk (03/03/2024)   Humiliation, Afraid, Rape, and Kick questionnaire    Fear of Current or Ex-Partner: No     Emotionally Abused: No    Physically Abused: No    Sexually Abused: No  Depression (PHQ2-9): Low Risk (09/14/2024)   Depression (PHQ2-9)    PHQ-2 Score: 0  Alcohol Screen: Not on file  Housing: Low Risk (03/03/2024)   Housing Stability Vital Sign    Unable to Pay for Housing in the Last Year: No    Number of Times Moved in the Last Year: 0    Homeless in the Last Year: No  Utilities: Not At Risk (10/13/2023)   [employer] Utilities    Threatened with loss of utilities: No  Health Literacy: Not on file    Review of Systems: See HPI, otherwise negative ROS  Physical Exam: Vital signs in last 24 hours: Weight:  [53.5 kg] 53.5 kg (12/31 1324)   General:   Alert,  Well-developed, well-nourished, pleasant and cooperative in NAD Head:  Normocephalic and atraumatic. Eyes:  Sclera clear, no icterus.   Conjunctiva pink. Ears:  Normal auditory acuity. Nose:  No deformity, discharge,  or lesions. Msk:  Symmetrical without gross deformities. Normal posture. Extremities:  Without clubbing or edema. Neurologic:  Alert and  oriented x4;  grossly normal neurologically. Skin:  Intact without significant lesions or rashes. Psych:  Alert and cooperative. Normal mood and affect.  Impression/Plan: [name] is a 78 y.o. female is here for a colonoscopy for colon polyp surveillance   The risks of the procedure including infection, bleed, or perforation as well as benefits, limitations, alternatives and imponderables have been reviewed with the patient. Questions have been answered. All parties agreeable.  "

## 2024-10-11 NOTE — Op Note (Signed)
 [facility] Patient Name: [name] Procedure Date: 10/11/2024 3:07 PM MRN: [id number] Date of Birth: 1945/11/28 Attending MD: Deatrice Dine , MD, [id number] CSN: [id number] Age: 78 Admit Type: Outpatient Procedure:                Colonoscopy Indications:              High risk colon cancer surveillance: Personal                            history of colonic polyps, High risk colon cancer                            surveillance: Personal history of adenoma (10 mm or                            greater in size) Providers:                Deatrice Dine, MD, Rosina Sprague, Chad Wilson,                            Technician Referring MD:              Medicines:                Monitored Anesthesia Care Complications:            No immediate complications. Estimated Blood Loss:     Estimated blood loss was minimal. Procedure:                Pre-Anesthesia Assessment:                           - Prior to the procedure, a History and Physical                            was performed, and patient medications and                            allergies were reviewed. The patient's tolerance of                            previous anesthesia was also reviewed. The risks                            and benefits of the procedure and the sedation                            options and risks were discussed with the patient.                            All questions were answered, and informed consent                            was obtained. Prior Anticoagulants: The patient has  taken no anticoagulant or antiplatelet agents. ASA                            Grade Assessment: II - A patient with mild systemic                            disease. After reviewing the risks and benefits,                            the patient was deemed in satisfactory condition to                            undergo the procedure.                           After obtaining informed consent, the  colonoscope                            was passed under direct vision. Throughout the                            procedure, the patient's blood pressure, pulse, and                            oxygen  saturations were monitored continuously. The                            PCF-[id number] ([id number]) was introduced through the                            anus and advanced to the the cecum, identified by                            appendiceal orifice and ileocecal valve. The                            ileocecal valve, appendiceal orifice, and rectum                            were photographed. Scope In: 3:22:49 PM Scope Out: 3:47:17 PM Scope Withdrawal Time: 0 hours 17 minutes 23 seconds  Total Procedure Duration: 0 hours 24 minutes 28 seconds  Findings:      The perianal and digital rectal examinations were normal.      A 4 mm polyp was found in the ascending colon. The polyp was sessile.       The polyp was removed with a cold snare. Resection and retrieval were       complete.      A 4 mm polyp was found in the sigmoid colon. The polyp was sessile. The       polyp was removed with a cold snare. Resection and retrieval were       complete.      Scattered medium-mouthed diverticula were found in the left colon.      Non-bleeding internal hemorrhoids were found during retroflexion. The  hemorrhoids were small. Impression:               - One 4 mm polyp in the ascending colon, removed                            with a cold snare. Resected and retrieved.                           - One 4 mm polyp in the sigmoid colon, removed with                            a cold snare. Resected and retrieved.                           - Diverticulosis in the left colon.                           - Non-bleeding internal hemorrhoids. Moderate Sedation:      Per Anesthesia Care Recommendation:           - Patient has a contact number available for                            emergencies. The signs and symptoms  of potential                            delayed complications were discussed with the                            patient. Return to normal activities tomorrow.                            Written discharge instructions were provided to the                            patient.                           - Resume previous diet.                           - Continue present medications.                           - Await pathology results.                           - Repeat colonoscopy in 3 years for surveillance                            after piecemeal polypectomy ( 1year ago ) and for                            surveillance based on pathology results.                           -  Return to primary care physician as previously                            scheduled. Procedure Code(s):        --- Professional ---                           [phone], Colonoscopy, flexible; with removal of                            tumor(s), polyp(s), or other lesion(s) by snare                            technique Diagnosis Code(s):        --- Professional ---                           D12.2, Benign neoplasm of ascending colon                           D12.5, Benign neoplasm of sigmoid colon                           K64.8, Other hemorrhoids                           Z86.010, Personal history of colonic polyps                           K57.30, Diverticulosis of large intestine without                            perforation or abscess without bleeding CPT copyright 2022 American Medical Association. All rights reserved. The codes documented in this report are preliminary and upon coder review may  be revised to meet current compliance requirements. Deatrice Dine, MD Deatrice Dine, MD 10/11/2024 3:51:55 PM This report has been signed electronically. Number of Addenda: 0

## 2024-10-11 NOTE — Anesthesia Postprocedure Evaluation (Signed)
"   Anesthesia Post Note  Patient: [name]  Procedure(s) Performed: COLONOSCOPY  Anesthesia Type: General Anesthetic complications: no   No notable events documented.   Last Vitals:  Vitals:   10/11/24 1553 10/11/24 1559  BP: (!) 165/84 (!) 152/89  Pulse: 73 78  Resp: 16 17  Temp: 36.7 C   SpO2: 99% 100%    Last Pain:  Vitals:   10/11/24 1553  TempSrc: Oral  PainSc: 0-No pain                 Andrea Limes      "

## 2024-10-13 ENCOUNTER — Encounter (INDEPENDENT_AMBULATORY_CARE_PROVIDER_SITE_OTHER): Payer: Self-pay | Admitting: *Deleted

## 2024-10-13 ENCOUNTER — Other Ambulatory Visit (HOSPITAL_BASED_OUTPATIENT_CLINIC_OR_DEPARTMENT_OTHER): Payer: Self-pay

## 2024-10-15 ENCOUNTER — Encounter (HOSPITAL_COMMUNITY): Payer: Self-pay | Admitting: Emergency Medicine

## 2024-10-15 ENCOUNTER — Emergency Department (HOSPITAL_COMMUNITY)

## 2024-10-15 ENCOUNTER — Emergency Department (HOSPITAL_COMMUNITY)
Admission: EM | Admit: 2024-10-15 | Discharge: 2024-10-15 | Disposition: A | Discharge status: Home / Self Care | Attending: Emergency Medicine | Admitting: Emergency Medicine

## 2024-10-15 ENCOUNTER — Other Ambulatory Visit: Payer: Self-pay

## 2024-10-15 DIAGNOSIS — R519 Headache, unspecified: Secondary | ICD-10-CM | POA: Diagnosis not present

## 2024-10-15 DIAGNOSIS — N189 Chronic kidney disease, unspecified: Secondary | ICD-10-CM | POA: Insufficient documentation

## 2024-10-15 DIAGNOSIS — Z7982 Long term (current) use of aspirin: Secondary | ICD-10-CM | POA: Insufficient documentation

## 2024-10-15 DIAGNOSIS — S00531A Contusion of lip, initial encounter: Secondary | ICD-10-CM | POA: Insufficient documentation

## 2024-10-15 DIAGNOSIS — Z79899 Other long term (current) drug therapy: Secondary | ICD-10-CM | POA: Diagnosis not present

## 2024-10-15 DIAGNOSIS — S0012XA Contusion of left eyelid and periocular area, initial encounter: Secondary | ICD-10-CM | POA: Insufficient documentation

## 2024-10-15 DIAGNOSIS — M542 Cervicalgia: Secondary | ICD-10-CM | POA: Diagnosis not present

## 2024-10-15 DIAGNOSIS — Z853 Personal history of malignant neoplasm of breast: Secondary | ICD-10-CM | POA: Diagnosis not present

## 2024-10-15 DIAGNOSIS — W19XXXA Unspecified fall, initial encounter: Secondary | ICD-10-CM

## 2024-10-15 DIAGNOSIS — E1122 Type 2 diabetes mellitus with diabetic chronic kidney disease: Secondary | ICD-10-CM | POA: Diagnosis not present

## 2024-10-15 DIAGNOSIS — R051 Acute cough: Secondary | ICD-10-CM | POA: Diagnosis not present

## 2024-10-15 DIAGNOSIS — S0083XA Contusion of other part of head, initial encounter: Secondary | ICD-10-CM

## 2024-10-15 DIAGNOSIS — I129 Hypertensive chronic kidney disease with stage 1 through stage 4 chronic kidney disease, or unspecified chronic kidney disease: Secondary | ICD-10-CM | POA: Insufficient documentation

## 2024-10-15 DIAGNOSIS — W1809XA Striking against other object with subsequent fall, initial encounter: Secondary | ICD-10-CM | POA: Insufficient documentation

## 2024-10-15 DIAGNOSIS — Z7984 Long term (current) use of oral hypoglycemic drugs: Secondary | ICD-10-CM | POA: Insufficient documentation

## 2024-10-15 DIAGNOSIS — T148XXA Other injury of unspecified body region, initial encounter: Secondary | ICD-10-CM

## 2024-10-15 DIAGNOSIS — S0033XA Contusion of nose, initial encounter: Secondary | ICD-10-CM | POA: Insufficient documentation

## 2024-10-15 DIAGNOSIS — S0993XA Unspecified injury of face, initial encounter: Secondary | ICD-10-CM | POA: Diagnosis present

## 2024-10-15 LAB — CBC WITH DIFFERENTIAL/PLATELET
Abs Immature Granulocytes: 0.05 K/uL (ref 0.00–0.07)
Basophils Absolute: 0.1 K/uL (ref 0.0–0.1)
Basophils Relative: 1 %
Eosinophils Absolute: 0.1 K/uL (ref 0.0–0.5)
Eosinophils Relative: 1 %
HCT: 33 % — ABNORMAL LOW (ref 36.0–46.0)
Hemoglobin: 11 g/dL — ABNORMAL LOW (ref 12.0–15.0)
Immature Granulocytes: 1 %
Lymphocytes Relative: 18 %
Lymphs Abs: 1.9 K/uL (ref 0.7–4.0)
MCH: 31.8 pg (ref 26.0–34.0)
MCHC: 33.3 g/dL (ref 30.0–36.0)
MCV: 95.4 fL (ref 80.0–100.0)
Monocytes Absolute: 0.7 K/uL (ref 0.1–1.0)
Monocytes Relative: 6 %
Neutro Abs: 7.7 K/uL (ref 1.7–7.7)
Neutrophils Relative %: 73 %
Platelets: 293 K/uL (ref 150–400)
RBC: 3.46 MIL/uL — ABNORMAL LOW (ref 3.87–5.11)
RDW: 13 % (ref 11.5–15.5)
WBC: 10.5 K/uL (ref 4.0–10.5)
nRBC: 0 % (ref 0.0–0.2)

## 2024-10-15 LAB — URINALYSIS, ROUTINE W REFLEX MICROSCOPIC
Bilirubin Urine: NEGATIVE
Glucose, UA: NEGATIVE mg/dL
Hgb urine dipstick: NEGATIVE
Ketones, ur: NEGATIVE mg/dL
Leukocytes,Ua: NEGATIVE
Nitrite: NEGATIVE
Protein, ur: NEGATIVE mg/dL
Specific Gravity, Urine: 1.01 (ref 1.005–1.030)
pH: 5 (ref 5.0–8.0)

## 2024-10-15 LAB — COMPREHENSIVE METABOLIC PANEL WITH GFR
ALT: 10 U/L (ref 0–44)
AST: 15 U/L (ref 15–41)
Albumin: 4.1 g/dL (ref 3.5–5.0)
Alkaline Phosphatase: 49 U/L (ref 38–126)
Anion gap: 14 (ref 5–15)
BUN: 24 mg/dL — ABNORMAL HIGH (ref 8–23)
CO2: 24 mmol/L (ref 22–32)
Calcium: 9.5 mg/dL (ref 8.9–10.3)
Chloride: 100 mmol/L (ref 98–111)
Creatinine, Ser: 1.65 mg/dL — ABNORMAL HIGH (ref 0.44–1.00)
GFR, Estimated: 31 mL/min — ABNORMAL LOW
Glucose, Bld: 178 mg/dL — ABNORMAL HIGH (ref 70–99)
Potassium: 4.5 mmol/L (ref 3.5–5.1)
Sodium: 139 mmol/L (ref 135–145)
Total Bilirubin: 0.4 mg/dL (ref 0.0–1.2)
Total Protein: 6.3 g/dL — ABNORMAL LOW (ref 6.5–8.1)

## 2024-10-15 LAB — CBG MONITORING, ED: Glucose-Capillary: 180 mg/dL — ABNORMAL HIGH (ref 70–99)

## 2024-10-15 MED ORDER — ACETAMINOPHEN 325 MG PO TABS
650.0000 mg | ORAL_TABLET | Freq: Once | ORAL | Status: AC
  Administered 2024-10-15: 650 mg via ORAL
  Filled 2024-10-15: qty 2

## 2024-10-15 MED ORDER — AZITHROMYCIN 250 MG PO TABS: 250.0000 mg | ORAL_TABLET | Freq: Every day | ORAL | 0 refills | Status: DC

## 2024-10-15 MED ORDER — AZITHROMYCIN 250 MG PO TABS
500.0000 mg | ORAL_TABLET | Freq: Once | ORAL | Status: AC
  Administered 2024-10-15: 500 mg via ORAL
  Filled 2024-10-15: qty 2

## 2024-10-15 MED ORDER — ACETAMINOPHEN 325 MG PO TABS
ORAL_TABLET | ORAL | Status: AC
  Filled 2024-10-15: qty 1

## 2024-10-15 MED ORDER — BACITRACIN ZINC 500 UNIT/GM EX OINT: 1.0000 | TOPICAL_OINTMENT | Freq: Two times a day (BID) | CUTANEOUS | 0 refills | Status: DC

## 2024-10-15 MED ORDER — BACITRACIN ZINC 500 UNIT/GM EX OINT: 1.0000 | TOPICAL_OINTMENT | Freq: Two times a day (BID) | CUTANEOUS | 0 refills | Status: AC

## 2024-10-15 MED ORDER — AZITHROMYCIN 250 MG PO TABS: 250.0000 mg | ORAL_TABLET | Freq: Every day | ORAL | 0 refills | Status: AC

## 2024-10-15 MED ORDER — BACITRACIN ZINC 500 UNIT/GM EX OINT
TOPICAL_OINTMENT | Freq: Two times a day (BID) | CUTANEOUS | Status: DC
  Administered 2024-10-15: 2 via TOPICAL
  Filled 2024-10-15: qty 1.8

## 2024-10-15 NOTE — ED Notes (Signed)
 Pt/family received d/c paperwork at this time. After going over the paperwork any questions, comments, or concerns were answered to the best of this nurse's knowledge. The pt/family verbally acknowledged the teachings/instructions.

## 2024-10-15 NOTE — ED Triage Notes (Signed)
 Per ems pt coming from church- patient was found sitting on the sidewalk. Unsure of any LOC. Patient states she arrived at church 10:50 and patient was found about 30-40 minutes later. Patient has blood covering entire face, abrasion to left side and swelling to left side of face. Equal grip strength and sensation to all extremities. No blood thinners. Pt alert to self, location and time.

## 2024-10-15 NOTE — ED Notes (Signed)
 Wound/face cleaned.

## 2024-10-15 NOTE — Discharge Instructions (Signed)
 Keep the wound clean and bandaged.  Gently with mild soap and water.  You may apply cool compresses on and off to help with bruising and swelling to your face.  Do not apply cold packs directly to your skin.  Please take the antibiotic as directed until finished.  I recommend that you contact your primary care provider in the next 1 to 2 days to arrange follow-up appointment.  Return to the emergency department for any new or worsening symptoms.

## 2024-10-16 ENCOUNTER — Encounter (HOSPITAL_COMMUNITY): Payer: Self-pay | Admitting: Gastroenterology

## 2024-10-16 ENCOUNTER — Ambulatory Visit (INDEPENDENT_AMBULATORY_CARE_PROVIDER_SITE_OTHER): Payer: Self-pay | Admitting: Gastroenterology

## 2024-10-16 ENCOUNTER — Telehealth (INDEPENDENT_AMBULATORY_CARE_PROVIDER_SITE_OTHER): Payer: Self-pay | Admitting: Gastroenterology

## 2024-10-16 LAB — SURGICAL PATHOLOGY

## 2024-10-16 NOTE — Telephone Encounter (Signed)
 Patient just had colonoscopy and no overt colitis was seen on high quality colonoscopy .  If patient is not having any diarrhea we can monitor for now

## 2024-10-16 NOTE — Telephone Encounter (Signed)
 Pt daughter Butler left voicemail stating that pt fell yesterday and ended up in ER. Pt had CT done and daughter stated provider told them patient had colitis. Pt is having some diarrhea. Went over TCS results with daughter. ER advised patient and patient daughter to follow up with GI. Please advise. Thank you.   [phone]

## 2024-10-16 NOTE — ED Provider Notes (Signed)
 " [name] EMERGENCY DEPARTMENT AT [facility] Provider Note   CSN: [id number] Arrival date & time: 10/15/24  1226     Patient presents with: [name] is a 79 y.o. female.    Fall       [name] is a 79 y.o. female with past medical history of hypertension, type 2 diabetes, CKD, and breast cancer who presents to the Emergency Department via EMS for evaluation of fall while going to church.  Patient states that she was walking up the sidewalk at her church when she fell.  She is unsure if she lost consciousness.  States that she tried to get up but was unsuccessful.  She was found by someone sitting on the sidewalk with blood to her face.  She complains of pain to her left face with swelling of her cheek, neck pain and pain to the bridge of her nose.  She denies any visual changes, dizziness, headache, chest pain or shortness of breath.  She denies any pain to her chest abdomen or lower extremities.  She denies any preceding symptoms  Prior to Admission medications  Medication Sig Start Date End Date Taking? Authorizing Provider  ACCU-CHEK FASTCLIX LANCETS MISC U TO TEST QID 01/21/18   [provider]  Accu-Chek FastClix Lancets MISC Use to check blood glucose 3 times daily as directed 09/06/23     amLODipine  (NORVASC ) 10 MG tablet Take 1 tablet (10 mg total) by mouth daily. 10/10/24     aspirin  81 MG chewable tablet Chew 1 tablet (81 mg total) by mouth daily. 10/16/23   Johnson, Clanford L, MD  azithromycin  (ZITHROMAX ) 250 MG tablet Take 1 tablet (250 mg total) by mouth daily. Take first 2 tablets together, then 1 every day until finished. 10/15/24   Aisling Emigh, PA-C  bacitracin  ointment Apply 1 Application topically 2 (two) times daily. 10/15/24   Herlinda Milling, PA-C  BOOSTRIX 5-2.5-18.5 LF-MCG/0.5 injection  06/21/24   [provider]  cloNIDine  (CATAPRES  - DOSED IN MG/24 HR) 0.2 mg/24hr patch Place 1 patch (0.2 mg total) onto the skin once a  week. 10/10/24   Lavona Agent, MD  cyanocobalamin  (VITAMIN B12) 1000 MCG tablet Take 1 tablet (1,000 mcg total) by mouth daily. 10/10/24   Davonna Siad, MD  donepezil  (ARICEPT ) 5 MG tablet Take 1 tablet (5 mg total) by mouth daily. 07/29/23     escitalopram  (LEXAPRO ) 10 MG tablet Take 10 mg by mouth daily.  01/20/18   [provider]  fesoterodine  (TOVIAZ ) 8 MG TB24 tablet Take 1 tablet (8 mg total) by mouth daily. 11/22/23   McKenzie, Belvie CROME, MD  glucose blood test strip Use to check blood sugar 4 times daily 08/25/23     JANUMET  50-1000 MG tablet Take 1 tablet by mouth 2 (two) times daily. 10/05/23   [provider]  Lancets Misc. (ACCU-CHEK FASTCLIX LANCET) KIT See admin instructions.    [provider]  levothyroxine  (SYNTHROID ) 100 MCG tablet Take 1 tablet (100 mcg total) by mouth in the morning on an empty stomach. 10/10/24     levothyroxine  (SYNTHROID , LEVOTHROID) 100 MCG tablet Take 100 mcg by mouth daily.  01/21/18   [provider]  Magnesium  400 MG CAPS Take by mouth.    [provider]  olmesartan  (BENICAR ) 40 MG tablet Take 40 mg by mouth daily.    [provider]  olmesartan  (BENICAR ) 40 MG tablet Take 1 tablet (40 mg total) by  mouth daily. 10/10/24     ondansetron  (ZOFRAN ) 4 MG tablet Take 4 mg by mouth every 8 (eight) hours as needed for nausea or vomiting.    [provider]  pantoprazole  (PROTONIX ) 40 MG tablet Take 1 tablet (40 mg total) by mouth daily at 6 (six) AM. 06/19/24   Ahmed, [name], MD  PREVIDENT 5000 DRY MOUTH 1.1 % GEL dental gel Place 1 Application onto teeth at bedtime. 09/30/23   [provider]  simvastatin  (ZOCOR ) 40 MG tablet Take 1 tablet (40 mg total) by mouth every evening. 10/10/24     sitaGLIPtin -metformin  (JANUMET ) 50-1000 MG tablet Take 1 tablet by mouth 2 (two) times daily. 08/24/24       Allergies: Patient has no known allergies.    Review of Systems  HENT:  Positive  for facial swelling.   Musculoskeletal:  Positive for neck pain.  All other systems reviewed and are negative.   Updated Vital Signs BP (!) 166/79   Pulse 69   Temp 97.6 F (36.4 C) (Oral)   Resp 19   Ht 5' 2 (1.575 m)   Wt 54 kg   SpO2 99%   BMI 21.77 kg/m   Physical Exam Vitals and nursing note reviewed.  Constitutional:      Appearance: She is not toxic-appearing.  HENT:     Head:     Jaw: There is normal jaw occlusion. No trismus, pain on movement or malocclusion.     Comments: Large abrasion to the left cheek there is some periorbital swelling and bruising as well as bruising and abrasion along the mid upper lip.  No active bleeding.    Nose:     Comments: Mild edema to the bridge of the nose.  Slight bruising noted.  No epistaxis, no septal hematoma    Mouth/Throat:     Mouth: Mucous membranes are moist.     Pharynx: Oropharynx is clear.  Eyes:     Extraocular Movements: Extraocular movements intact.     Conjunctiva/sclera: Conjunctivae normal.     Pupils: Pupils are equal, round, and reactive to light.  Neck:     Trachea: Phonation normal.     Comments: Tender to palpation at the base of the cervical spine.  I do not appreciate any abrasions ecchymosis or bony step-off. Cardiovascular:     Rate and Rhythm: Normal rate and regular rhythm.     Pulses: Normal pulses.  Pulmonary:     Effort: Pulmonary effort is normal.     Comments: No abrasions or ecchymosis of the chest wall Chest:     Chest wall: No tenderness.  Abdominal:     Palpations: Abdomen is soft.     Tenderness: There is no abdominal tenderness.  Musculoskeletal:        General: Normal range of motion.     Cervical back: Normal range of motion. Tenderness present. Spinous process tenderness present.  Skin:    General: Skin is warm.     Capillary Refill: Capillary refill takes less than 2 seconds.  Neurological:     General: No focal deficit present.     Mental Status: She is alert.     Cranial  Nerves: Cranial nerves 2-12 are intact.     Sensory: Sensation is intact. No sensory deficit.     Motor: Motor function is intact. No weakness, tremor or abnormal muscle tone.     Coordination: Coordination is intact.     (all labs ordered are listed, but only  abnormal results are displayed) Labs Reviewed  CBC WITH DIFFERENTIAL/PLATELET - Abnormal; Notable for the following components:      Result Value   RBC 3.46 (*)    Hemoglobin 11.0 (*)    HCT 33.0 (*)    All other components within normal limits  COMPREHENSIVE METABOLIC PANEL WITH GFR - Abnormal; Notable for the following components:   Glucose, Bld 178 (*)    BUN 24 (*)    Creatinine, Ser 1.65 (*)    Total Protein 6.3 (*)    GFR, Estimated 31 (*)    All other components within normal limits  URINALYSIS, ROUTINE W REFLEX MICROSCOPIC - Abnormal; Notable for the following components:   Color, Urine STRAW (*)    All other components within normal limits  CBG MONITORING, ED - Abnormal; Notable for the following components:   Glucose-Capillary 180 (*)    All other components within normal limits    EKG: EKG Interpretation Date/Time:  Sunday October 15 2024 13:48:40 EST Ventricular Rate:  71 PR Interval:  164 QRS Duration:  94 QT Interval:  408 QTC Calculation: 444 R Axis:   50  Text Interpretation: Sinus rhythm Anterior infarct, old Confirmed by Cleotilde Rogue ([id number]) on 10/15/2024 1:51:07 PM  Radiology: CT CHEST ABDOMEN PELVIS WO CONTRAST Result Date: 10/15/2024 CLINICAL DATA:  Fall EXAM: CT CHEST, ABDOMEN AND PELVIS WITHOUT CONTRAST TECHNIQUE: Multidetector CT imaging of the chest, abdomen and pelvis was performed following the standard protocol without IV contrast. RADIATION DOSE REDUCTION: This exam was performed according to the departmental dose-optimization program which includes automated exposure control, adjustment of the mA and/or kV according to patient size and/or use of iterative reconstruction technique.  COMPARISON:  December 07, 2023.  December 03, 2022. FINDINGS: CT CHEST FINDINGS Cardiovascular: Normal cardiac size. Minimal pericardial effusion. Coronary artery calcifications are noted. Aortic atherosclerosis. Mediastinum/Nodes: Moderate size sliding-type hiatal hernia is noted. Thyroid  gland is unremarkable. No definite adenopathy is noted in mediastinum or axilla although imaging is limited due to lack of intravenous contrast. Lungs/Pleura: No pneumothorax or pleural effusion is noted. Stable minimal bilateral upper lobe scarring is noted. Multiple small nodules are noted laterally in left upper lobe adjacent to thick walled bronchus with tree-in-bud appearance suggesting infection, potentially mycobacterium. Extrapleural fat collection is noted laterally in right chest which is stable compared to prior exam. Musculoskeletal: No chest wall mass or suspicious bone lesions identified. CT ABDOMEN PELVIS FINDINGS Hepatobiliary: No focal liver abnormality is seen. No gallstones, gallbladder wall thickening, or biliary dilatation. Pancreas: Unremarkable. No pancreatic ductal dilatation or surrounding inflammatory changes. Spleen: Normal in size without focal abnormality. Adrenals/Urinary Tract: Adrenal glands are unremarkable. Kidneys are normal, without renal calculi, focal lesion, or hydronephrosis. Bladder is unremarkable. Stomach/Bowel: The stomach is unremarkable. The appendix appears normal. There is no evidence of bowel obstruction. However, there is mild to moderate wall thickening ascending and transverse colon concerning for infectious or inflammatory colitis. Mild wall thickening and stool is noted in the descending colon also concerning for infectious or inflammatory colitis. Vascular/Lymphatic: Aortic atherosclerosis. No enlarged abdominal or pelvic lymph nodes. Reproductive: Status post hysterectomy. No adnexal masses. Other: No abdominal wall hernia or abnormality. No abdominopelvic ascites.  Musculoskeletal: No acute or significant osseous findings. IMPRESSION: 1. Multiple small nodules are noted laterally in left upper lobe adjacent to thick walled bronchus with tree-in-bud appearance suggesting infection, potentially Mycobacterium. Follow-up unenhanced chest CT in 2-3 months is recommended to ensure stability or resolution. 2. Moderate size sliding-type hiatal hernia. 3. Minimal  pericardial effusion. 4. Coronary artery calcifications are noted. 5. Mild to moderate wall thickening is noted in the ascending and transverse colon concerning for infectious or inflammatory colitis. Mild wall thickening and stool is noted in the descending colon also concerning for infectious or inflammatory colitis. 6. Aortic atherosclerosis. Aortic Atherosclerosis (ICD10-I70.0). Electronically Signed   By: Lynwood Landy Raddle M.D.   On: 10/15/2024 13:57   CT Maxillofacial Wo Contrast Result Date: 10/15/2024 EXAM: CT Face without contrast 10/15/2024 01:30:32 PM TECHNIQUE: CT of the face was performed without the administration of intravenous contrast. Multiplanar reformatted images are provided for review. Automated exposure control, iterative reconstruction, and/or weight based adjustment of the mA/kV was utilized to reduce the radiation dose to as low as reasonably achievable. COMPARISON: None available CLINICAL HISTORY: Facial trauma, blunt. Unremarkable fall. FINDINGS: AERODIGESTIVE TRACT: No mass. No edema. SALIVARY GLANDS: No acute abnormality. LYMPH NODES: No suspicious cervical lymphadenopathy. SOFT TISSUES: Left periorbital and facial soft tissue swelling and hematoma is present. No underlying fracture or foreign body is present. BRAIN, ORBITS AND SINUSES: Left globe is intact. Bilateral lens replacements are noted. Atherosclerotic calcifications are present in the cavernous carotid arteries bilaterally. No hyperdense vessel is present. Atherosclerotic calcifications are present at the carotid bifurcations  bilaterally. No definite stenosis is present. No acute abnormality in the brain or sinuses. BONES: Degenerative changes are present in the upper cervical spine. No acute abnormality. No suspicious bone lesion. IMPRESSION: 1. Left periorbital and facial soft tissue swelling and hematoma, without underlying fracture or foreign body. Electronically signed by: Lonni Necessary MD 10/15/2024 01:47 PM EST RP Workstation: [id number]   CT Cervical Spine Wo Contrast Result Date: 10/15/2024 EXAM: CT CERVICAL SPINE WITHOUT CONTRAST 10/15/2024 01:30:32 PM TECHNIQUE: CT of the cervical spine was performed without the administration of intravenous contrast. Multiplanar reformatted images are provided for review. Automated exposure control, iterative reconstruction, and/or weight based adjustment of the mA/kV was utilized to reduce the radiation dose to as low as reasonably achievable. COMPARISON: None available. CLINICAL HISTORY: Neck trauma (Age >= 65y). Unremarkable fall. FINDINGS: BONES AND ALIGNMENT: No acute fracture or traumatic malalignment. Slight degenerative anterolisthesis is present at C4-C5. DEGENERATIVE CHANGES: A central calcified disc protrusion effaces the ventral CSF at C3-C4. Uncovertebral spurring contributes to moderate left foraminal stenosis at C5-C6. Multilevel facet degenerative changes are present. SOFT TISSUES: No prevertebral soft tissue swelling. Atherosclerotic calcifications are present at the carotid bifurcations bilaterally. IMPRESSION: 1. No evidence of acute traumatic injury. 2. Central calcified disc protrusion at C3-C4 effacing the ventral CSF. Electronically signed by: Lonni Necessary MD 10/15/2024 01:45 PM EST RP Workstation: [id number]   CT Head Wo Contrast Result Date: 10/15/2024 EXAM: CT HEAD WITHOUT CONTRAST 10/15/2024 01:30:32 PM TECHNIQUE: CT of the head was performed without the administration of intravenous contrast. Automated exposure control, iterative reconstruction,  and/or weight based adjustment of the mA/kV was utilized to reduce the radiation dose to as low as reasonably achievable. COMPARISON: CT head without contrast 07/30/2020. Mild atrophy and white matter changes are stable, within normal limits for age. CLINICAL HISTORY: Head trauma, minor (Age >= 65y). Unremarkable fall. Possible syncopal episode. FINDINGS: BRAIN AND VENTRICLES: No acute hemorrhage. No evidence of acute infarct. No hydrocephalus. No extra-axial collection. No mass effect or midline shift. Mild diffuse atrophy. Atherosclerotic calcifications within the cavernous internal carotid arteries. ORBITS: Bilateral lens replacements are noted. The globes and orbits are otherwise within normal limits. SINUSES: No acute abnormality. SOFT TISSUES AND SKULL: Left periorbital soft tissue swelling. No skull fracture.  IMPRESSION: 1. No acute intracranial abnormality. 2. Left periorbital soft tissue swelling. Electronically signed by: Lonni Necessary MD 10/15/2024 01:41 PM EST RP Workstation: [id number]     Procedures   Medications Ordered in the ED  acetaminophen  (TYLENOL ) tablet 650 mg ( Oral Canceled Entry 10/15/24 1502)  azithromycin  (ZITHROMAX ) tablet 500 mg (500 mg Oral Given 10/15/24 1624)                                    Medical Decision Making    Patient here for evaluation brought in by EMS.  States that she fell on the sidewalk while going to church.  She is unsure of loss of consciousness.  She complains of pain to her left face and neck.  She denies any visual changes, pain of her mid or lower back, chest abdomen, or lower extremities.  No headache or dizziness nausea or vomiting.  She does not take blood thinners.  On exam, patient is stable appearing, vital signs reassuring.  She has a large abrasion to her left cheek area and some periorbital swelling on the left and swelling of her upper lip.  I do not appreciate any dental injury or malocclusion.  She is mentating well and I do  not appreciate any neurologic deficits  Amount and/or Complexity of Data Reviewed Labs: ordered.    Details: Labs no evidence of leukocytosis, kidney function At baseline Radiology: ordered.    Details: CT imaging of the head, C-spine, and maxillofacial without acute bony injury.  CT imaging of the chest abdomen and pelvis show nodules within the left upper lobe with wall thickening of the bronchus suggesting infection possibly Mycobacterium.  Wall thickening in the ascending and transverse colon's concerning for colitis ECG/medicine tests: ordered.    Details: EKG shows sinus rhythm old anterior infarct Discussion of management or test interpretation with external provider(s):   Patient's wounds were cleaned and bandaged.  Her tetanus is up-to-date.  CT imaging without acute bony findings.  Her daughter is at bedside discussed findings.  Given CT findings of the chest, I will prescribe macrolide, CT suggesting's of colitis thought to be more inflammatory.  Recommend symptomatic treatment for her wounds, close outpatient follow-up early this week with her PCP.  She was given strict return precautions.  Risk OTC drugs. Prescription drug management.         Final diagnoses:  Fall, initial encounter  Contusion of face, initial encounter  Abrasion  Acute cough    ED Discharge Orders          Ordered    azithromycin  (ZITHROMAX ) 250 MG tablet  Daily,   Status:  Discontinued        10/15/24 1603    bacitracin  ointment  2 times daily,   Status:  Discontinued        10/15/24 1603    azithromycin  (ZITHROMAX ) 250 MG tablet  Daily        10/15/24 1621    bacitracin  ointment  2 times daily        10/15/24 1621               Lewis Keats, [place], PA-C 10/16/24 1611    Cleotilde Rogue, MD 10/20/24 2249  "

## 2024-10-17 NOTE — Progress Notes (Signed)
 3 yr TCS noted in recall Patient result letter mailed procedure note and pathology result faxed to PCP

## 2024-10-17 NOTE — Telephone Encounter (Signed)
Left message for [name] to return call.

## 2024-10-17 NOTE — Telephone Encounter (Signed)
 Pt daughter left voicemail returning call. Returned call to daughter and advised if patient is not having diarrhea we can monitor her for now. Daughter asked patient and she is not having diarrhea; also not having any cramping/pain. Advised that we could monitor her for now but to call if patient began to have symptoms. Pt daughter verbalized understanding.   Pt is due for follow up in February. Can we get her scheduled once we have Dr.Ahmed February schedule? Thank you!

## 2024-10-18 ENCOUNTER — Other Ambulatory Visit (HOSPITAL_BASED_OUTPATIENT_CLINIC_OR_DEPARTMENT_OTHER): Payer: Self-pay

## 2024-10-18 MED ORDER — GLUCOSE BLOOD VI STRP
ORAL_STRIP | 1 refills | Status: AC
  Filled 2024-10-18: qty 300, 75d supply, fill #0

## 2024-10-18 MED ORDER — MUPIROCIN 2 % EX OINT
TOPICAL_OINTMENT | CUTANEOUS | 0 refills | Status: AC
  Filled 2024-10-18: qty 22, 30d supply, fill #0

## 2024-10-18 MED ORDER — CLOBETASOL PROPIONATE 0.05 % EX SHAM
MEDICATED_SHAMPOO | CUTANEOUS | 3 refills | Status: AC
  Filled 2024-10-18 (×2): qty 118, 30d supply, fill #0
  Filled 2024-11-11: qty 118, 30d supply, fill #1

## 2024-10-19 ENCOUNTER — Inpatient Hospital Stay: Attending: Oncology

## 2024-10-19 ENCOUNTER — Other Ambulatory Visit (HOSPITAL_BASED_OUTPATIENT_CLINIC_OR_DEPARTMENT_OTHER): Payer: Self-pay

## 2024-10-19 ENCOUNTER — Inpatient Hospital Stay

## 2024-10-19 VITALS — BP 137/71 | HR 64 | Temp 98.4°F | Resp 16

## 2024-10-19 DIAGNOSIS — D649 Anemia, unspecified: Secondary | ICD-10-CM

## 2024-10-19 DIAGNOSIS — N185 Chronic kidney disease, stage 5: Secondary | ICD-10-CM

## 2024-10-19 DIAGNOSIS — D631 Anemia in chronic kidney disease: Secondary | ICD-10-CM

## 2024-10-19 LAB — CBC
HCT: 30.7 % — ABNORMAL LOW (ref 36.0–46.0)
Hemoglobin: 10.3 g/dL — ABNORMAL LOW (ref 12.0–15.0)
MCH: 32.1 pg (ref 26.0–34.0)
MCHC: 33.6 g/dL (ref 30.0–36.0)
MCV: 95.6 fL (ref 80.0–100.0)
Platelets: 295 K/uL (ref 150–400)
RBC: 3.21 MIL/uL — ABNORMAL LOW (ref 3.87–5.11)
RDW: 12.8 % (ref 11.5–15.5)
WBC: 9.2 K/uL (ref 4.0–10.5)
nRBC: 0 % (ref 0.0–0.2)

## 2024-10-19 MED ORDER — EPOETIN ALFA-EPBX 10000 UNIT/ML IJ SOLN
10000.0000 [IU] | Freq: Once | INTRAMUSCULAR | Status: AC
  Administered 2024-10-19: 10000 [IU] via SUBCUTANEOUS
  Filled 2024-10-19: qty 1

## 2024-10-19 MED ORDER — ESCITALOPRAM OXALATE 10 MG PO TABS
10.0000 mg | ORAL_TABLET | Freq: Every day | ORAL | 3 refills | Status: AC
  Filled 2024-10-19 – 2024-11-15 (×5): qty 90, 90d supply, fill #0

## 2024-10-19 NOTE — Patient Instructions (Signed)

## 2024-10-19 NOTE — Progress Notes (Signed)
Patient presents today for Retacrit injection. Hemoglobin reviewed prior to administration. VSS tolerated without incident or complaint. See MAR for details. Patient stable during and after injection. Patient discharged in satisfactory condition with no s/s of distress noted.  

## 2024-10-20 ENCOUNTER — Other Ambulatory Visit (HOSPITAL_BASED_OUTPATIENT_CLINIC_OR_DEPARTMENT_OTHER): Payer: Self-pay

## 2024-10-23 ENCOUNTER — Other Ambulatory Visit: Payer: Self-pay

## 2024-10-24 ENCOUNTER — Other Ambulatory Visit (HOSPITAL_BASED_OUTPATIENT_CLINIC_OR_DEPARTMENT_OTHER): Payer: Self-pay

## 2024-10-24 MED ORDER — JANUMET 50-1000 MG PO TABS
1.0000 | ORAL_TABLET | Freq: Two times a day (BID) | ORAL | 11 refills | Status: AC
  Filled 2024-10-24: qty 60, 30d supply, fill #0

## 2024-10-30 ENCOUNTER — Other Ambulatory Visit (HOSPITAL_BASED_OUTPATIENT_CLINIC_OR_DEPARTMENT_OTHER): Payer: Self-pay

## 2024-10-31 ENCOUNTER — Other Ambulatory Visit (HOSPITAL_BASED_OUTPATIENT_CLINIC_OR_DEPARTMENT_OTHER): Payer: Self-pay

## 2024-10-31 ENCOUNTER — Other Ambulatory Visit: Payer: Self-pay

## 2024-11-02 ENCOUNTER — Other Ambulatory Visit (HOSPITAL_BASED_OUTPATIENT_CLINIC_OR_DEPARTMENT_OTHER): Payer: Self-pay

## 2024-11-02 MED ORDER — LEVOTHYROXINE SODIUM 112 MCG PO TABS
112.0000 ug | ORAL_TABLET | Freq: Every day | ORAL | 3 refills | Status: AC
  Filled 2024-11-02: qty 90, 90d supply, fill #0

## 2024-11-03 ENCOUNTER — Other Ambulatory Visit: Payer: Self-pay

## 2024-11-03 ENCOUNTER — Other Ambulatory Visit (HOSPITAL_BASED_OUTPATIENT_CLINIC_OR_DEPARTMENT_OTHER): Payer: Self-pay

## 2024-11-08 ENCOUNTER — Other Ambulatory Visit: Payer: Self-pay

## 2024-11-08 DIAGNOSIS — D649 Anemia, unspecified: Secondary | ICD-10-CM

## 2024-11-09 ENCOUNTER — Inpatient Hospital Stay

## 2024-11-09 ENCOUNTER — Institutional Professional Consult (permissible substitution) (HOSPITAL_BASED_OUTPATIENT_CLINIC_OR_DEPARTMENT_OTHER): Admitting: Family

## 2024-11-14 ENCOUNTER — Other Ambulatory Visit (HOSPITAL_BASED_OUTPATIENT_CLINIC_OR_DEPARTMENT_OTHER): Payer: Self-pay

## 2024-11-15 ENCOUNTER — Encounter: Payer: Self-pay | Admitting: Oncology

## 2024-11-15 ENCOUNTER — Other Ambulatory Visit (HOSPITAL_BASED_OUTPATIENT_CLINIC_OR_DEPARTMENT_OTHER): Payer: Self-pay

## 2024-11-22 ENCOUNTER — Ambulatory Visit: Payer: Federal, State, Local not specified - PPO | Admitting: Urology

## 2024-11-22 ENCOUNTER — Ambulatory Visit (INDEPENDENT_AMBULATORY_CARE_PROVIDER_SITE_OTHER): Admitting: Gastroenterology

## 2024-11-30 ENCOUNTER — Inpatient Hospital Stay

## 2024-12-21 ENCOUNTER — Inpatient Hospital Stay

## 2024-12-21 ENCOUNTER — Inpatient Hospital Stay: Admitting: Oncology

## 2024-12-25 ENCOUNTER — Institutional Professional Consult (permissible substitution) (HOSPITAL_BASED_OUTPATIENT_CLINIC_OR_DEPARTMENT_OTHER): Admitting: Family
